# Patient Record
Sex: Female | Born: 1990 | Race: White | Hispanic: No | Marital: Married | Smoking: Never smoker
Health system: Southern US, Community
[De-identification: ages and names within clinical notes are randomized; demographics above are authoritative.]

## PROBLEM LIST (undated history)

## (undated) ENCOUNTER — Inpatient Hospital Stay: Payer: Self-pay

## (undated) DIAGNOSIS — J45909 Unspecified asthma, uncomplicated: Secondary | ICD-10-CM

## (undated) DIAGNOSIS — F329 Major depressive disorder, single episode, unspecified: Secondary | ICD-10-CM

## (undated) DIAGNOSIS — F32A Depression, unspecified: Secondary | ICD-10-CM

## (undated) DIAGNOSIS — F419 Anxiety disorder, unspecified: Secondary | ICD-10-CM

## (undated) DIAGNOSIS — T7840XA Allergy, unspecified, initial encounter: Secondary | ICD-10-CM

## (undated) HISTORY — DX: Allergy, unspecified, initial encounter: T78.40XA

## (undated) HISTORY — DX: Anxiety disorder, unspecified: F41.9

## (undated) HISTORY — DX: Major depressive disorder, single episode, unspecified: F32.9

## (undated) HISTORY — PX: NO PAST SURGERIES: SHX2092

## (undated) HISTORY — PX: ADENOIDECTOMY: SUR15

## (undated) HISTORY — DX: Unspecified asthma, uncomplicated: J45.909

## (undated) HISTORY — PX: WISDOM TOOTH EXTRACTION: SHX21

## (undated) HISTORY — DX: Depression, unspecified: F32.A

---
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
## 2012-01-09 ENCOUNTER — Observation Stay: Payer: Self-pay | Admitting: Obstetrics and Gynecology

## 2012-01-23 ENCOUNTER — Inpatient Hospital Stay: Payer: Self-pay

## 2012-01-23 LAB — CBC WITH DIFFERENTIAL/PLATELET
Basophil #: 0 10*3/uL (ref 0.0–0.1)
HCT: 38.3 % (ref 35.0–47.0)
HGB: 13 g/dL (ref 12.0–16.0)
Lymphocyte %: 7.4 %
MCH: 31.8 pg (ref 26.0–34.0)
Monocyte #: 1 x10 3/mm — ABNORMAL HIGH (ref 0.2–0.9)
Monocyte %: 6.8 %
Neutrophil #: 12.5 10*3/uL — ABNORMAL HIGH (ref 1.4–6.5)
Neutrophil %: 85.1 %
Platelet: 129 10*3/uL — ABNORMAL LOW (ref 150–440)
RDW: 13.4 % (ref 11.5–14.5)
WBC: 14.7 10*3/uL — ABNORMAL HIGH (ref 3.6–11.0)

## 2012-01-24 LAB — HEMATOCRIT: HCT: 33.9 % — ABNORMAL LOW (ref 35.0–47.0)

## 2012-01-25 LAB — CBC WITH DIFFERENTIAL/PLATELET
Basophil #: 0 10*3/uL (ref 0.0–0.1)
Eosinophil %: 1 %
HCT: 31.5 % — ABNORMAL LOW (ref 35.0–47.0)
Lymphocyte #: 1.7 10*3/uL (ref 1.0–3.6)
MCH: 33.1 pg (ref 26.0–34.0)
MCHC: 35.5 g/dL (ref 32.0–36.0)
MCV: 93 fL (ref 80–100)
Monocyte #: 1 x10 3/mm — ABNORMAL HIGH (ref 0.2–0.9)
Monocyte %: 6.9 %
Neutrophil #: 11 10*3/uL — ABNORMAL HIGH (ref 1.4–6.5)
Platelet: 120 10*3/uL — ABNORMAL LOW (ref 150–440)
RBC: 3.38 10*6/uL — ABNORMAL LOW (ref 3.80–5.20)
WBC: 13.9 10*3/uL — ABNORMAL HIGH (ref 3.6–11.0)

## 2013-07-21 ENCOUNTER — Observation Stay: Payer: Self-pay

## 2013-09-01 ENCOUNTER — Observation Stay: Payer: Self-pay | Admitting: Obstetrics and Gynecology

## 2013-09-02 ENCOUNTER — Inpatient Hospital Stay: Payer: Self-pay | Admitting: Obstetrics and Gynecology

## 2013-09-02 LAB — CBC WITH DIFFERENTIAL/PLATELET
BASOS ABS: 0 10*3/uL (ref 0.0–0.1)
Basophil %: 0.2 %
Eosinophil #: 0.1 10*3/uL (ref 0.0–0.7)
Eosinophil %: 0.7 %
HCT: 40.1 % (ref 35.0–47.0)
HGB: 13.4 g/dL (ref 12.0–16.0)
Lymphocyte #: 1.5 10*3/uL (ref 1.0–3.6)
Lymphocyte %: 9.9 %
MCH: 29.9 pg (ref 26.0–34.0)
MCHC: 33.4 g/dL (ref 32.0–36.0)
MCV: 90 fL (ref 80–100)
MONO ABS: 1.2 x10 3/mm — AB (ref 0.2–0.9)
Monocyte %: 8.3 %
Neutrophil #: 12.2 10*3/uL — ABNORMAL HIGH (ref 1.4–6.5)
Neutrophil %: 80.9 %
PLATELETS: 162 10*3/uL (ref 150–440)
RBC: 4.49 10*6/uL (ref 3.80–5.20)
RDW: 13.2 % (ref 11.5–14.5)
WBC: 15.1 10*3/uL — ABNORMAL HIGH (ref 3.6–11.0)

## 2013-09-03 LAB — HEMATOCRIT: HCT: 35.6 % (ref 35.0–47.0)

## 2014-07-03 DIAGNOSIS — O364XX0 Maternal care for intrauterine death, not applicable or unspecified: Secondary | ICD-10-CM | POA: Diagnosis not present

## 2014-07-03 DIAGNOSIS — Z3492 Encounter for supervision of normal pregnancy, unspecified, second trimester: Secondary | ICD-10-CM | POA: Diagnosis not present

## 2014-09-12 ENCOUNTER — Telehealth: Payer: Self-pay

## 2014-09-12 NOTE — Telephone Encounter (Signed)
Patient is complaining of a yeast infection under her left breast. It's clearing up a little painful. I stated to the patient she may need to come in for a office visit. Patient stated she just want to know what she can use over the counter. Please call patient at [phone]

## 2014-09-16 NOTE — Telephone Encounter (Signed)
Left msg with pt. She can try monistat topical otc fro pharmacy. Advised her if rash is worsening or if this does not help that she needs to come in to be evaluated.

## 2014-09-22 ENCOUNTER — Ambulatory Visit: Payer: Self-pay | Admitting: Family Medicine

## 2014-10-11 ENCOUNTER — Ambulatory Visit (INDEPENDENT_AMBULATORY_CARE_PROVIDER_SITE_OTHER): Payer: Managed Care, Other (non HMO) | Admitting: Family Medicine

## 2014-10-11 ENCOUNTER — Encounter: Payer: Self-pay | Admitting: Family Medicine

## 2014-10-11 VITALS — BP 103/65 | HR 63 | Temp 97.5°F | Resp 16 | Ht 67.5 in | Wt 149.0 lb

## 2014-10-11 DIAGNOSIS — F329 Major depressive disorder, single episode, unspecified: Secondary | ICD-10-CM

## 2014-10-11 DIAGNOSIS — F32A Depression, unspecified: Secondary | ICD-10-CM

## 2014-10-11 DIAGNOSIS — F418 Other specified anxiety disorders: Secondary | ICD-10-CM | POA: Diagnosis not present

## 2014-10-11 DIAGNOSIS — Z131 Encounter for screening for diabetes mellitus: Secondary | ICD-10-CM | POA: Diagnosis not present

## 2014-10-11 DIAGNOSIS — Z0000 Encounter for general adult medical examination without abnormal findings: Secondary | ICD-10-CM

## 2014-10-11 DIAGNOSIS — F419 Anxiety disorder, unspecified: Secondary | ICD-10-CM

## 2014-10-11 LAB — COMPREHENSIVE METABOLIC PANEL
ALBUMIN: 4.5 g/dL (ref 3.5–5.2)
ALT: 14 U/L (ref 0–35)
AST: 17 U/L (ref 0–37)
Alkaline Phosphatase: 80 U/L (ref 39–117)
BILIRUBIN TOTAL: 0.5 mg/dL (ref 0.2–1.2)
BUN: 11 mg/dL (ref 6–23)
CALCIUM: 9.5 mg/dL (ref 8.4–10.5)
CO2: 28 meq/L (ref 19–32)
Chloride: 104 mEq/L (ref 96–112)
Creat: 0.68 mg/dL (ref 0.50–1.10)
Glucose, Bld: 81 mg/dL (ref 70–99)
Potassium: 4.2 mEq/L (ref 3.5–5.3)
SODIUM: 141 meq/L (ref 135–145)
Total Protein: 6.9 g/dL (ref 6.0–8.3)

## 2014-10-11 LAB — CBC WITH DIFFERENTIAL/PLATELET
Basophils Absolute: 0.1 10*3/uL (ref 0.0–0.1)
Basophils Relative: 1 % (ref 0–1)
EOS ABS: 0.1 10*3/uL (ref 0.0–0.7)
Eosinophils Relative: 2 % (ref 0–5)
HCT: 41.8 % (ref 36.0–46.0)
HEMOGLOBIN: 14.5 g/dL (ref 12.0–15.0)
LYMPHS PCT: 21 % (ref 12–46)
Lymphs Abs: 1.3 10*3/uL (ref 0.7–4.0)
MCH: 29.3 pg (ref 26.0–34.0)
MCHC: 34.7 g/dL (ref 30.0–36.0)
MCV: 84.4 fL (ref 78.0–100.0)
MPV: 10.3 fL (ref 8.6–12.4)
Monocytes Absolute: 0.6 10*3/uL (ref 0.1–1.0)
Monocytes Relative: 9 % (ref 3–12)
NEUTROS PCT: 67 % (ref 43–77)
Neutro Abs: 4.2 10*3/uL (ref 1.7–7.7)
Platelets: 200 10*3/uL (ref 150–400)
RBC: 4.95 MIL/uL (ref 3.87–5.11)
RDW: 13.6 % (ref 11.5–15.5)
WBC: 6.2 10*3/uL (ref 4.0–10.5)

## 2014-10-11 LAB — VITAMIN B12: Vitamin B-12: 683 pg/mL (ref 211–911)

## 2014-10-11 LAB — HEMOGLOBIN A1C
Hgb A1c MFr Bld: 5.4 % (ref <5.7)
Mean Plasma Glucose: 108 mg/dL (ref <117)

## 2014-10-11 LAB — TSH: TSH: 1.586 u[IU]/mL (ref 0.350–4.500)

## 2014-10-11 NOTE — Progress Notes (Signed)
Subjective:    Patient ID: Mallory AnonJacqueline Craig, female    DOB: 1990/10/09, 24 y.o.   MRN: [id number]  10/11/2014  Establish Care; Annual Exam; and Anxiety   HPI This 24 y.o. female presents to establish care.  Last physical:  2015 Pap smear: 2015 Mammogram: never Colonoscopy:  never TDAP:  2013 Gardisil series: as teenager Influenza:  2014 Eye exam:  2015: +glasses Dental exam:  2015  Anxiety and depression: [facility]; OB/GYN moved.  Not a fan of Deatra RobinsonKaren Jones.  No SI/HI.  Sleeping well.  Recently decreased Zoloft from 150mg  to 100mg  two weeks ago.  Still has some bad days when argues with husband and gets upset more than she feels she should.  Denies SI/HI.  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  No local family support.  Financial strains.  Allergic Rhinitis: taking Claritin 10mg  daily. Currently breastfeeding.    Contraception:  Not interested in contraception currently.  Breastfeeding currently [age] year old.  Previous contraception nothing; condoms.       Review of Systems  Constitutional: Negative for fever, chills, diaphoresis, activity change, appetite change, fatigue and unexpected weight change.  HENT: Positive for congestion. Negative for dental problem, drooling, ear discharge, ear pain, facial swelling, hearing loss, mouth sores, nosebleeds, postnasal drip, rhinorrhea, sinus pressure, sneezing, sore throat, tinnitus, trouble swallowing and voice change.   Eyes: Negative for photophobia, pain, discharge, redness, itching and visual disturbance.  Respiratory: Negative for apnea, cough, choking, chest tightness, shortness of breath, wheezing and stridor.   Cardiovascular: Negative for chest pain, palpitations and leg swelling.  Gastrointestinal: Negative for nausea, vomiting, abdominal pain, diarrhea, constipation, blood in stool, abdominal distention, anal bleeding and rectal pain.  Endocrine: Negative for cold intolerance, heat  intolerance, polydipsia, polyphagia and polyuria.  Genitourinary: Negative for dysuria, urgency, frequency, hematuria, flank pain, decreased urine volume, vaginal bleeding, vaginal discharge, enuresis, difficulty urinating, genital sores, vaginal pain, menstrual problem, pelvic pain and dyspareunia.  Musculoskeletal: Negative for myalgias, back pain, joint swelling, arthralgias, gait problem, neck pain and neck stiffness.  Skin: Negative for color change, pallor, rash and wound.  Allergic/Immunologic: Negative for environmental allergies, food allergies and immunocompromised state.  Neurological: Negative for dizziness, tremors, seizures, syncope, facial asymmetry, speech difficulty, weakness, light-headedness, numbness and headaches.  Hematological: Negative for adenopathy. Does not bruise/bleed easily.  Psychiatric/Behavioral: Negative for suicidal ideas, hallucinations, behavioral problems, confusion, sleep disturbance, self-injury, dysphoric mood, decreased concentration and agitation. The patient is nervous/anxious. The patient is not hyperactive.     Past Medical History  Diagnosis Date  . Anxiety   . Allergy     Claritin  . 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 Asthma     Childhood; last Albuterol use age 24   Past Surgical History  Procedure Laterality Date  . Adenoidectomy     No Known Allergies Current Outpatient Prescriptions  Medication Sig Dispense Refill  . loratadine (CLARITIN) 10 MG tablet Take 10 mg by mouth daily.    . [DISCONTINUED] sertraline (ZOLOFT) 100 MG tablet Take 100 mg by mouth daily.     No current facility-administered medications for this visit.       Objective:    BP 103/65 mmHg  Pulse 63  Temp(Src) 97.5 F (36.4 C)  Resp 16  Ht 5' 7.5" (1.715 m)  Wt 149 lb (67.586 kg)  BMI 22.98 kg/m2 Physical Exam  Constitutional: She is oriented to person, place, and time. She appears  well-developed and well-nourished. No distress.    HENT:  Head: Normocephalic and atraumatic.  Right Ear: External ear normal.  Left Ear: External ear normal.  Nose: Nose normal.  Mouth/Throat: Oropharynx is clear and moist.  Eyes: Conjunctivae and EOM are normal. Pupils are equal, round, and reactive to light.  Neck: Normal range of motion and full passive range of motion without pain. Neck supple. No JVD present. Carotid bruit is not present. No thyromegaly present.  Cardiovascular: Normal rate, regular rhythm, normal heart sounds and intact distal pulses.  Exam reveals no gallop and no friction rub.   No murmur heard. Pulmonary/Chest: Effort normal and breath sounds normal. She has no wheezes. She has no rales.  Abdominal: Soft. Bowel sounds are normal. She exhibits no distension and no mass. There is no tenderness. There is no rebound and no guarding.  Musculoskeletal:       Right shoulder: Normal.       Left shoulder: Normal.       Cervical back: Normal.  Lymphadenopathy:    She has no cervical adenopathy.  Neurological: She is alert and oriented to person, place, and time. She has normal reflexes. No cranial nerve deficit. She exhibits normal muscle tone. Coordination normal.  Skin: Skin is warm and dry. No rash noted. She is not diaphoretic. No erythema. No pallor.  Psychiatric: She has a normal mood and affect. Her behavior is normal. Judgment and thought content normal.  Nursing note and vitals reviewed.       Assessment & Plan:   1. Routine physical examination   2. Screening for diabetes mellitus   3. Anxiety and depression     1. Complete Physical Examination: anticipatory guidance --- exercise, weight maintenance. Immunizations UTD.  Not interested in contraception. Pap smear UTD per gynecology.   2. Screening DMII: obtain labs.   3.  Anxiety and depression: stable on Zoloft  daily; recommend continuing current dose for two to three months.  Can consider weaning to  daily if doing well after three months;  obtain labs including TSH, B12, vitamin D levels.  Encourage regular exercise for stress management.  Pt to call when needs refill of Zoloft. Follow-up in four months.    Meds ordered this encounter  Medications  . DISCONTD: sertraline (ZOLOFT) 100 MG tablet    Sig: Take 100 mg by mouth daily.  Marland Kitchen loratadine (CLARITIN) 10 MG tablet    Sig: Take 10 mg by mouth daily.    Return in about 4 months (around 02/10/2015) for recheck anxiety.   Ryszard Socarras Paulita Fujita, M.D. [facility] [address] [place], [place]  [address] [phone] phone [phone] fax

## 2014-10-11 NOTE — Patient Instructions (Signed)

## 2014-10-12 LAB — VITAMIN D 25 HYDROXY (VIT D DEFICIENCY, FRACTURES): Vit D, 25-Hydroxy: 34 ng/mL (ref 30–100)

## 2014-11-09 NOTE — H&P (Signed)
L&D Evaluation:  History:  HPI 24 y/o G2P1001 @ 38+wks EDC 09/09/13 arrives with strong contractions, denies leaking fluid or vaginal bleeding, baby is active. [facility], close interconceptual spacing. HX Depression. GBS negative.   Presents with contractions   Patient's Medical History No Chronic Illness   Patient's Surgical History none   Medications Pre Natal Vitamins  zoloft 100mg  qd   Allergies macrobid   Social History none   Family History Non-Contributory   ROS:  ROS All systems were reviewed.  HEENT, CNS, GI, GU, Respiratory, CV, Renal and Musculoskeletal systems were found to be normal.   Exam:  Vital Signs stable   Urine Protein not completed   General no apparent distress   Mental Status clear   Chest clear   Heart normal sinus rhythm   Abdomen gravid, non-tender   Estimated Fetal Weight Average for gestational age   Fetal Position vtx   Fundal Height term   Back no CVAT   Edema no edema   Reflexes 2+   Clonus negative   Pelvic no external lesions, 8cm upon arrival rapid progress to complete vtx @ 1+ station nl show SROM clear fluid with 2nd stage   Mebranes Ruptured   Description clear   FHT normal rate with no decels, baseline 130's avg variability with accels   Fetal Heart Rate 136   Ucx regular   Ucx Frequency 2 min   Length of each Contraction 60 seconds   Skin dry   Lymph no lymphadenopathy   Impression:  Impression active labor   Plan:  Plan EFM/NST, monitor contractions and for cervical change   Comments Precipitous delivery see Delivery summary.   Electronic Signatures: Albertina ParrLugiano, Yvonna Brun B (CNM)  (Signed 04-Mar-15 13:03)  Authored: L&D Evaluation   Last Updated: 04-Mar-15 13:03 by Albertina ParrLugiano, Ambriana Selway B (CNM)

## 2014-11-09 NOTE — H&P (Signed)
L&D Evaluation:  History:   HPI 24 yo G1P0 with LMP of ? & ED[facility] 02/01/[facility], severe Anxiety, Severe difficulty with pelvic exams but, denies sexual abuse hx. Presented today for back pain, no burning, no urgency, no frequency, NO ROM, No Decreased FM, +UC's?, . Pt is currently working OT pushing herself.    Presents with back pain    Patient's Medical History Anxiety, chronic sinusitis, asthma    Patient's Surgical History Adenoidectomy    Medications Pre Natal Vitamins    Allergies NKDA    Social History none    Family History Non-Contributory   ROS:   ROS All systems were reviewed.  HEENT, CNS, GI, GU, Respiratory, CV, Renal and Musculoskeletal systems were found to be normal.   Exam:   Vital Signs stable    General no apparent distress    Mental Status clear  Sleeping soundly    Chest clear    Heart normal sinus rhythm    Abdomen gravid, non-tender    Estimated Fetal Weight Average for gestational age    Back no CVAT    Edema 1+    Reflexes 1+    Pelvic Pt refused exam    Mebranes Intact    FHT Description reactive NST    Ucx absent   Impression:   Impression UTI   Plan:   Plan DC home to rest, hydrate and Macrobid RX   Electronic Signatures: Sharee PimpleJones, Areonna Bran W (CNM)  (Signed 21-Jun-13 09:13)  Authored: L&D Evaluation   Last Updated: 21-Jun-13 09:13 by Sharee PimpleJones, Amoni Scallan W (CNM)

## 2014-11-09 NOTE — H&P (Signed)
L&D Evaluation:  History:   HPI 24 [facility] regular [facility], unsure? leeaking fluid small show, baby is active. Well pregnancy, Anxiety with pelvic exams (support offered has improved slightly during course of pregnancy) GBS negative.    Presents with contractions    Patient's Medical History No Chronic Illness    Patient's Surgical History none    Medications Pre Natal Vitamins    Allergies NKDA    Social History none    Family History Non-Contributory   ROS:   ROS All systems were reviewed.  HEENT, CNS, GI, GU, Respiratory, CV, Renal and Musculoskeletal systems were found to be normal.   Exam:   Vital Signs stable    Urine Protein negative dipstick    General no apparent distress    Mental Status clear    Chest clear    Heart normal sinus rhythm    Abdomen gravid, non-tender    Estimated Fetal Weight Average for gestational age    Fetal Position vtx    Fundal Height term    Back no CVAT    Edema 1+  pedal    Reflexes 2+    Clonus negative    Pelvic no external lesions, 4-5cm 90% vtx @ -1 station BOWI small bloody show    Mebranes Intact    FHT normal rate with no decels, baseline 130's avg variability with accels    Fetal Heart Rate 136    Ucx irregular, Q 2/3   4/6 mins    Skin dry    Lymph no lymphadenopathy   Impression:   Impression active labor   Plan:   Plan monitor contractions and for cervical change    Comments Admitted, explained what to expect with first baby, questions answered. Support offered, doing fairly well with relaxation. Requests epidural anesthesia notified. FOB supportive at bedside.   Electronic Signatures: Albertina ParrLugiano, Calixto Pavel B (CNM)  (Signed 24-Jul-13 12:26)  Authored: L&D Evaluation   Last Updated: 24-Jul-13 12:26 by Albertina ParrLugiano, Veldon Wager B (CNM)

## 2014-11-30 ENCOUNTER — Ambulatory Visit (INDEPENDENT_AMBULATORY_CARE_PROVIDER_SITE_OTHER): Payer: Managed Care, Other (non HMO) | Admitting: Urgent Care

## 2014-11-30 ENCOUNTER — Emergency Department (HOSPITAL_COMMUNITY)
Admission: EM | Admit: 2014-11-30 | Discharge: 2014-12-01 | Disposition: A | Discharge status: Home / Self Care | Payer: Managed Care, Other (non HMO) | Attending: Emergency Medicine | Admitting: Emergency Medicine

## 2014-11-30 ENCOUNTER — Encounter (HOSPITAL_COMMUNITY): Payer: Self-pay

## 2014-11-30 ENCOUNTER — Emergency Department (HOSPITAL_COMMUNITY): Payer: Managed Care, Other (non HMO)

## 2014-11-30 VITALS — BP 110/60 | HR 110 | Temp 97.8°F | Ht 66.0 in | Wt 152.5 lb

## 2014-11-30 DIAGNOSIS — Z9089 Acquired absence of other organs: Secondary | ICD-10-CM | POA: Insufficient documentation

## 2014-11-30 DIAGNOSIS — K5000 Crohn's disease of small intestine without complications: Secondary | ICD-10-CM | POA: Diagnosis not present

## 2014-11-30 DIAGNOSIS — R110 Nausea: Secondary | ICD-10-CM

## 2014-11-30 DIAGNOSIS — R100 Acute abdomen: Secondary | ICD-10-CM | POA: Diagnosis not present

## 2014-11-30 DIAGNOSIS — N898 Other specified noninflammatory disorders of vagina: Secondary | ICD-10-CM | POA: Insufficient documentation

## 2014-11-30 DIAGNOSIS — R1084 Generalized abdominal pain: Secondary | ICD-10-CM | POA: Diagnosis not present

## 2014-11-30 DIAGNOSIS — Z79899 Other long term (current) drug therapy: Secondary | ICD-10-CM | POA: Diagnosis not present

## 2014-11-30 DIAGNOSIS — Z3202 Encounter for pregnancy test, result negative: Secondary | ICD-10-CM | POA: Insufficient documentation

## 2014-11-30 DIAGNOSIS — R1031 Right lower quadrant pain: Secondary | ICD-10-CM | POA: Diagnosis not present

## 2014-11-30 DIAGNOSIS — R102 Pelvic and perineal pain: Secondary | ICD-10-CM

## 2014-11-30 DIAGNOSIS — R000 Tachycardia, unspecified: Secondary | ICD-10-CM | POA: Diagnosis not present

## 2014-11-30 DIAGNOSIS — J45909 Unspecified asthma, uncomplicated: Secondary | ICD-10-CM | POA: Insufficient documentation

## 2014-11-30 DIAGNOSIS — F329 Major depressive disorder, single episode, unspecified: Secondary | ICD-10-CM | POA: Diagnosis not present

## 2014-11-30 DIAGNOSIS — R109 Unspecified abdominal pain: Secondary | ICD-10-CM

## 2014-11-30 DIAGNOSIS — F419 Anxiety disorder, unspecified: Secondary | ICD-10-CM | POA: Insufficient documentation

## 2014-11-30 LAB — CBC WITH DIFFERENTIAL/PLATELET
Basophils Absolute: 0 10*3/uL (ref 0.0–0.1)
Basophils Relative: 0 % (ref 0–1)
Eosinophils Absolute: 0.1 10*3/uL (ref 0.0–0.7)
Eosinophils Relative: 1 % (ref 0–5)
HCT: 43.7 % (ref 36.0–46.0)
Hemoglobin: 14.4 g/dL (ref 12.0–15.0)
Lymphocytes Relative: 6 % — ABNORMAL LOW (ref 12–46)
Lymphs Abs: 0.7 10*3/uL (ref 0.7–4.0)
MCH: 28.6 pg (ref 26.0–34.0)
MCHC: 33 g/dL (ref 30.0–36.0)
MCV: 86.9 fL (ref 78.0–100.0)
Monocytes Absolute: 0.6 10*3/uL (ref 0.1–1.0)
Monocytes Relative: 5 % (ref 3–12)
Neutro Abs: 10.7 10*3/uL — ABNORMAL HIGH (ref 1.7–7.7)
Neutrophils Relative %: 88 % — ABNORMAL HIGH (ref 43–77)
Platelets: ADEQUATE 10*3/uL (ref 150–400)
RBC: 5.03 MIL/uL (ref 3.87–5.11)
RDW: 13.4 % (ref 11.5–15.5)
WBC: 12.1 10*3/uL — ABNORMAL HIGH (ref 4.0–10.5)

## 2014-11-30 LAB — URINALYSIS, ROUTINE W REFLEX MICROSCOPIC
Bilirubin Urine: NEGATIVE
GLUCOSE, UA: NEGATIVE mg/dL
Hgb urine dipstick: NEGATIVE
Ketones, ur: NEGATIVE mg/dL
LEUKOCYTES UA: NEGATIVE
Nitrite: NEGATIVE
PROTEIN: NEGATIVE mg/dL
Specific Gravity, Urine: 1.02 (ref 1.005–1.030)
UROBILINOGEN UA: 0.2 mg/dL (ref 0.0–1.0)
pH: 8.5 — ABNORMAL HIGH (ref 5.0–8.0)

## 2014-11-30 LAB — POCT UA - MICROSCOPIC ONLY
Casts, Ur, LPF, POC: NEGATIVE
Crystals, Ur, HPF, POC: NEGATIVE
MUCUS UA: NEGATIVE
YEAST UA: NEGATIVE

## 2014-11-30 LAB — POCT URINALYSIS DIPSTICK
Bilirubin, UA: NEGATIVE
Glucose, UA: NEGATIVE
KETONES UA: NEGATIVE
Leukocytes, UA: NEGATIVE
Nitrite, UA: NEGATIVE
RBC UA: NEGATIVE
SPEC GRAV UA: 1.015
UROBILINOGEN UA: 0.2
pH, UA: 8.5

## 2014-11-30 LAB — COMPREHENSIVE METABOLIC PANEL
ALBUMIN: 3.9 g/dL (ref 3.5–5.0)
ALT: 11 U/L — AB (ref 14–54)
AST: 28 U/L (ref 15–41)
Alkaline Phosphatase: 70 U/L (ref 38–126)
Anion gap: 9 (ref 5–15)
BUN: 16 mg/dL (ref 6–20)
CALCIUM: 9 mg/dL (ref 8.9–10.3)
CHLORIDE: 107 mmol/L (ref 101–111)
CO2: 23 mmol/L (ref 22–32)
CREATININE: 0.84 mg/dL (ref 0.44–1.00)
GFR calc Af Amer: >60 mL/min (ref >60)
GFR calc non Af Amer: >60 mL/min (ref >60)
Glucose, Bld: 97 mg/dL (ref 65–99)
Potassium: 4.8 mmol/L (ref 3.5–5.1)
Sodium: 139 mmol/L (ref 135–145)
Total Bilirubin: 0.7 mg/dL (ref 0.3–1.2)
Total Protein: 6.4 g/dL — ABNORMAL LOW (ref 6.5–8.1)

## 2014-11-30 LAB — POCT CBC
GRANULOCYTE PERCENT: 90.6 % — AB (ref 37–80)
HCT, POC: 43.1 % (ref 37.7–47.9)
HEMOGLOBIN: 14.4 g/dL (ref 12.2–16.2)
Lymph, poc: 0.8 (ref 0.6–3.4)
MCH, POC: 28.4 pg (ref 27–31.2)
MCHC: 33.4 g/dL (ref 31.8–35.4)
MCV: 85.1 fL (ref 80–97)
MID (cbc): 0.4 (ref 0–0.9)
MPV: 7.4 fL (ref 0–99.8)
POC Granulocyte: 12 — AB (ref 2–6.9)
POC LYMPH PERCENT: 6.2 %L — AB (ref 10–50)
POC MID %: 3.2 %M (ref 0–12)
Platelet Count, POC: 192 10*3/uL (ref 142–424)
RBC: 5.06 M/uL (ref 4.04–5.48)
RDW, POC: 13.7 %
WBC: 13.2 10*3/uL — AB (ref 4.6–10.2)

## 2014-11-30 LAB — POC URINE PREG, ED: Preg Test, Ur: NEGATIVE

## 2014-11-30 MED ORDER — HYDROMORPHONE HCL 1 MG/ML IJ SOLN
1.0000 mg | Freq: Once | INTRAMUSCULAR | Status: AC
  Administered 2014-11-30: 1 mg via INTRAVENOUS
  Filled 2014-11-30: qty 1

## 2014-11-30 MED ORDER — ONDANSETRON HCL 4 MG/2ML IJ SOLN
4.0000 mg | Freq: Once | INTRAMUSCULAR | Status: AC
  Administered 2014-11-30: 4 mg via INTRAVENOUS
  Filled 2014-11-30: qty 2

## 2014-11-30 MED ORDER — IOHEXOL 300 MG/ML  SOLN
100.0000 mL | Freq: Once | INTRAMUSCULAR | Status: AC | PRN
Start: 2014-11-30 — End: 2014-11-30

## 2014-11-30 MED ORDER — SODIUM CHLORIDE 0.9 % IV BOLUS (SEPSIS)
1000.0000 mL | Freq: Once | INTRAVENOUS | Status: AC
Start: 2014-11-30 — End: 2014-12-01
  Administered 2014-11-30: 1000 mL via INTRAVENOUS

## 2014-11-30 NOTE — ED Provider Notes (Signed)
CSN: [id number]     Arrival date & time 11/30/14  2138 History   First MD Initiated Contact with Patient 11/30/14 2154     Chief Complaint  Patient presents with  . Abdominal Pain     (Consider location/radiation/quality/duration/timing/severity/associated sxs/prior Treatment) HPI Comments: Patient presents with complaint of right lower quadrant abdominal pain starting approximate 4 PM today. Pain was mild at onset but is gradually worsening. Pain is described as sharp. It does not radiate. Patient went to an outside urgent care and was sent to the emergency department for evaluation of appendicitis versus pelvic etiology. No fever. Patient has had nausea but no vomiting. She had one loose non-bloody stool at onset. No urinary symptoms. Patient has had some minor white vaginal discharge. She did not have a pelvic exam at urgent care. No history of abdominal surgeries. The onset of this condition was acute. The course is constant. Aggravating factors: palpation. Alleviating factors: none.    The history is provided by the patient and medical records.    Past Medical History  Diagnosis Date  . Anxiety   . Allergy     Claritin  . 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 Asthma     Childhood; last Albuterol use age 24   Past Surgical History  Procedure Laterality Date  . Adenoidectomy     Family History  Problem Relation Age of Onset  . Cancer Mother     skin cancer  . Arthritis Mother   . Hyperthyroidism Mother   . Cancer Maternal Grandmother   . Cancer Maternal Grandfather   . Asthma Brother    History  Substance Use Topics  . Smoking status: Never Smoker   . Smokeless tobacco: Not on file  . Alcohol Use: No   OB History    No data available     Review of Systems  Constitutional: Negative for fever.  HENT: Negative for rhinorrhea and sore throat.   Eyes: Negative for redness.  Respiratory: Negative for cough.   Cardiovascular: Negative for chest  pain.  Gastrointestinal: Positive for nausea, abdominal pain and diarrhea. Negative for vomiting.  Genitourinary: Positive for vaginal discharge. Negative for dysuria, vaginal bleeding and pelvic pain.  Musculoskeletal: Negative for myalgias.  Skin: Negative for rash.  Neurological: Negative for headaches.      Allergies  Nitrofurantoin  Home Medications   Prior to Admission medications   Medication Sig Start Date End Date Taking? Authorizing Provider  loratadine (CLARITIN) 10 MG tablet Take 10 mg by mouth daily.    Historical Provider, MD  sertraline (ZOLOFT) 100 MG tablet Take 100 mg by mouth daily. 09/20/14   Historical Provider, MD   BP 112/70 mmHg  Pulse 108  Temp(Src) 97.8 F (36.6 C) (Oral)  Resp 18  SpO2 100%  LMP 10/26/2014 (Approximate)   Physical Exam  Constitutional: She appears well-developed and well-nourished. She appears distressed (Appears uncomfortable.).  HENT:  Head: Normocephalic and atraumatic.  Mouth/Throat: Oropharynx is clear and moist.  Eyes: Conjunctivae are normal. Right eye exhibits no discharge. Left eye exhibits no discharge.  Neck: Normal range of motion. Neck supple.  Cardiovascular: Regular rhythm and normal heart sounds.  Tachycardia present.   No murmur heard. Mild tachycardia  Pulmonary/Chest: Effort normal and breath sounds normal. No respiratory distress. She has no wheezes. She has no rales.  Abdominal: Soft. Bowel sounds are decreased. There is tenderness in the right upper quadrant, right lower quadrant, periumbilical area and suprapubic area.  There is no rebound, no guarding and no CVA tenderness.    Neurological: She is alert.  Skin: Skin is warm and dry.  Psychiatric: She has a normal mood and affect.  Nursing note and vitals reviewed.   ED Course  Procedures (including critical care time) Labs Review Labs Reviewed  CBC WITH DIFFERENTIAL/PLATELET - Abnormal; Notable for the following:    WBC 12.1 (*)    Neutrophils  Relative % 88 (*)    Lymphocytes Relative 6 (*)    Neutro Abs 10.7 (*)    All other components within normal limits  COMPREHENSIVE METABOLIC PANEL - Abnormal; Notable for the following:    Total Protein 6.4 (*)    ALT 11 (*)    All other components within normal limits  URINALYSIS, ROUTINE W REFLEX MICROSCOPIC (NOT AT [facility]) - Abnormal; Notable for the following:    pH 8.5 (*)    All other components within normal limits  POC URINE PREG, ED    Imaging Review Ct Abdomen Pelvis W Contrast  12/01/2014   CLINICAL DATA:  Abdominal pain, chills.  EXAM: CT ABDOMEN AND PELVIS WITH CONTRAST  TECHNIQUE: Multidetector CT imaging of the abdomen and pelvis was performed using the standard protocol following bolus administration of intravenous contrast.  CONTRAST:  OMNIPAQUE IOHEXOL 300 MG/ML  SOLN  COMPARISON:  None.  FINDINGS: Mild atelectasis in the dependent lung bases. Asymmetric soft tissue density in the included portion of the breasts, right greater than left.  The liver, gallbladder, spleen, pancreas, and adrenal glands are normal. The kidneys demonstrate symmetric enhancement without hydronephrosis or localizing abnormality.  Mild gastric distention with ingested contrast and enteric contents. Normally positioned ligament of Treitz. There is fluid distending the terminal ileum with questionable adjacent wall thickening. There are otherwise no dilated or thickened small bowel loops. The appendix is air-filled and normal. Small volume of colonic stool without colonic wall thickening. There is no pericolonic inflammatory change. No free air, free fluid, or intra-abdominal fluid collection. Small lymph nodes in the ileocolic chain.  No retroperitoneal adenopathy. Abdominal aorta is normal in caliber. There is a small fat containing umbilical and supraumbilical hernia.  Within the pelvis the bladder is physiologically distended. Uterus is normal in size, there is prominent periuterine and adnexal  vascularity. Ovaries are not definitively defined. There is dilatation of the left greater than right gonadal vein. Question small amount pelvic free fluid. No pelvic adenopathy.  There are no acute or suspicious osseous abnormalities. Small Schmorl's nodes in the lumbar spine. Mild broad-based leftward curvature of the lumbar spine.  IMPRESSION: 1. Fluid distending the terminal ileum with questionable adjacent wall thickening. This can be seen in the setting have Crohn's disease. There is otherwise no bowel inflammation. The appendix is normal. Small lymph nodes in the ileocolic chain, likely reactive. 2. Increased pelvic vascularity at dilatation of the ovarian veins, suggestive of pelvic congestion syndrome.   Electronically Signed   By: Rubye Oaks M.D.   On: 12/01/2014 00:40     EKG Interpretation None      10:02 PM Patient seen and examined. Work-up initiated. Medications ordered. Patient adamantly refuses pelvic exam at this time stating she is in too much pain and that she "normally has trouble with these anyway". CT pending.   Vital signs reviewed and are as follows: BP 112/70 mmHg  Pulse 108  Temp(Src) 97.8 F (36.6 C) (Oral)  Resp 18  SpO2 100%  LMP 10/26/2014 (Approximate)  1:00 AM Patient was  more comfortable but pain now returning. Additional IV zofran and PO percocet ordered. Patient will need continued attempts at symptom control. She has finding on CT which explains her symptoms well. Do not feel that pelvic exam is necessary at this point -- and patient also continues to decline this.   1:29 AM Handoff to Manus Rudd NP at shift change. Plan: Reassess after oral pain medications, nausea medications, PO trial. If she is well enough for d/c -- she can be sent home with GI f/u and pain/nausea medication. If not improved, admit for symptom control. She remains tachycardic.  BP 110/53 mmHg  Pulse 112  Temp(Src) 99.1 F (37.3 C) (Oral)  Resp 18  SpO2 100%  LMP 10/26/2014  (Approximate)   MDM   Final diagnoses:  Abdominal pain  Ileitis, terminal, without complications   Work-up as above, pending sx control. No evidence of pelvic pathology. Ileitis of unknown etiology, possible Crohn's, is most likely explanation of symptoms.     Renne Crigler, PA-C 12/01/14 1610  Rolan Bucco, MD 12/03/14 [phone]

## 2014-11-30 NOTE — ED Notes (Signed)
Pt presents with c/o abdominal pain that started today. Pt reports she went to UC and was sent over here for possible appendicitis. Pt denies any vomiting but does report nausea.

## 2014-11-30 NOTE — Progress Notes (Signed)
MRN: [id number] DOB: March 09, 1991  Subjective:   Mallory Craig is a 24 y.o. female presenting for chief complaint of Abdominal Pain; Chills; and Nausea  Reports onset of acute RLQ abdominal pain at ~16:00 today. Patient reports pain is severe, constant, sharp, non-radiating but also admits diffuse abdominal pain, pelvic pain R>L. Also has nausea, vaginal discharge which is thick and white. Has tried to stay hydrated, has eaten without vomiting. Denies fevers, dysuria, hematuria, diarrhea, chest pain. Denies any other aggravating or relieving factors, no other questions or concerns.  Mallory Craig has a current medication list which includes the following prescription(s): sertraline and loratadine. She is allergic to nitrofurantoin.  Mallory Craig  has a past medical history of Anxiety; Allergy; Depression; and Asthma. Also  has past surgical history that includes Adenoidectomy.  ROS As in subjective.  Objective:   Vitals: BP 110/60 mmHg  Pulse 110  Temp(Src) 97.8 F (36.6 C) (Oral)  Ht 5\' 6"  (1.676 m)  Wt 152 lb 8 oz (69.174 kg)  BMI 24.63 kg/m2  SpO2 99%  Physical Exam  Constitutional: She appears well-developed. She appears distressed.  Cardiovascular: Regular rhythm and intact distal pulses.  Tachycardia present.  Exam reveals no gallop and no friction rub.   No murmur heard. Pulmonary/Chest: No respiratory distress. She has no wheezes. She has no rales. She exhibits no tenderness.  Abdominal: Soft. Bowel sounds are normal. She exhibits no distension. There is generalized tenderness and tenderness in the right lower quadrant. There is tenderness at McBurney's point. There is negative Murphy's sign.      Results for orders placed or performed in visit on 11/30/14 (from the past 24 hour(s))  POCT CBC     Status: Abnormal   Collection Time: 11/30/14  8:53 PM  Result Value Ref Range   WBC 13.2 (A) 4.6 - 10.2 K/uL   Lymph, poc 0.8 0.6 - 3.4   POC LYMPH PERCENT 6.2 (A) 10 - 50  %L   MID (cbc) 0.4 0 - 0.9   POC MID % 3.2 0 - 12 %M   POC Granulocyte 12.0 (A) 2 - 6.9   Granulocyte percent 90.6 (A) 37 - 80 %G   RBC 5.06 4.04 - 5.48 M/uL   Hemoglobin 14.4 12.2 - 16.2 g/dL   HCT, POC 62.143.1 30.837.7 - 47.9 %   MCV 85.1 80 - 97 fL   MCH, POC 28.4 27 - 31.2 pg   MCHC 33.4 31.8 - 35.4 g/dL   RDW, POC 65.713.7 %   Platelet Count, POC 192 142 - 424 K/uL   MPV 7.4 0 - 99.8 fL  POCT UA - Microscopic Only     Status: None   Collection Time: 11/30/14  8:53 PM  Result Value Ref Range   WBC, Ur, HPF, POC 0-1    RBC, urine, microscopic 0-2    Bacteria, U Microscopic trace    Mucus, UA neg    Epithelial cells, urine per micros 1-4    Crystals, Ur, HPF, POC neg    Casts, Ur, LPF, POC neg    Yeast, UA neg   POCT urinalysis dipstick     Status: None   Collection Time: 11/30/14  8:53 PM  Result Value Ref Range   Color, UA yellow    Clarity, UA hazy    Glucose, UA neg    Bilirubin, UA neg    Ketones, UA neg    Spec Grav, UA 1.015    Blood, UA neg    pH,  UA 8.5    Protein, UA trace    Urobilinogen, UA 0.2    Nitrite, UA neg    Leukocytes, UA Negative    Assessment and Plan :   1. Acute abdomen 2. Right lower quadrant abdominal pain 3. Generalized abdominal pain 4. Pelvic pain in female 5. Nausea without vomiting - Physical exam findings worrisome for appendicitis, contacted charge nurse Victorino Dike) at [facility] to present case, stated that they would see her emergently for CT abdomen and further evaluation. - Discussed differential with patient which included most likely appendicitis, labs pending for r/o PID, consider ovarian torsion, ovarian cyst.  - Follow up with lab results.  Wallis Bamberg, PA-C [facility] [phone] 11/30/2014 8:59 PM

## 2014-11-30 NOTE — Patient Instructions (Signed)
-  PLEASE EVALUATE PATIENT EMERGENTLY FOR ACUTE ABDOMEN, LIKELY APPENDICITIS  - Report to [facility] ED for emergent evaluation. The address is: - Address: [address], [place], [place] [address] - Phone: [phone]  Appendicitis Appendicitis is when the appendix is swollen (inflamed). The inflammation can lead to developing a hole (perforation) and a collection of pus (abscess). CAUSES  There is not always an obvious cause of appendicitis. Sometimes it is caused by an obstruction in the appendix. The obstruction can be caused by:  A small, hard, pea-sized ball of stool (fecalith).  Enlarged lymph glands in the appendix. SYMPTOMS   Pain around your belly button (navel) that moves toward your lower right belly (abdomen). The pain can become more severe and sharp as time passes.  Tenderness in the lower right abdomen. Pain gets worse if you cough or make a sudden movement.  Feeling sick to your stomach (nauseous).  Throwing up (vomiting).  Loss of appetite.  Fever.  Constipation.  Diarrhea.  Generally not feeling well. DIAGNOSIS   Physical exam.  Blood tests.  Urine test.  X-rays or a CT scan may confirm the diagnosis. TREATMENT  Once the diagnosis of appendicitis is made, the most common treatment is to remove the appendix as soon as possible. This procedure is called appendectomy. In an open appendectomy, a cut (incision) is made in the lower right abdomen and the appendix is removed. In a laparoscopic appendectomy, usually 3 small incisions are made. Long, thin instruments and a camera tube are used to remove the appendix. Most patients go home in 24 to 48 hours after appendectomy. In some situations, the appendix may have already perforated and an abscess may have formed. The abscess may have a "wall" around it as seen on a CT scan. In this case, a drain may be placed into the abscess to remove fluid, and you may be treated with antibiotic medicines that kill germs. The  medicine is given through a tube in your vein (IV). Once the abscess has resolved, it may or may not be necessary to have an appendectomy. You may need to stay in the hospital longer than 48 hours. Document Released: 06/18/2005 Document Revised: 12/18/2011 Document Reviewed: 09/13/2009 [facility] Patient Information 2015 [place] [place]. This information is not intended to replace advice given to you by your health care provider. Make sure you discuss any questions you have with your health care provider.

## 2014-12-01 ENCOUNTER — Encounter: Payer: Self-pay | Admitting: Urgent Care

## 2014-12-01 LAB — COMPREHENSIVE METABOLIC PANEL
ALBUMIN: 3.9 g/dL (ref 3.5–5.2)
ALK PHOS: 74 U/L (ref 39–117)
ALT: 11 U/L (ref 0–35)
AST: 16 U/L (ref 0–37)
BUN: 15 mg/dL (ref 6–23)
CALCIUM: 8.7 mg/dL (ref 8.4–10.5)
CO2: 28 mEq/L (ref 19–32)
Chloride: 106 mEq/L (ref 96–112)
Creat: 0.85 mg/dL (ref 0.50–1.10)
Glucose, Bld: 93 mg/dL (ref 70–99)
Potassium: 4.5 mEq/L (ref 3.5–5.3)
Sodium: 142 mEq/L (ref 135–145)
Total Bilirubin: 0.4 mg/dL (ref 0.2–1.2)
Total Protein: 5.7 g/dL — ABNORMAL LOW (ref 6.0–8.3)

## 2014-12-01 LAB — LIPASE: LIPASE: 27 U/L (ref 0–75)

## 2014-12-01 MED ORDER — ONDANSETRON 8 MG PO TBDP: 8.0000 mg | ORAL_TABLET | Freq: Three times a day (TID) | ORAL | Status: DC | PRN

## 2014-12-01 MED ORDER — KETOROLAC TROMETHAMINE 10 MG PO TABS: 10.0000 mg | ORAL_TABLET | Freq: Four times a day (QID) | ORAL | Status: DC | PRN

## 2014-12-01 MED ORDER — HYDROMORPHONE HCL 1 MG/ML IJ SOLN
1.0000 mg | Freq: Once | INTRAMUSCULAR | Status: AC
  Administered 2014-12-01: 1 mg via INTRAVENOUS
  Filled 2014-12-01: qty 1

## 2014-12-01 MED ORDER — IOHEXOL 300 MG/ML  SOLN
100.0000 mL | Freq: Once | INTRAMUSCULAR | Status: AC | PRN
Start: 2014-12-01 — End: 2014-12-01
  Administered 2014-12-01: 100 mL via INTRAVENOUS

## 2014-12-01 MED ORDER — HYDROCODONE-ACETAMINOPHEN 5-325 MG PO TABS
1.0000 | ORAL_TABLET | Freq: Once | ORAL | Status: DC
  Filled 2014-12-01: qty 1

## 2014-12-01 MED ORDER — HYDROCODONE-ACETAMINOPHEN 5-325 MG PO TABS: 1.0000 | ORAL_TABLET | Freq: Four times a day (QID) | ORAL | Status: DC | PRN

## 2014-12-01 MED ORDER — ONDANSETRON HCL 4 MG/2ML IJ SOLN
4.0000 mg | Freq: Once | INTRAMUSCULAR | Status: AC
  Administered 2014-12-01: 4 mg via INTRAVENOUS
  Filled 2014-12-01: qty 2

## 2014-12-01 MED ORDER — KETOROLAC TROMETHAMINE 30 MG/ML IJ SOLN
30.0000 mg | Freq: Once | INTRAMUSCULAR | Status: AC
  Administered 2014-12-01: 30 mg via INTRAVENOUS
  Filled 2014-12-01: qty 1

## 2014-12-01 MED ORDER — ONDANSETRON 4 MG PO TBDP
4.0000 mg | ORAL_TABLET | Freq: Once | ORAL | Status: DC
  Filled 2014-12-01: qty 1

## 2014-12-01 MED ORDER — OXYCODONE-ACETAMINOPHEN 5-325 MG PO TABS
2.0000 | ORAL_TABLET | Freq: Once | ORAL | Status: AC
  Administered 2014-12-01: 2 via ORAL
  Filled 2014-12-01: qty 2

## 2014-12-01 NOTE — Discharge Instructions (Signed)
Abdominal Pain Many things can cause belly (abdominal) pain. Most times, the belly pain is not dangerous. Many cases of belly pain can be watched and treated at home. HOME CARE   Do not take medicines that help you go poop (laxatives) unless told to by your doctor.  Only take medicine as told by your doctor.  Eat or drink as told by your doctor. Your doctor will tell you if you should be on a special diet. GET HELP IF:  You do not know what is causing your belly pain.  You have belly pain while you are sick to your stomach (nauseous) or have runny poop (diarrhea).  You have pain while you pee or poop.  Your belly pain wakes you up at night.  You have belly pain that gets worse or better when you eat.  You have belly pain that gets worse when you eat fatty foods.  You have a fever. GET HELP RIGHT AWAY IF:   The pain does not go away within 2 hours.  You keep throwing up (vomiting).  The pain changes and is only in the right or left part of the belly.  You have bloody or tarry looking poop. MAKE SURE YOU:   Understand these instructions.  Will watch your condition.  Will get help right away if you are not doing well or get worse. Document Released: 12/05/2007 Document Revised: 06/23/2013 Document Reviewed: 02/25/2013 [facility] Patient Information 2015 [place], [place]. This information is not intended to replace advice given to you by your health care provider. Make sure you discuss any questions you have with your health care provider. Your CT scan, shows that you have slight irritation of the terminal ileum which is a part of the large intestines.  This will need to be further evaluated by gastroenterology U [facility].  Please make an appointment. If you develop new symptoms, worsening symptoms.  Patient to the emergency department.  Further evaluation

## 2014-12-01 NOTE — ED Notes (Signed)
Pt offered a wheel chair to be discharged to car, pt stated she could walk and refused wheelchair.

## 2014-12-01 NOTE — ED Notes (Signed)
Pt felt nauseated, dizzy and diaphoretic when she got up to go to the bathroom prior to discharge, pt readmitted into room 1

## 2014-12-01 NOTE — ED Notes (Signed)
Patient states "I took a couple sips" when asked how she was doing with her sprite. Patient states "I still just dont feel good".

## 2014-12-01 NOTE — ED Provider Notes (Signed)
Patient has been resting comfortably for the past several hours.  She was given IV Toradol, which seemed to help her discomfort greatly.  She has had no further episodes of feeling nauseated.  She been tolerating fluids.  She will be discharged home with prescription for Toradol and a [facility]  Earley FavorGail Smrithi Pigford, NP 12/01/14 [phone]

## 2014-12-01 NOTE — ED Notes (Signed)
Pt given sprite 

## 2014-12-01 NOTE — ED Notes (Signed)
Patient c/o "severe dizziness". PA notified.

## 2014-12-01 NOTE — ED Notes (Signed)
PA at bedside.

## 2014-12-03 LAB — GC/CHLAMYDIA PROBE AMP
CT Probe RNA: NEGATIVE
GC Probe RNA: NEGATIVE

## 2015-02-14 ENCOUNTER — Encounter: Payer: Self-pay | Admitting: Family Medicine

## 2015-02-14 ENCOUNTER — Ambulatory Visit (INDEPENDENT_AMBULATORY_CARE_PROVIDER_SITE_OTHER): Payer: Managed Care, Other (non HMO) | Admitting: Family Medicine

## 2015-02-14 VITALS — BP 103/61 | HR 66 | Temp 98.1°F | Resp 16 | Ht 67.0 in | Wt 147.4 lb

## 2015-02-14 DIAGNOSIS — K529 Noninfective gastroenteritis and colitis, unspecified: Secondary | ICD-10-CM

## 2015-02-14 DIAGNOSIS — F418 Other specified anxiety disorders: Secondary | ICD-10-CM

## 2015-02-14 DIAGNOSIS — Z3200 Encounter for pregnancy test, result unknown: Secondary | ICD-10-CM

## 2015-02-14 DIAGNOSIS — N910 Primary amenorrhea: Secondary | ICD-10-CM

## 2015-02-14 DIAGNOSIS — F329 Major depressive disorder, single episode, unspecified: Secondary | ICD-10-CM

## 2015-02-14 DIAGNOSIS — Z3490 Encounter for supervision of normal pregnancy, unspecified, unspecified trimester: Secondary | ICD-10-CM

## 2015-02-14 DIAGNOSIS — N926 Irregular menstruation, unspecified: Secondary | ICD-10-CM

## 2015-02-14 DIAGNOSIS — F419 Anxiety disorder, unspecified: Principal | ICD-10-CM

## 2015-02-14 LAB — POCT URINE PREGNANCY: Preg Test, Ur: POSITIVE — AB

## 2015-02-14 MED ORDER — SERTRALINE HCL 100 MG PO TABS: 100.0000 mg | ORAL_TABLET | Freq: Every day | ORAL | Status: DC

## 2015-02-14 NOTE — Progress Notes (Signed)
Subjective:    Patient ID: Mallory Craig, female    DOB: Jun 29, 1991, 24 y.o.   MRN: [id number]  02/14/2015  Depression; Anxiety; and Possible Pregnancy   HPI This 24 y.o. female presents for four month follow-up:  1. Depression and anxiety: Patient reports good compliance with medication, good tolerance to medication, and good symptom control.  Taking Zoloft 100mg  daily.  Feels like handling stress well but sometimes stress gets the best of pt.  Does not feel any different on 100mg  daily.  Increased dose did not provide further relief in anxiety.  Mind races sometimes; every other day.  Durenda Age out.  Financial stress; two small children.No panic attacks on Zoloft; was previously having panic attacks 6 months ago.  Has been taking Zoloft with first daughter; no regular crying episodes.  Oldest daughter is 64; youngest daughter 58 months.  Two daughters are 18 months apart.No SI/HI.   Needs an outlet.  Previous therapy for 1.5 years; really beneficial.  Great benefit with therapy.  Considering therapy.   Has good friend support; friends have children.  Gets out of the house daily which really helps stress.  Sleeping well.  When can get full sleep, does really well emotionally. Getting good sleep four nights per week.  Daughters sleep through the night most nights.  Youngest is teething so getting up some.    2. Ileitis: s/p ED evaluation 11/30/2014; s/p CT abdomen/pelvis revealed inflammation and swelling of ileum; cannot rule out Crohn's disease.  S/p GI consultation/[facility].  Etiology unclear.  Inflammation ileum. No evidence of appendicitis.  Gave Miralax that made very very sick. Having excessive b.m. Got really sick on Miralax.  Did not follow-up with [facility].   Intermittent abdominal pain; suffers with constipation mild at times. Depends on diet and stress level. If symptoms continued, recommended colonoscopy.  Has bowel movement 3-4 days per week.  Can suffer with intermittent constipation.  By  third day, becomes uncomfortable.   3. Possible pregnancy: no contraception; husband interested in another child.  Has two small infants.  Breastfeeding youngest child.  LMP 01-01-15 early and normal duration, flow, cramping.  Still breastfeeding youngest.  Pregnancy test at home two days ago positive.  Breast feeding 3-4 times per day.     Review of Systems  Constitutional: Negative for fever, chills, diaphoresis and fatigue.  Eyes: Negative for visual disturbance.  Respiratory: Negative for cough and shortness of breath.   Cardiovascular: Negative for chest pain, palpitations and leg swelling.  Gastrointestinal: Positive for abdominal pain and constipation. Negative for nausea, vomiting, diarrhea, blood in stool, abdominal distention and anal bleeding.  Endocrine: Negative for cold intolerance, heat intolerance, polydipsia, polyphagia and polyuria.  Genitourinary: Positive for menstrual problem.  Neurological: Negative for dizziness, tremors, seizures, syncope, facial asymmetry, speech difficulty, weakness, light-headedness, numbness and headaches.  Psychiatric/Behavioral: Positive for dysphoric mood. Negative for suicidal ideas, sleep disturbance and self-injury. The patient is nervous/anxious.     Past Medical History  Diagnosis Date  . Anxiety   . Allergy     Claritin  . 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 Asthma     Childhood; last Albuterol use age 88   Past Surgical History  Procedure Laterality Date  . Adenoidectomy     Allergies  Allergen Reactions  . Nitrofurantoin     Other reaction(s): Other (See Comments)   Current Outpatient Prescriptions  Medication Sig Dispense Refill  . loratadine (CLARITIN) 10 MG tablet Take 10 mg by  mouth daily as needed for allergies (allergies).     . sertraline (ZOLOFT) 100 MG tablet Take 1 tablet (100 mg total) by mouth daily. 90 tablet 1   No current facility-administered medications for this visit.   Social  History   Social History  . Marital Status: Single    Spouse Name: N/A  . Number of Children: N/A  . Years of Education: N/A   Occupational History  . Not on file.   Social History Main Topics  . Smoking status: Never Smoker   . Smokeless tobacco: Not on file  . Alcohol Use: No  . Drug Use: No  . Sexual Activity: Yes   Other Topics Concern  . Not on file   Social History Narrative   Marital status: married x 4 years.  Happily married; no abuse      Children:  2 children (2, 1)      Lives: with husband, 2 children.      Employment:  Homemaker      Tobacco: none      Alcohol: 2 drinks per week      Drugs:  None      Exercise:  No formal exercise.     Family History  Problem Relation Age of Onset  . Cancer Mother     skin cancer  . Arthritis Mother   . Hyperthyroidism Mother   . Cancer Maternal Grandmother   . Cancer Maternal Grandfather   . Asthma Brother        Objective:    BP 103/61 mmHg  Pulse 66  Temp(Src) 98.1 F (36.7 C) (Oral)  Resp 16  Ht  (1.702 m)  Wt 147 lb 6.4 oz (66.86 kg)  BMI 23.08 kg/m2  SpO2 98%  LMP 01/01/2015 Physical Exam  Constitutional: She is oriented to person, place, and time. She appears well-developed and well-nourished. No distress.  HENT:  Head: Normocephalic and atraumatic.  Right Ear: External ear normal.  Left Ear: External ear normal.  Nose: Nose normal.  Mouth/Throat: Oropharynx is clear and moist.  Eyes: Conjunctivae and EOM are normal. Pupils are equal, round, and reactive to light.  Neck: Normal range of motion. Neck supple. Carotid bruit is not present. No thyromegaly present.  Cardiovascular: Normal rate, regular rhythm, normal heart sounds and intact distal pulses.  Exam reveals no gallop and no friction rub.   No murmur heard. Pulmonary/Chest: Effort normal and breath sounds normal. She has no wheezes. She has no rales.  Abdominal: Soft. Bowel sounds are normal. She exhibits no distension and no mass.  There is no tenderness. There is no rebound and no guarding.  Lymphadenopathy:    She has no cervical adenopathy.  Neurological: She is alert and oriented to person, place, and time. No cranial nerve deficit.  Skin: Skin is warm and dry. No rash noted. She is not diaphoretic. No erythema. No pallor.  Psychiatric: She has a normal mood and affect. Her behavior is normal.   Results for orders placed or performed in visit on 02/14/15  POCT urine pregnancy  Result Value Ref Range   Preg Test, Ur Positive (A) Negative       Assessment & Plan:   1. Anxiety and depression   2. Ileitis   3. Late menses   4. Pregnancy     1. Anxiety and depression: controlled moderately yet ongoing stressors; coping well; will consider restarting therapy.  Refill of Zoloft  daily provided.  RTC 4 months. 2.  Ileitis:  improved; records reviewed from recent ED visit with CT scan abdomen/pelvis.  S/p [facility] consultation by [facility].  May warrant colonoscopy in future. 3.  Late menses/pregnancy: New. Recommend finding PNV that pt can tolerate; LMP 01-01-15 and normal.  Feeling well. Continuing to breastfeed 3-4 times per day.   Meds ordered this encounter  Medications  . sertraline (ZOLOFT) 100 MG tablet    Sig: Take 1 tablet (100 mg total) by mouth daily.    Dispense:  90 tablet    Refill:  1    Return in about 4 months (around 06/16/2015) for recheck anxiety and depression.     Rollie Hynek Paulita Fujita, M.D. [facility] [address] [place], [place]  [address] [phone] phone [phone] fax

## 2015-03-16 ENCOUNTER — Encounter: Payer: Managed Care, Other (non HMO) | Admitting: Certified Nurse Midwife

## 2015-03-23 ENCOUNTER — Ambulatory Visit (INDEPENDENT_AMBULATORY_CARE_PROVIDER_SITE_OTHER): Payer: Managed Care, Other (non HMO) | Admitting: Certified Nurse Midwife

## 2015-03-23 ENCOUNTER — Encounter: Payer: Self-pay | Admitting: Certified Nurse Midwife

## 2015-03-23 VITALS — BP 105/64 | HR 71 | Temp 98.1°F | Wt 146.0 lb

## 2015-03-23 DIAGNOSIS — Z3481 Encounter for supervision of other normal pregnancy, first trimester: Secondary | ICD-10-CM

## 2015-03-23 DIAGNOSIS — Z3480 Encounter for supervision of other normal pregnancy, unspecified trimester: Secondary | ICD-10-CM | POA: Insufficient documentation

## 2015-03-23 DIAGNOSIS — O219 Vomiting of pregnancy, unspecified: Secondary | ICD-10-CM

## 2015-03-23 LAB — POCT URINALYSIS DIPSTICK
Bilirubin, UA: NEGATIVE
Blood, UA: NEGATIVE
Glucose, UA: NEGATIVE
Ketones, UA: NEGATIVE
LEUKOCYTES UA: NEGATIVE
NITRITE UA: NEGATIVE
Protein, UA: NEGATIVE
Spec Grav, UA: 1.015
Urobilinogen, UA: NEGATIVE
pH, UA: 7

## 2015-03-23 MED ORDER — PROMETHAZINE HCL 25 MG PO TABS: 25.0000 mg | ORAL_TABLET | Freq: Four times a day (QID) | ORAL | Status: DC | PRN

## 2015-03-23 NOTE — Progress Notes (Signed)
Subjective:    Mallory Craig is being seen today for her first obstetrical visit.  This is a planned pregnancy. She is at [redacted]w[redacted]d gestation. Her obstetrical history is significant for none. Relationship with FOB: spouse, living together, [place]. Patient does intend to breast feed, breastfeeding currently. Pregnancy history fully reviewed.  The information documented in the HPI was reviewed and verified.  Menstrual History: OB History    Gravida Para Term Preterm AB TAB SAB Ectopic Multiple Living   Proven to 7#7oz, 2 vaginal delieries.   Menarche age: 64 years roughly.    Patient's last menstrual period was 01/01/2015. Sure of LMP, tracking on phone.     Past Medical History  Diagnosis Date  . Anxiety   . Allergy     Claritin  . 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 Asthma     Childhood; last Albuterol use age 11   zoloft after 1st child, currently on Zoloft.    Past Surgical History  Procedure Laterality Date  . Adenoidectomy       (Not in a hospital admission) Allergies  Allergen Reactions  . Nitrofurantoin     Other reaction(s): Other (See Comments)    Social History  Substance Use Topics  . Smoking status: Never Smoker   . Smokeless tobacco: Not on file  . Alcohol Use: No    Family History  Problem Relation Age of Onset  . Cancer Mother     skin cancer  . Arthritis Mother   . Hyperthyroidism Mother   . Cancer Maternal Grandmother   . Cancer Maternal Grandfather   . Asthma Brother      Review of Systems Constitutional: negative for weight loss Gastrointestinal: + for nausea and vomiting Genitourinary:negative for genital lesions and vaginal discharge and dysuria Musculoskeletal:negative for back pain Behavioral/Psych: negative for abusive relationship, depression, illegal drug usage and tobacco use    Objective:    BP 105/64 mmHg  Pulse 71  Temp(Src) 98.1 F (36.7 C)  Wt 146 lb (66.225 kg)  LMP  01/01/2015 General Appearance:    Alert, cooperative, no distress, appears stated age  Head:    Normocephalic, without obvious abnormality, atraumatic  Eyes:    PERRL, conjunctiva/corneas clear, EOM's intact, fundi    benign, both eyes  Ears:    Normal TM's and external ear canals, both ears  Nose:   Nares normal, septum midline, mucosa normal, no drainage    or sinus tenderness  Throat:   Lips, mucosa, and tongue normal; teeth and gums normal  Neck:   Supple, symmetrical, trachea midline, no adenopathy;    thyroid:  no enlargement/tenderness/nodules; no carotid   bruit or JVD  Back:     Symmetric, no curvature, ROM normal, no CVA tenderness  Lungs:     Clear to auscultation bilaterally, respirations unlabored  Chest Wall:    No tenderness or deformity   Heart:    Regular rate and rhythm, S1 and S2 normal, no murmur, rub   or gallop  Breast Exam:    No tenderness, masses, or nipple abnormality  Abdomen:     Soft, non-tender, bowel sounds active all four quadrants,    no masses, no organomegaly  Genitalia:    Normal female without lesion, discharge or tenderness  Extremities:   Extremities normal, atraumatic, no cyanosis or edema  Pulses:   2+ and symmetric all extremities  Skin:  Skin color, texture, turgor normal, no rashes or lesions  Lymph nodes:   Cervical, supraclavicular, and axillary nodes normal  Neurologic:   CNII-XII intact, normal strength, sensation and reflexes    throughout     Cervix:  Long, thick, closed and posterior.     Lab Review Urine pregnancy test Labs reviewed yes Radiologic studies reviewed no Assessment:    Pregnancy at [redacted]w[redacted]d weeks   N&V in early pregnancy  Plan:     Prenatal vitamins.  Counseling provided regarding continued use of seat belts, cessation of alcohol consumption, smoking or use of illicit drugs; infection precautions i.e., influenza/TDAP immunizations, toxoplasmosis,CMV, parvovirus, listeria and varicella; workplace safety, exercise  during pregnancy; routine dental care, safe medications, sexual activity, hot tubs, saunas, pools, travel, caffeine use, fish and methlymercury, potential toxins, hair treatments, varicose veins Weight gain recommendations per IOM guidelines reviewed: underweight/BMI< 18.5--> gain 28 - 40 lbs; normal weight/BMI 18.5 - 24.9--> gain 25 - 35 lbs; overweight/BMI 25 - 29.9--> gain 15 - 25 lbs; obese/BMI >30->gain  11 - 20 lbs Problem list reviewed and updated. FIRST/CF mutation testing/NIPT/QUAD SCREEN/fragile X/Ashkenazi Jewish population testing/Spinal muscular atrophy discussed: requested. Role of ultrasound in pregnancy discussed; fetal survey: requested. Amniocentesis discussed: not indicated. VBAC calculator score: VBAC consent form provided Meds ordered this encounter  Medications  . Prenatal Multivit-Min-Fe-FA (PRENATAL VITAMINS PO)    Sig: Take by mouth.   Orders Placed This Encounter  Procedures  . Culture, OB Urine  . SureSwab, Vaginosis/Vaginitis Plus  . Obstetric panel  . HIV antibody  . Hemoglobinopathy evaluation  . Varicella zoster antibody, IgG  . Vit D  25 hydroxy (rtn osteoporosis monitoring)  . POCT urinalysis dipstick    Follow up in 4 weeks. 50% of 30 min visit spent on counseling and coordination of care.

## 2015-03-24 LAB — OBSTETRIC PANEL
Antibody Screen: NEGATIVE
Basophils Absolute: 0.1 10*3/uL (ref 0.0–0.1)
Basophils Relative: 1 % (ref 0–1)
Eosinophils Absolute: 0.1 10*3/uL (ref 0.0–0.7)
Eosinophils Relative: 1 % (ref 0–5)
HEMATOCRIT: 39.3 % (ref 36.0–46.0)
HEP B S AG: NEGATIVE
Hemoglobin: 13.2 g/dL (ref 12.0–15.0)
LYMPHS ABS: 1.1 10*3/uL (ref 0.7–4.0)
LYMPHS PCT: 13 % (ref 12–46)
MCH: 28.3 pg (ref 26.0–34.0)
MCHC: 33.6 g/dL (ref 30.0–36.0)
MCV: 84.3 fL (ref 78.0–100.0)
MONO ABS: 0.5 10*3/uL (ref 0.1–1.0)
MONOS PCT: 6 % (ref 3–12)
MPV: 10.5 fL (ref 8.6–12.4)
NEUTROS ABS: 7 10*3/uL (ref 1.7–7.7)
NEUTROS PCT: 79 % — AB (ref 43–77)
PLATELETS: 200 10*3/uL (ref 150–400)
RBC: 4.66 MIL/uL (ref 3.87–5.11)
RDW: 14.5 % (ref 11.5–15.5)
RH TYPE: POSITIVE
Rubella: 1.93 Index — ABNORMAL HIGH (ref <0.90)
WBC: 8.8 10*3/uL (ref 4.0–10.5)

## 2015-03-24 LAB — PAP IG W/ RFLX HPV ASCU

## 2015-03-24 LAB — VITAMIN D 25 HYDROXY (VIT D DEFICIENCY, FRACTURES): Vit D, 25-Hydroxy: 41 ng/mL (ref 30–100)

## 2015-03-24 LAB — CULTURE, OB URINE: Colony Count: 30000

## 2015-03-24 LAB — HIV ANTIBODY (ROUTINE TESTING W REFLEX): HIV: NONREACTIVE

## 2015-03-24 LAB — VARICELLA ZOSTER ANTIBODY, IGG: Varicella IgG: 110.7 Index (ref <135.00)

## 2015-03-25 ENCOUNTER — Other Ambulatory Visit: Payer: Self-pay | Admitting: Certified Nurse Midwife

## 2015-03-25 DIAGNOSIS — O2341 Unspecified infection of urinary tract in pregnancy, first trimester: Secondary | ICD-10-CM

## 2015-03-25 LAB — HEMOGLOBINOPATHY EVALUATION
HEMOGLOBIN OTHER: 0 %
HGB A: 97.3 % (ref 96.8–97.8)
Hgb A2 Quant: 2.7 % (ref 2.2–3.2)
Hgb F Quant: 0 % (ref 0.0–2.0)
Hgb S Quant: 0 %

## 2015-03-25 MED ORDER — AMOXICILLIN-POT CLAVULANATE 875-125 MG PO TABS: 1.0000 | ORAL_TABLET | Freq: Two times a day (BID) | ORAL | Status: AC

## 2015-03-27 LAB — SURESWAB, VAGINOSIS/VAGINITIS PLUS
ATOPOBIUM VAGINAE: NOT DETECTED Log (cells/mL)
C. GLABRATA, DNA: NOT DETECTED
C. albicans, DNA: NOT DETECTED
C. parapsilosis, DNA: NOT DETECTED
C. trachomatis RNA, TMA: NOT DETECTED
C. tropicalis, DNA: NOT DETECTED
GARDNERELLA VAGINALIS: NOT DETECTED Log (cells/mL)
LACTOBACILLUS SPECIES: NOT DETECTED Log (cells/mL)
MEGASPHAERA SPECIES: NOT DETECTED Log (cells/mL)
N. gonorrhoeae RNA, TMA: NOT DETECTED
T. vaginalis RNA, QL TMA: NOT DETECTED

## 2015-04-04 ENCOUNTER — Telehealth: Payer: Self-pay | Admitting: *Deleted

## 2015-04-04 NOTE — Telephone Encounter (Signed)
Patient reports she had some slight bleeding,but now she is itching and wants to know if she can treat yeast. 11:10 LM on VM- Reasons for bleeding in early pregnancy- and directions for what to do if it becomes heavier. Also ok to use over the counter yeast treatment . Call office for an earlier appointment if feels she needs it.

## 2015-04-08 ENCOUNTER — Encounter: Payer: Self-pay | Admitting: *Deleted

## 2015-04-20 ENCOUNTER — Ambulatory Visit (INDEPENDENT_AMBULATORY_CARE_PROVIDER_SITE_OTHER): Payer: Managed Care, Other (non HMO) | Admitting: Certified Nurse Midwife

## 2015-04-20 VITALS — BP 108/60 | HR 72 | Wt 148.0 lb

## 2015-04-20 DIAGNOSIS — O219 Vomiting of pregnancy, unspecified: Secondary | ICD-10-CM

## 2015-04-20 DIAGNOSIS — Z3482 Encounter for supervision of other normal pregnancy, second trimester: Secondary | ICD-10-CM

## 2015-04-20 LAB — POCT URINALYSIS DIPSTICK
BILIRUBIN UA: NEGATIVE
Blood, UA: NEGATIVE
GLUCOSE UA: NEGATIVE
Ketones, UA: NEGATIVE
LEUKOCYTES UA: NEGATIVE
NITRITE UA: NEGATIVE
Protein, UA: NEGATIVE
Spec Grav, UA: 1.02
Urobilinogen, UA: NEGATIVE
pH, UA: 6

## 2015-04-20 MED ORDER — ONDANSETRON HCL 8 MG PO TABS
8.0000 mg | ORAL_TABLET | Freq: Three times a day (TID) | ORAL | Status: DC | PRN
Start: 2015-04-20 — End: 2015-06-16

## 2015-04-20 NOTE — Progress Notes (Signed)
  Subjective:    Mallory Craig is a 24 y.o. female being seen today for her obstetrical visit. She is at 2682w4d gestation. Patient reports: backache, no bleeding, no contractions, no cramping, no leaking and UTI symptoms of frequency and low back pain.  Denies burning with urination. Does have nausea, denies any vomiting.  Discussed positive lifting techniques.  Encouraged patient to call if her UTI symptoms did not improve or got worse.  Patient states she does not have time to have her quad screen drawn today.  Both of her children were fussy.    Problem List Items Addressed This Visit    None    Visit Diagnoses    Encounter for supervision of other normal pregnancy in second trimester    -  Primary    Relevant Medications    ondansetron (ZOFRAN) 8 MG tablet    Other Relevant Orders    POCT urinalysis dipstick (Completed)    AFP, Quad Screen    US OB Comp + 14 Wk    Nausea and vomiting in pregnancy prior to [redacted] weeks gestation        Relevant Medications    ondansetron (ZOFRAN) 8 MG tablet      Patient Active Problem List   Diagnosis Date Noted  . Encounter for supervision of other normal pregnancy in first trimester 03/23/2015  . Anxiety and depression 02/14/2015    Objective:     BP 108/60 mmHg  Pulse 72  Wt 148 lb (67.132 kg)  LMP 01/01/2015 Uterine Size: Below umbilicus   FHR by doppler pressent about 160 bpm  Assessment:    Pregnancy @ 882w4d  weeks Doing well   UTI symptoms Nausea of early pregnancy  Plan:    Problem list reviewed and updated. Labs reviewed.  Follow up in 4 weeks. FIRST/CF mutation testing/NIPT/QUAD SCREEN/fragile X/Ashkenazi Jewish population testing/Spinal muscular atrophy discussed: requested. Role of ultrasound in pregnancy discussed; fetal survey: ordered. Amniocentesis discussed: not indicated. 50% of 15 minute visit spent on counseling and coordination of care.

## 2015-04-22 ENCOUNTER — Ambulatory Visit (INDEPENDENT_AMBULATORY_CARE_PROVIDER_SITE_OTHER): Payer: Managed Care, Other (non HMO)

## 2015-04-22 DIAGNOSIS — Z23 Encounter for immunization: Secondary | ICD-10-CM | POA: Diagnosis not present

## 2015-05-11 ENCOUNTER — Other Ambulatory Visit: Payer: Self-pay | Admitting: Certified Nurse Midwife

## 2015-05-19 ENCOUNTER — Ambulatory Visit (INDEPENDENT_AMBULATORY_CARE_PROVIDER_SITE_OTHER): Payer: Managed Care, Other (non HMO) | Admitting: Certified Nurse Midwife

## 2015-05-19 ENCOUNTER — Ambulatory Visit (INDEPENDENT_AMBULATORY_CARE_PROVIDER_SITE_OTHER): Payer: Managed Care, Other (non HMO)

## 2015-05-19 VITALS — BP 104/65 | HR 66 | Temp 97.5°F | Wt 155.0 lb

## 2015-05-19 DIAGNOSIS — Z36 Encounter for antenatal screening of mother: Secondary | ICD-10-CM | POA: Diagnosis not present

## 2015-05-19 DIAGNOSIS — Z3482 Encounter for supervision of other normal pregnancy, second trimester: Secondary | ICD-10-CM

## 2015-05-19 DIAGNOSIS — O219 Vomiting of pregnancy, unspecified: Secondary | ICD-10-CM

## 2015-05-19 DIAGNOSIS — Z3481 Encounter for supervision of other normal pregnancy, first trimester: Secondary | ICD-10-CM | POA: Diagnosis not present

## 2015-05-19 LAB — POCT URINALYSIS DIPSTICK
Bilirubin, UA: NEGATIVE
Blood, UA: NEGATIVE
Glucose, UA: NEGATIVE
Ketones, UA: NEGATIVE
Nitrite, UA: NEGATIVE
Protein, UA: NEGATIVE
Spec Grav, UA: 1.015
Urobilinogen, UA: NEGATIVE
pH, UA: 7

## 2015-05-19 MED ORDER — DOXYLAMINE-PYRIDOXINE 10-10 MG PO TBEC: DELAYED_RELEASE_TABLET | ORAL | Status: DC

## 2015-05-19 MED ORDER — METOCLOPRAMIDE HCL 10 MG PO TABS: 10.0000 mg | ORAL_TABLET | Freq: Four times a day (QID) | ORAL | Status: DC

## 2015-05-19 NOTE — Progress Notes (Signed)
Subjective:    Fletcher AnonJacqueline Jefcoat is a 24 y.o. female being seen today for her obstetrical visit. She is at [redacted]w[redacted]d gestation. Patient reports: no complaints . Fetal movement: normal.  Problem List Items Addressed This Visit    None    Visit Diagnoses    Supervision of other normal pregnancy, antepartum, second trimester    -  Primary    Relevant Orders    POCT urinalysis dipstick (Completed)    Nausea and vomiting in pregnancy prior to [redacted] weeks gestation        Relevant Medications    Doxylamine-Pyridoxine (DICLEGIS) 10-10 MG TBEC    metoCLOPramide (REGLAN) 10 MG tablet      Patient Active Problem List   Diagnosis Date Noted  . Encounter for supervision of other normal pregnancy in first trimester 03/23/2015  . Anxiety and depression 02/14/2015   Objective:    BP 104/65 mmHg  Pulse 66  Temp(Src) 97.5 F (36.4 C)  Wt 155 lb (70.308 kg)  LMP 01/01/2015 FHT: 150 BPM  Uterine Size: size equals dates     Assessment:    Pregnancy @ 562w5d    Doing well Plan:    OBGCT: discussed. Signs and symptoms of preterm labor: discussed.  Labs, problem list reviewed and updated 2 hr GTT planned Follow up in 4 weeks.

## 2015-05-20 LAB — AFP, QUAD SCREEN
AFP: 87.7 ng/mL
Curr Gest Age: 19.5 wks.days
HCG TOTAL: 28.76 [IU]/mL
INH: 116.6 pg/mL
INTERPRETATION-AFP: NEGATIVE
MOM FOR AFP: 1.56
MOM FOR HCG: 1.24
MoM for INH: 0.63
OPEN SPINA BIFIDA: NEGATIVE
Osb Risk: 1:2360 {titer}
Tri 18 Scr Risk Est: NEGATIVE
Trisomy 18 (Edward) Syndrome Interp.: 1:98600 {titer}
UE3 MOM: 0.84
UE3 VALUE: 1.43 ng/mL

## 2015-05-30 ENCOUNTER — Other Ambulatory Visit: Payer: Self-pay | Admitting: *Deleted

## 2015-05-30 ENCOUNTER — Other Ambulatory Visit: Payer: Self-pay | Admitting: Certified Nurse Midwife

## 2015-05-30 DIAGNOSIS — G930 Cerebral cysts: Secondary | ICD-10-CM

## 2015-06-01 ENCOUNTER — Other Ambulatory Visit: Payer: Self-pay | Admitting: Certified Nurse Midwife

## 2015-06-01 MED ORDER — CITRANATAL HARMONY 30-1-260 MG PO CAPS: 1.0000 | ORAL_CAPSULE | Freq: Every day | ORAL | Status: DC

## 2015-06-03 ENCOUNTER — Other Ambulatory Visit: Payer: Self-pay | Admitting: Certified Nurse Midwife

## 2015-06-09 ENCOUNTER — Other Ambulatory Visit: Payer: Self-pay | Admitting: Obstetrics

## 2015-06-09 ENCOUNTER — Encounter (HOSPITAL_COMMUNITY): Payer: Self-pay

## 2015-06-09 ENCOUNTER — Ambulatory Visit (HOSPITAL_COMMUNITY)
Admission: RE | Admit: 2015-06-09 | Discharge: 2015-06-09 | Disposition: A | Discharge status: Home / Self Care | Payer: Managed Care, Other (non HMO) | Source: Ambulatory Visit | Attending: Obstetrics | Admitting: Obstetrics

## 2015-06-09 DIAGNOSIS — O358XX0 Maternal care for other (suspected) fetal abnormality and damage, not applicable or unspecified: Secondary | ICD-10-CM

## 2015-06-09 DIAGNOSIS — Z315 Encounter for genetic counseling: Secondary | ICD-10-CM | POA: Insufficient documentation

## 2015-06-09 DIAGNOSIS — O283 Abnormal ultrasonic finding on antenatal screening of mother: Secondary | ICD-10-CM | POA: Diagnosis not present

## 2015-06-09 DIAGNOSIS — Z8489 Family history of other specified conditions: Secondary | ICD-10-CM | POA: Insufficient documentation

## 2015-06-09 DIAGNOSIS — Z3A22 22 weeks gestation of pregnancy: Secondary | ICD-10-CM

## 2015-06-09 DIAGNOSIS — Z36 Encounter for antenatal screening of mother: Secondary | ICD-10-CM | POA: Insufficient documentation

## 2015-06-09 DIAGNOSIS — Z1389 Encounter for screening for other disorder: Secondary | ICD-10-CM

## 2015-06-09 DIAGNOSIS — O35EXX0 Maternal care for other (suspected) fetal abnormality and damage, fetal genitourinary anomalies, not applicable or unspecified: Secondary | ICD-10-CM

## 2015-06-09 DIAGNOSIS — O350XX0 Maternal care for (suspected) central nervous system malformation in fetus, not applicable or unspecified: Secondary | ICD-10-CM | POA: Insufficient documentation

## 2015-06-09 DIAGNOSIS — IMO0001 Reserved for inherently not codable concepts without codable children: Secondary | ICD-10-CM

## 2015-06-09 DIAGNOSIS — G930 Cerebral cysts: Secondary | ICD-10-CM

## 2015-06-09 NOTE — Progress Notes (Signed)
Genetic Counseling  High-Risk Gestation Note  Appointment Date:  06/09/2015 Referred By: Ethelda ChickSmith, Kristi M, MD Date of Birth:  [birth year] Partner:  Blanchie Serveonald Montesdeoca   Pregnancy History: [id number]: G3P2002 Estimated Date of Delivery: 10/08/15 Estimated Gestational Age: 3968w5d Attending: Alpha GulaPaul Whitecar, MD  Ms. Fletcher AnonJacqueline Belizaire and her husband, Mr. Blanchie ServeDonald Blandon, were seen for genetic counseling because of abnormal ultrasound findings.    In Summary:   Previous ultrasound through OB office visualized choroid plexus cysts (CPC) and fetal pyelectasis  Ultrasound today visualized right fetal pyelectasis (5mm)  Quad screen previously performed and within normal range; 1 in 31,500 Down syndrome risk and 1 in 98,600 T18 risk  Discussed that given Quad screen result, ultrasound markers would not significantly increased risk for T21 or T18 in the current pregnancy  Couple offered and declined NIPS and amniocentesis  Family history significant for cystic fibrosis for Mr. Steward RosRiley's niece; Couple stated that they both had negative CF screening in the patient's first pregnancy  Ms. Victory DakinRiley previously had ultrasound through her OB office which visualized choroid plexus cysts and pyelectasis.  Ultrasound performed today visualized right fetal pyelectasis (5 mm). Previously noted CPCs appeared to have resolved. Complete ultrasound results reported separately.     We discussed that the second trimester genetic sonogram is targeted at identifying features associated with aneuploidy.  It has evolved as a screening tool used to provide an individualized risk assessment for Down syndrome and other trisomies.  The ability of sonography to aid in the detection of aneuploidies relies on identification of both major structural anomalies and "soft markers."  The patient was counseled that the latter term refers to findings that are often normal variants and do not cause any significant medical problems.  Nonetheless, these markers have a  known association with aneuploidy.  We discussed that fetal pyelectasis is defined as the dilatation of the fetal renal pelvis/pelvises due to excess urine. This finding is estimated to occur in 2-3% of fetuses.  The female to female ratio is 2:1.  Typically, babies with mild pyelectasis are born normal and healthy and we are usually unable to determine why this extra fluid is present.  This urine accumulation may regress, stay the same or continue to accumulate.  The more fluid that accumulates, the more likely this fluid could be the result of a compromise in kidney function, an obstruction, or narrowing of the ureters which transport urine out of the body, thus causing backflow of fluid into the kidneys.  Therefore, it is important to follow pyelectasis to make sure it does not become more concerning.  Also, in some cases postnatal evaluation of baby's kidneys may be warranted.  We discussed that the finding of pyelectasis is associated with an increased risk for fetal aneuploidy.  This risk is highest when other anomalies or fetal differences are visualized.    They were counseled that the choroid plexus is an area in the brain where cerebral spinal fluid, the fluid that bathes the brain and spinal cord, is made.  Cysts, or fluid filled sacs, are sometimes found in the choroid plexus of babies both before and after they are born.  We discussed that approximately 1% of pregnancies evaluated by ultrasound will show choroid plexus cyst (CPCs).  Literature suggests that CPCs are an ultrasound finding in approximately 30-50% of fetuses with trisomy 18, but are an isolated finding in less than 10% of fetuses with trisomy 518.  Ms. Juliann ParesX was counseled that when a patient has other risk factors for  fetal trisomy 71 (abnormal First trimester or quad screening, advanced maternal age, or another ultrasound finding), CPCs are associated with an increased risk (LR of 9) for trisomy 21.  Newer literature suggests that in the  absence of other risk factors, CPCs are likely a normal variation of development or a benign finding.  CPCs are not associated with an increased risk for fetal Down syndrome.  We reviewed chromosomes, nondisjunction, and the common features of Down syndrome and trisomy 28.  We also reviewed Ms. Oppenheimer's normal Quad screening result and the associated 1 in 31,500 risk for fetal Down syndrome and 1 in 98,600 risk for fetal trisomy 18.  Considering her maternal age of 24 y.o., her otherwise normal fetal anatomy ultrasound, and her normal Quad screening result, Ms. Stovall's risk for fetal trisomy 34 or trisomy 16 is not expected to be increased above her screen adjusted risk.  However, additional testing options for detection of fetal trisomy 23 and trisomy 36 were discussed.    We reviewed other available screening and diagnostic options including noninvasive prenatal screening (NIPS)/cell free fetal DNA (cffDNA) testing, and amniocentesis.  She was counseled regarding the benefits and limitations of each option.  We reviewed the approximate 1 in 300-500 risk for complications for amniocentesis, including spontaneous pregnancy loss. After consideration of all the options, she declined NIPS and amniocentesis today.   Both family histories were reviewed and found to be contributory for cystic fibrosis in Mr. Kilcrease niece. She is currently 19 years old and was reportedly diagnosed in infancy/childhood. We reviewed genes and the autosomal recessive inheritance of cystic fibrosis. Prior to carrier screening, Mr. Barbone would have an approximate 1 in 2 chance to be a carrier, and MRs. Nicholls would have the general population risk. The couple reported that they both had CF carrier screening through Mrs. Bolanos OB provider in [place], [place] during her first pregnancy, and the screening was normal/negative for both of them. We do not have documentation of these results or the number of mutations screened. It is also  not known if the specific CFTR mutations present in Mr. Jurewicz niece were included on the CF carrier screening panels. We reviewed that CF carrier screening is highly sensitive and specific but does not detect all CF carriers. Given their report, the chance for both of them to be CF carriers has been significantly reduced.  Without further information regarding the provided family history, an accurate genetic risk cannot be calculated. Further genetic counseling is warranted if more information is obtained.   Mrs. Elice Crigger denied exposure to environmental toxins or chemical agents. She denied the use of alcohol, tobacco or street drugs. She denied significant viral illnesses during the course of her pregnancy. Her medical and surgical histories were noncontributory.   I counseled this couple regarding the above risks and available options.  The approximate face-to-face time with the genetic counselor was 40 minutes.    Quinn Plowman, MS Certified Genetic Counselor 06/09/2015

## 2015-06-10 ENCOUNTER — Telehealth: Payer: Self-pay

## 2015-06-10 NOTE — Telephone Encounter (Signed)
Questions regarding 23 week pregnancy and zoloft.  Sick with chest cold.  Not improving.  Can she be treated by Dr. Katrinka BlazingSmith or OB/GYN. She has an appointment next week.   [phone]

## 2015-06-10 NOTE — Telephone Encounter (Signed)
Dr. Smith  Please see previous message 

## 2015-06-10 NOTE — Telephone Encounter (Signed)
Pt called in wanting a CB concerning-she stopped taking sertraline (ZOLOFT) 100 MG tablet [[id number]][[id number]] one week ago. She is concerned about what this may do to her. Please advise at  [phone]

## 2015-06-10 NOTE — Telephone Encounter (Signed)
Call ---- Stopping Zoloft 100mg  daily abruptly can cause dizziness, moodiness.  I usually recommend weaning medication slowly:  Decrease to 1/2 tablet daily for two weeks then decrease to 1/2 tablet every other day for one week and then STOP.

## 2015-06-10 NOTE — Telephone Encounter (Signed)
Left voicemail advising pt she can come here.

## 2015-06-12 NOTE — Telephone Encounter (Signed)
Left message for her to call back

## 2015-06-15 NOTE — Telephone Encounter (Signed)
Phone is disconnected.

## 2015-06-16 ENCOUNTER — Ambulatory Visit (INDEPENDENT_AMBULATORY_CARE_PROVIDER_SITE_OTHER): Payer: Managed Care, Other (non HMO) | Admitting: Certified Nurse Midwife

## 2015-06-16 VITALS — BP 116/68 | HR 85 | Temp 97.5°F | Wt 155.0 lb

## 2015-06-16 DIAGNOSIS — Z3482 Encounter for supervision of other normal pregnancy, second trimester: Secondary | ICD-10-CM

## 2015-06-16 DIAGNOSIS — K219 Gastro-esophageal reflux disease without esophagitis: Secondary | ICD-10-CM

## 2015-06-16 LAB — POCT URINALYSIS DIPSTICK
Bilirubin, UA: NEGATIVE
Blood, UA: NEGATIVE
GLUCOSE UA: NEGATIVE
KETONES UA: NEGATIVE
Nitrite, UA: NEGATIVE
SPEC GRAV UA: 1.02
Urobilinogen, UA: NEGATIVE
pH, UA: 6

## 2015-06-16 MED ORDER — OMEPRAZOLE 20 MG PO CPDR: 20.0000 mg | DELAYED_RELEASE_CAPSULE | Freq: Two times a day (BID) | ORAL | Status: DC

## 2015-06-16 MED ORDER — CALCIUM CARBONATE ANTACID 500 MG PO CHEW
1.0000 | CHEWABLE_TABLET | Freq: Three times a day (TID) | ORAL | Status: DC
Start: 2015-06-16 — End: 2016-10-16

## 2015-06-17 NOTE — Progress Notes (Signed)
Subjective:    Mallory Craig is a 24 y.o. female being seen today for her obstetrical visit. She is at 346w6d gestation. Patient reports: backache, heartburn, no bleeding, no contractions, no cramping, no leaking and leg cramps . Fetal movement: normal.  Problem List Items Addressed This Visit    None    Visit Diagnoses    Supervision of other normal pregnancy, antepartum, second trimester    -  Primary    Relevant Medications    calcium carbonate (TUMS) 500 MG chewable tablet    omeprazole (PRILOSEC) 20 MG capsule    Other Relevant Orders    POCT urinalysis dipstick (Completed)    Gastroesophageal reflux disease without esophagitis        Relevant Medications    calcium carbonate (TUMS) 500 MG chewable tablet    omeprazole (PRILOSEC) 20 MG capsule      Patient Active Problem List   Diagnosis Date Noted  . Ultrasound recheck of fetal pyelectasis, antepartum 06/09/2015  . Family history of genetic disorder 06/09/2015  . Encounter for supervision of other normal pregnancy in first trimester 03/23/2015  . Anxiety and depression 02/14/2015   Objective:    BP 116/68 mmHg  Pulse 85  Temp(Src) 97.5 F (36.4 C)  Wt 155 lb (70.308 kg)  LMP 01/01/2015 FHT: 150 BPM  Uterine Size: size equals dates     Assessment:    Pregnancy @ 706w6d    GERD  Round ligament pain  Lumbar back pain  Normal pregnancy symptoms  Plan:  Rx: maternity support belt   OBGCT: discussed. Signs and symptoms of preterm labor: discussed.  Labs, problem list reviewed and updated 2 hr GTT planned for next ROB Follow up in 4 weeks.

## 2015-06-20 ENCOUNTER — Ambulatory Visit: Payer: Self-pay | Admitting: Family Medicine

## 2015-06-27 ENCOUNTER — Telehealth: Payer: Self-pay

## 2015-06-27 NOTE — Telephone Encounter (Signed)
Left message for pt to call back.   She has to come in for a flu test.

## 2015-06-27 NOTE — Telephone Encounter (Signed)
Pt states she have flu-like symptoms and would like to speak with a nurse. Pt was advised to come in but she would prefer a call back at [phone]     [facility]

## 2015-06-27 NOTE — Telephone Encounter (Signed)
I advised pt to come in for eval. Her phone kept dropping the call but I feel she got the message.

## 2015-07-03 NOTE — L&D Delivery Note (Signed)
Patient asked to dismiss Dr Clearance CootsHarper from her care as she did not consent to cesarean section for fetal demise with malpresentation  Exam revealed fetus in vagina shoulder presenting. Vaginal delivery with compound presentation with no complication,stillborn female infant, placenta delivered immediately, intact. No lacerations noted, EBL 150 ml  Postpartum to faculty practice service.  Adam PhenixJames G Latiffany Harwick, MD 07/05/2015 9:08 PM

## 2015-07-03 NOTE — L&D Delivery Note (Signed)
Delivery Note At 9:39 PM a viable unspecified sex was delivered via Vaginal, Spontaneous Delivery (Presentation: OA).  APGAR: 8, 9; weight pending.   Placenta status: spontaneous, intact.  Cord: 3VC, loose nuchal x 1 reduced on perineum  with out complications.  Cord pH: N/A  Anesthesia: Epidural   Episiotomy: None Lacerations:  none Suture Repair: N/A Est. Blood Loss (mL):  300mL  Mom to postpartum.  Baby to Couplet care / Skin to Skin.  Mallory Craig M 06/22/2016, 9:54 PM

## 2015-07-04 ENCOUNTER — Inpatient Hospital Stay (HOSPITAL_COMMUNITY)
Admission: AD | Admit: 2015-07-04 | Discharge: 2015-07-06 | DRG: 775 | Disposition: A | Discharge status: Home / Self Care | Payer: Managed Care, Other (non HMO) | Source: Ambulatory Visit | Attending: Obstetrics & Gynecology | Admitting: Obstetrics & Gynecology

## 2015-07-04 ENCOUNTER — Encounter (HOSPITAL_COMMUNITY): Payer: Self-pay | Admitting: *Deleted

## 2015-07-04 ENCOUNTER — Inpatient Hospital Stay (HOSPITAL_COMMUNITY): Payer: Managed Care, Other (non HMO)

## 2015-07-04 DIAGNOSIS — O368190 Decreased fetal movements, unspecified trimester, not applicable or unspecified: Secondary | ICD-10-CM | POA: Diagnosis not present

## 2015-07-04 DIAGNOSIS — O321XX0 Maternal care for breech presentation, not applicable or unspecified: Secondary | ICD-10-CM

## 2015-07-04 DIAGNOSIS — F329 Major depressive disorder, single episode, unspecified: Secondary | ICD-10-CM

## 2015-07-04 DIAGNOSIS — O368121 Decreased fetal movements, second trimester, fetus 1: Secondary | ICD-10-CM

## 2015-07-04 DIAGNOSIS — O364XX0 Maternal care for intrauterine death, not applicable or unspecified: Principal | ICD-10-CM | POA: Diagnosis present

## 2015-07-04 DIAGNOSIS — O35EXX0 Maternal care for other (suspected) fetal abnormality and damage, fetal genitourinary anomalies, not applicable or unspecified: Secondary | ICD-10-CM

## 2015-07-04 DIAGNOSIS — O322XX0 Maternal care for transverse and oblique lie, not applicable or unspecified: Secondary | ICD-10-CM | POA: Diagnosis present

## 2015-07-04 DIAGNOSIS — O358XX0 Maternal care for other (suspected) fetal abnormality and damage, not applicable or unspecified: Secondary | ICD-10-CM

## 2015-07-04 DIAGNOSIS — IMO0002 Reserved for concepts with insufficient information to code with codable children: Secondary | ICD-10-CM

## 2015-07-04 DIAGNOSIS — Z8489 Family history of other specified conditions: Secondary | ICD-10-CM

## 2015-07-04 DIAGNOSIS — Z3A26 26 weeks gestation of pregnancy: Secondary | ICD-10-CM

## 2015-07-04 DIAGNOSIS — F419 Anxiety disorder, unspecified: Secondary | ICD-10-CM

## 2015-07-04 LAB — CBC
HEMATOCRIT: 39.5 % (ref 36.0–46.0)
HEMOGLOBIN: 13.6 g/dL (ref 12.0–15.0)
MCH: 30.9 pg (ref 26.0–34.0)
MCHC: 34.4 g/dL (ref 30.0–36.0)
MCV: 89.8 fL (ref 78.0–100.0)
Platelets: 195 10*3/uL (ref 150–400)
RBC: 4.4 MIL/uL (ref 3.87–5.11)
RDW: 13.7 % (ref 11.5–15.5)
WBC: 17.5 10*3/uL — ABNORMAL HIGH (ref 4.0–10.5)

## 2015-07-04 LAB — ABO/RH: ABO/RH(D): B POS

## 2015-07-04 LAB — TYPE AND SCREEN
ABO/RH(D): B POS
Antibody Screen: NEGATIVE

## 2015-07-04 MED ORDER — OXYCODONE-ACETAMINOPHEN 5-325 MG PO TABS
2.0000 | ORAL_TABLET | ORAL | Status: DC | PRN
  Filled 2015-07-04: qty 2

## 2015-07-04 MED ORDER — FENTANYL 2.5 MCG/ML BUPIVACAINE 1/10 % EPIDURAL INFUSION (WH - ANES)
14.0000 mL/h | INTRAMUSCULAR | Status: DC | PRN
  Administered 2015-07-05 (×2): 14 mL/h via EPIDURAL
  Filled 2015-07-04 (×2): qty 125

## 2015-07-04 MED ORDER — MISOPROSTOL 25 MCG QUARTER TABLET
25.0000 ug | ORAL_TABLET | ORAL | Status: DC
  Administered 2015-07-04 – 2015-07-05 (×3): 25 ug via VAGINAL
  Filled 2015-07-04 (×3): qty 0.25

## 2015-07-04 MED ORDER — OXYTOCIN 40 UNITS IN LACTATED RINGERS INFUSION - SIMPLE MED: 62.5000 mL/h | INTRAVENOUS | Status: DC

## 2015-07-04 MED ORDER — ONDANSETRON HCL 4 MG/2ML IJ SOLN
4.0000 mg | Freq: Four times a day (QID) | INTRAMUSCULAR | Status: DC | PRN
  Administered 2015-07-04: 4 mg via INTRAVENOUS
  Filled 2015-07-04: qty 2

## 2015-07-04 MED ORDER — CITRIC ACID-SODIUM CITRATE 334-500 MG/5ML PO SOLN
30.0000 mL | ORAL | Status: DC | PRN
  Filled 2015-07-04: qty 15

## 2015-07-04 MED ORDER — EPHEDRINE 5 MG/ML INJ
10.0000 mg | INTRAVENOUS | Status: DC | PRN
  Filled 2015-07-04: qty 2

## 2015-07-04 MED ORDER — OXYCODONE-ACETAMINOPHEN 5-325 MG PO TABS: 1.0000 | ORAL_TABLET | ORAL | Status: DC | PRN

## 2015-07-04 MED ORDER — LIDOCAINE HCL (PF) 1 % IJ SOLN
30.0000 mL | INTRAMUSCULAR | Status: DC | PRN
  Filled 2015-07-04: qty 30

## 2015-07-04 MED ORDER — ZOLPIDEM TARTRATE 5 MG PO TABS
5.0000 mg | ORAL_TABLET | Freq: Every evening | ORAL | Status: DC | PRN
  Administered 2015-07-05: 5 mg via ORAL
  Filled 2015-07-04: qty 1

## 2015-07-04 MED ORDER — LACTATED RINGERS IV SOLN
INTRAVENOUS | Status: DC
  Administered 2015-07-04 – 2015-07-05 (×3): via INTRAVENOUS

## 2015-07-04 MED ORDER — OXYTOCIN BOLUS FROM INFUSION
500.0000 mL | INTRAVENOUS | Status: DC
  Administered 2015-07-05: 500 mL via INTRAVENOUS

## 2015-07-04 MED ORDER — BUTORPHANOL TARTRATE 1 MG/ML IJ SOLN
1.0000 mg | INTRAMUSCULAR | Status: DC | PRN
  Administered 2015-07-05: 1 mg via INTRAVENOUS
  Filled 2015-07-04: qty 1

## 2015-07-04 MED ORDER — DIPHENHYDRAMINE HCL 50 MG/ML IJ SOLN: 12.5000 mg | INTRAMUSCULAR | Status: DC | PRN

## 2015-07-04 MED ORDER — ACETAMINOPHEN 325 MG PO TABS
650.0000 mg | ORAL_TABLET | ORAL | Status: DC | PRN
  Administered 2015-07-04: 650 mg via ORAL
  Filled 2015-07-04: qty 2

## 2015-07-04 MED ORDER — LACTATED RINGERS IV SOLN
500.0000 mL | INTRAVENOUS | Status: DC | PRN
Start: 2015-07-04 — End: 2015-07-06
  Administered 2015-07-04: 1000 mL via INTRAVENOUS
  Administered 2015-07-05: 500 mL via INTRAVENOUS

## 2015-07-04 MED ORDER — PHENYLEPHRINE 40 MCG/ML (10ML) SYRINGE FOR IV PUSH (FOR BLOOD PRESSURE SUPPORT)
80.0000 ug | PREFILLED_SYRINGE | INTRAVENOUS | Status: DC | PRN
  Filled 2015-07-04: qty 2
  Filled 2015-07-04: qty 20

## 2015-07-04 NOTE — Progress Notes (Signed)
CSW rec'd call from staff in L&D indicating the need for CSW in referenct to a IUFD. CSW met with patient, FOB and [name], Therapist, sports.   CSW offered emotional support to them as they are learning and accepting the news about their son, "Mallory Craig". Mother is a bit tearful- with anticipatory grief and fear related to the actual plans for delivery, etc. CSW and RN talked with them about options related to seeing or not seeing baby once delivered as well as photo opportunities and a memory box that can be offered and provided. This seemed to be a comfort to MOB as she voiced fear of "seeing Mallory Craig" as well as not wanting him placed in the ground- Chaplain provided parents with material on Urns that are child-like and not as the MOB stated, "his ashes in a glass box".  CSW also talked with parents about their financial strains and concerns related to FOB Mallory Craig) being out of work today/tomorrow, as well as being on a tight budget with their mortgage and concerns related to cremation/burial expenses.  CSW plans to seek support for them as able through their church, [employer] in [place], [employer] in [place], [place] [phone]) as well as other community agencies who may be able to [place] will work on the above today and into tomorrow as today is a holiday for [employer] and other community agencies.  CSW was able to speak with [name] at [facility] is familiar with this family and plans to contact FOB to offer a visit and other support.   CSW covering tomorrow to follow up and continue with looking at opportunities for support.   [name], MSW, Latanya Presser [phone]

## 2015-07-04 NOTE — Progress Notes (Signed)
Received referral from RN in MAU notifying chaplain that pt arrived to MAU for reduced fetal movement and found out that her 1726 week old boy [name], had no heart beat.  Chaplain offered spiritual support and helped begin exploring her feelings about the loss of her son.  Pt was consumed with questions about why, what she had done wrong, how she would go on without her son, and what the process would be like.  Chaplain facilitated conversation with nurse and helped pt and her husband contact neighbors and friends to provide for the provision of care for her dog and two daughters Caitlyn (3) and [place] "Christen BameRonnie" (almost 2).  Chaplain also provided additional resources for many of the questions pt had-including how to navigate visitors.  Chaplain normalized the grief process and pt's questions and feelings of shock.  Referral made to oncoming night chaplain for continued spiritual support and pt notified of availability of chaplain at any time.    Pt is having difficulty remaining present in the moment-reflecting on the past and what could have gone wrong, thinking about how she will move forward once this is over, and asking if she can just be "put under" and not have to experience this.  Chaplain normalized the desire to just be done, and encouraged pt to just take each moment one at a time.  Please page as further needs arise.  Maryanna ShapeAmanda M. Carley Hammedavee Lomax, M.Div. [facility] Chaplain Pager [phone] Office [phone]      07/04/15 1632  Clinical Encounter Type  Visited With Patient and family together  Visit Type Initial;Spiritual support;Death  Referral From Nurse  Consult/Referral To Chaplain  Spiritual Encounters  Spiritual Needs Emotional;Grief support  Stress Factors  Patient Stress Factors Loss  Family Stress Factors Loss

## 2015-07-04 NOTE — MAU Provider Note (Signed)
Chief Complaint:  No chief complaint on file.   Provider arrived in room at about 1305 hrs on 07/04/15   HPI  Mallory Craig is a 25 y.o. G3P2002 at 2326w2dwho presents to maternity admissions reporting decreased fetal movement.   I was called to the room to do a bedside Ultrasound to confirm fetal heart tones..  She reports light movement yesterday and no fetal movement today, denies LOF, vaginal bleeding, vaginal itching/burning, urinary symptoms, h/a, dizziness, n/v, diarrhea, constipation or fever/chills.  She denies headache, visual changes or abdominal pain.   Past Medical History: Past Medical History  Diagnosis Date  . Anxiety   . Allergy     Claritin  . 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 Asthma     Childhood; last Albuterol use age 25    Past obstetric history: OB History  Gravida Para Term Preterm AB SAB TAB Ectopic Multiple Living  3 2 2       2     # Outcome Date GA Lbr Len/2nd Weight Sex Delivery Anes PTL Lv  3 Current           2 Term 09/02/13 5567w0d  7 lb 7 oz (3.374 kg) F Vag-Spont None N Y  1 Term 01/23/12 973w0d  6 lb 6 oz (2.892 kg) F Vag-Spont EPI N Y      Past Surgical History: Past Surgical History  Procedure Laterality Date  . Adenoidectomy      Family History: Family History  Problem Relation Age of Onset  . Cancer Mother     skin cancer  . Arthritis Mother   . Hyperthyroidism Mother   . Cancer Maternal Grandmother   . Cancer Maternal Grandfather   . Asthma Brother     Social History: Social History  Substance Use Topics  . Smoking status: Never Smoker   . Smokeless tobacco: None  . Alcohol Use: No    Allergies:  Allergies  Allergen Reactions  . Nitrofurantoin     Other reaction(s): Other (See Comments)    Meds:  Prescriptions prior to admission  Medication Sig Dispense Refill Last Dose  . calcium carbonate (TUMS) 500 MG chewable tablet Chew 1 tablet (200 mg of elemental calcium total) by mouth 3  (three) times daily with meals. 200 tablet 4   . loratadine (CLARITIN) 10 MG tablet Take 10 mg by mouth daily as needed for allergies (allergies). Reported on 06/16/2015   Not Taking  . omeprazole (PRILOSEC) 20 MG capsule Take 1 capsule (20 mg total) by mouth 2 (two) times daily before a meal. 60 capsule 4   . Prenat w/o A-FeCbn-DSS-FA-DHA (CITRANATAL HARMONY) 30-1-260 MG CAPS Take 1 tablet by mouth daily. 30 capsule 12 Taking  . promethazine (PHENERGAN) 25 MG tablet Take 1 tablet (25 mg total) by mouth every 6 (six) hours as needed for nausea or vomiting. (Patient not taking: Reported on 05/19/2015) 30 tablet 1 Not Taking  . sertraline (ZOLOFT) 100 MG tablet Take 1 tablet (100 mg total) by mouth daily. 90 tablet 1 Taking    I have reviewed patient's Past Medical Hx, Surgical Hx, Family Hx, Social Hx, medications and allergies.   ROS:  Review of Systems  Constitutional: Negative for fever, chills and fatigue.  Respiratory: Negative for shortness of breath.   Gastrointestinal: Negative for nausea, vomiting, abdominal pain, diarrhea and constipation.  Genitourinary: Negative for vaginal bleeding, vaginal discharge and pelvic pain.  Musculoskeletal: Negative for myalgias.  Neurological: Negative for dizziness  and weakness.  Psychiatric/Behavioral: The patient is nervous/anxious.    Physical Exam  No data found.  Constitutional: Well-developed, well-nourished female in no acute distress.  Cardiovascular: normal rate and rhythm Respiratory: normal effort, clear to auscultate bilaterally GI: Abd soft, non-tender, gravid appropriate for gestational age.   No rebound or guarding. MS: Extremities nontender, no edema, normal ROM Neurologic: Alert and oriented x 4.  GU: Neg CVAT.   Bedside Ultrasound done.  Cephalic presentation. No movement of fetus.  Spine and ribs identified, then 4 chamber heart identified with no cardiac motion seen.  Formal US ordered.   Labs: No results found for this  or any previous visit (from the past 24 hour(s)). B/POS/-- (09/21 1447)  Imaging:  [place] Mfm Ob Detail +14 Wk  06/09/2015  OBSTETRICAL ULTRASOUND: This exam was performed within a [facility]. The OB US report was generated in the AS system, and faxed to the ordering physician.  This report is available in the [employer]. See the [facility] Obstetric US report via the Image Link.   MAU Course/MDM:  Patient and her spouse notified of findings. They were grieving appropriately, but patient was very distraught.  She asked me over and over if something could be done to save the baby. Asked if she could give the baby her heart.  I provided honest answers and support. I had the staff call the Chaplain for support. Consult Dr Gaynell Face with presentation, exam findings and test results.   Assessment: 1. IUFD (intrauterine fetal death)   2. Ultrasound recheck of fetal pyelectasis, antepartum, not applicable or unspecified fetus   3. Family history of genetic disorder   4. Decreased fetal movement     Plan: Admit to [employer] Routine orders RN will call Dr Gaynell Face with cervix exam and get induction orders    Medication List    ASK your doctor about these medications        calcium carbonate 500 MG chewable tablet  Commonly known as:  TUMS  Chew 1 tablet (200 mg of elemental calcium total) by mouth 3 (three) times daily with meals.     CITRANATAL HARMONY 30-1-260 MG Caps  Take 1 tablet by mouth daily.     loratadine 10 MG tablet  Commonly known as:  CLARITIN  Take 10 mg by mouth daily as needed for allergies (allergies). Reported on 06/16/2015     omeprazole 20 MG capsule  Commonly known as:  PRILOSEC  Take 1 capsule (20 mg total) by mouth 2 (two) times daily before a meal.     promethazine 25 MG tablet  Commonly known as:  PHENERGAN  Take 1 tablet (25 mg total) by mouth every 6 (six) hours as needed for nausea or vomiting.     sertraline 100 MG tablet   Commonly known as:  ZOLOFT  Take 1 tablet (100 mg total) by mouth daily.        Wynelle Bourgeois CNM, MSN Certified Nurse-Midwife 07/04/2015 1:41 PM

## 2015-07-04 NOTE — Progress Notes (Signed)
Pt arrived from MAU after learning infant is an IUFD.  Pt's emotional state is extremely fragile.  Attempting to educate on the induction process with the chaplain at the bedside.   Family is concerned more at this point with their finances, missing work and not wanting to delivery vaginally.  Education given. Social Services contacted and updated on pt situations.  Will be by to see patient this afternoon. Chaplain contacting family friends to come help with childcare of their 2 oldest children.  Dr Gaynell FaceMarshall updated on status of induction at this point.  Call MD when pt is settled.

## 2015-07-04 NOTE — MAU Note (Signed)
Pt states no fetal movement today, and very little movement yesterday

## 2015-07-04 NOTE — Progress Notes (Addendum)
Had transition conversation with the daytime chaplain concerning Ms Mallory Craig and her unborn baby, [name].  When chaplain visited [name] husband was by her side along with a church pastor and friends. These were offering support and comfort to Ms Mallory Craig.  Ms Mallory Craig is still in the opening stages of grief. Her primary desire is to go to sleep and "it all be over with" or "I will awaken to find this is a bad dream." Chaplain asked if she was praying that the medical tests were false and that [name] will be birthed alive. She responded she no long held that hope. With that realization Ms Mallory Craig began the long journey of grief. At present there is not scheduled time for delivery. This will be a traumatic event for Ms Mallory Craig and her husband. Chaplains should be paged and present to assist the staff in dealing with the profound grief that will follow this event.  Night chaplain should be paged immediately if Ms Mallory Craig needs, or requests further spiritual and emotional care.  Benjie Karvonenharles D. Hashim Eichhorst, DMin, MDiv, MA Chaplain

## 2015-07-04 NOTE — Progress Notes (Signed)
Family friends came by to collect their children.  Visitors at bedside.  When visitors leave, will start induction process.

## 2015-07-05 ENCOUNTER — Encounter (HOSPITAL_COMMUNITY): Payer: Self-pay | Admitting: *Deleted

## 2015-07-05 ENCOUNTER — Encounter (HOSPITAL_COMMUNITY)
Admission: AD | Disposition: A | Discharge status: Home / Self Care | Payer: Self-pay | Source: Ambulatory Visit | Attending: Obstetrics & Gynecology

## 2015-07-05 ENCOUNTER — Inpatient Hospital Stay (HOSPITAL_COMMUNITY): Payer: Managed Care, Other (non HMO)

## 2015-07-05 ENCOUNTER — Observation Stay (HOSPITAL_COMMUNITY): Payer: Managed Care, Other (non HMO) | Admitting: Anesthesiology

## 2015-07-05 DIAGNOSIS — Z3A26 26 weeks gestation of pregnancy: Secondary | ICD-10-CM | POA: Diagnosis not present

## 2015-07-05 DIAGNOSIS — O322XX0 Maternal care for transverse and oblique lie, not applicable or unspecified: Secondary | ICD-10-CM

## 2015-07-05 DIAGNOSIS — O364XX0 Maternal care for intrauterine death, not applicable or unspecified: Principal | ICD-10-CM

## 2015-07-05 DIAGNOSIS — O368120 Decreased fetal movements, second trimester, not applicable or unspecified: Secondary | ICD-10-CM | POA: Diagnosis present

## 2015-07-05 SURGERY — Surgical Case
Anesthesia: Regional

## 2015-07-05 MED ORDER — LIDOCAINE HCL (PF) 1 % IJ SOLN
INTRAMUSCULAR | Status: DC | PRN
  Administered 2015-07-05: 4 mL
  Administered 2015-07-05: 6 mL via EPIDURAL

## 2015-07-05 MED ORDER — SERTRALINE HCL 100 MG PO TABS
100.0000 mg | ORAL_TABLET | Freq: Every day | ORAL | Status: DC
  Administered 2015-07-05 – 2015-07-06 (×2): 100 mg via ORAL
  Filled 2015-07-05 (×4): qty 1

## 2015-07-05 MED ORDER — OXYTOCIN 10 UNIT/ML IJ SOLN
INTRAVENOUS | Status: AC
  Filled 2015-07-05: qty 4

## 2015-07-05 MED ORDER — MISOPROSTOL 200 MCG PO TABS
200.0000 ug | ORAL_TABLET | ORAL | Status: DC
  Administered 2015-07-05 (×3): 200 ug via VAGINAL
  Filled 2015-07-05 (×4): qty 1

## 2015-07-05 MED ORDER — BUPIVACAINE LIPOSOME 1.3 % IJ SUSP: 20.0000 mL | Freq: Once | INTRAMUSCULAR | Status: DC

## 2015-07-05 MED ORDER — PROMETHAZINE HCL 25 MG/ML IJ SOLN
12.5000 mg | Freq: Four times a day (QID) | INTRAMUSCULAR | Status: DC | PRN
  Administered 2015-07-05: 12.5 mg via INTRAVENOUS
  Filled 2015-07-05: qty 1

## 2015-07-05 NOTE — Progress Notes (Signed)
Offered continued support to family who had been seen by Chaplains Mallory Craig and Mallory Craig as well.  Mallory Craig is grieving and continuing to ask questions, both logistical and spiritual as she continues to make sense of her loss.  She is tearful on and off as is her husband, Mallory Craig.  Mallory Craig has been a good support for her and it is clear that she looks to him for that support.  We will continue to check in on them throughout the day, but please also page as needs arise.  Chaplain Mallory Craig, Bcc Pager, [phone] 11:53 AM    07/05/15 1100  Clinical Encounter Type  Visited With Patient and family together  Visit Type Spiritual support  Referral From Chaplain;Nurse  Spiritual Encounters  Spiritual Needs Emotional;Grief support  Stress Factors  Patient Stress Factors Loss (Perinatal loss)

## 2015-07-05 NOTE — Progress Notes (Signed)
Offered support on and off throughout the day.  Family continues to ask questions and make plans as a way of coping with the unknown.    Please page as needs arise during the evening.  I will plan to check in on family tomorrow.  Chaplain Dyanne CarrelKaty Shela Esses, Bcc Pager, [phone] 4:25 PM    07/05/15 1600  Clinical Encounter Type  Visited With Patient and family together  Visit Type Spiritual support

## 2015-07-05 NOTE — Progress Notes (Signed)
Mallory AnonJacqueline Craig is a 25 y.o. G3P2002 at 8762w3d by LMP admitted for induction of labor due to IUFD.  Subjective:   Objective: BP 138/74 mmHg  Pulse 84  Temp(Src) 98 F (36.7 C) (Oral)  Resp 17  Ht 5\' 7"  (1.702 m)  Wt 142 lb (64.411 kg)  BMI 22.24 kg/m2  SpO2 100%  LMP 01/01/2015 I/O last 3 completed shifts: In: -  Out: 600 [Urine:600]    FHT:  Absent UC:   No toco SVE:   Dilation: 10 Effacement (%): 100 Station: 0 Exam by:: rochelle denney,cnm  Labs: Lab Results  Component Value Date   WBC 17.5* 07/04/2015   HGB 13.6 07/04/2015   HCT 39.5 07/04/2015   MCV 89.8 07/04/2015   PLT 195 07/04/2015    Assessment / Plan: 26.3 weeks.  IUFD.  IOL and fully dilated with oblique lie with shoulder presenting.  Will proceed with C/S.   Freddy Kinne A 07/05/2015, 7:49 PM

## 2015-07-05 NOTE — Plan of Care (Signed)
Resumed care for pt from day shift RN. Pt concerns and questions answered about fears of cs. Dr. Clearance CootsHarper was asked to return to room for patient concern of waiting for her body to deliver without the need for cs. After discussion of waiting pt called out and wanted a second opinion for delivery method from a different provider.  2020 Dr. Clearance CootsHarper spoke with Dr. Debroah LoopArnold about resuming care of the patient per her request. 2045 Dr. Debroah LoopArnold at bedside assessing the patient and situation of fetus, care resumed. Dr. Debroah LoopArnold had the patient push for approximately 10 minutes and delivered a female fetus at 2055.

## 2015-07-05 NOTE — Progress Notes (Signed)
Mallory AnonJacqueline Craig is a 25 y.o. G3P2002 at 636w3d by LMP admitted for induction of labor due to IUFD.  Subjective:   Objective: BP 129/75 mmHg  Pulse 77  Temp(Src) 98.1 F (36.7 C) (Oral)  Resp 18  Ht 5\' 7"  (1.702 m)  Wt 142 lb (64.411 kg)  BMI 22.24 kg/m2  SpO2 100%  LMP 01/01/2015 I/O last 3 completed shifts: In: -  Out: 600 [Urine:600]    FHT:  Absent UC:  Toco off SVE:   Dilation: 8 Effacement (%): 100 Station: +1, +2 Exam by:: LCarpenter,RN  Labs: Lab Results  Component Value Date   WBC 17.5* 07/04/2015   HGB 13.6 07/04/2015   HCT 39.5 07/04/2015   MCV 89.8 07/04/2015   PLT 195 07/04/2015    Assessment / Plan: 26 weeks.  Transverse to Oblique lie.  Cesarean section recommended but patient refuses, and furthermore does not want my continued care.  She requested a second opinion from Faculty service, and then requested Dr, Debroah LoopArnold to assume care.  I discussed the situation with Dr. Debroah LoopArnold and he agrees to assume care of the patient.   Mallory Craig A 07/05/2015, 8:50 PM

## 2015-07-05 NOTE — Anesthesia Preprocedure Evaluation (Signed)

## 2015-07-05 NOTE — Anesthesia Procedure Notes (Signed)
Epidural Patient location during procedure: OB  Preanesthetic Checklist Completed: patient identified, site marked, surgical consent, pre-op evaluation, timeout performed, IV checked, risks and benefits discussed and monitors and equipment checked  Epidural Patient position: sitting Prep: site prepped and draped and DuraPrep Patient monitoring: continuous pulse ox and blood pressure Approach: midline Location: L3-L4 Injection technique: LOR air  Needle:  Needle type: Tuohy  Needle gauge: 17 G Needle length: 9 cm and 9 Needle insertion depth: 4 cm Catheter type: closed end flexible Catheter size: 19 Gauge Catheter at skin depth: 10 cm Test dose: negative  Assessment Events: blood not aspirated, injection not painful, no injection resistance, negative IV test and no paresthesia  Additional Notes Dosing of Epidural:  1st dose, through catheter .............................................  Xylocaine 40 mg  2nd dose, through catheter, after waiting 3 minutes.........Xylocaine 60 mg    As each dose occurred, patient was free of IV sx; and patient exhibited no evidence of SA injection.  Patient is more comfortable after epidural dosed. Please see RN's note for documentation of vital signs,and FHR which are stable.  Patient reminded not to try to ambulate with numb legs, and that an RN must be present when she attempts to get up.       

## 2015-07-05 NOTE — H&P (Signed)
This is Dr. Francoise CeoBernard Marshall dictating the history and physical on  Mallory Craig  she is a 25 year old gravida 3 para 20 02 at 7626 weeks and 3 days [facility] she was admitted with a fetal demise yesterday confirmed by ultrasound patient states that the day prior to admission she felt the  Baby move  just a small amount and   so she was admitted admitted last night and she's had Cytotec 25 Max placed 20 she's not contracting and her cervix was long closed she did not have any problems with this pregnancy Past medical history negative Past surgical history negative Social history negative System review negative Physical exam well-developed female not in labor HEENT negative Lungs clear to P&A Heart regular rhythm no murmurs no gallops Breasts negative Abdomen 26 week size Pelvic cervix long closed Extremities negative

## 2015-07-06 MED ORDER — ACETAMINOPHEN 325 MG PO TABS: 650.0000 mg | ORAL_TABLET | ORAL | Status: DC | PRN

## 2015-07-06 MED ORDER — ONDANSETRON HCL 4 MG PO TABS: 4.0000 mg | ORAL_TABLET | ORAL | Status: DC | PRN

## 2015-07-06 MED ORDER — SERTRALINE HCL 100 MG PO TABS: 200.0000 mg | ORAL_TABLET | Freq: Every day | ORAL | Status: DC

## 2015-07-06 MED ORDER — BENZOCAINE-MENTHOL 20-0.5 % EX AERO: 1.0000 "application " | INHALATION_SPRAY | CUTANEOUS | Status: DC | PRN

## 2015-07-06 MED ORDER — TETANUS-DIPHTH-ACELL PERTUSSIS 5-2.5-18.5 LF-MCG/0.5 IM SUSP: 0.5000 mL | Freq: Once | INTRAMUSCULAR | Status: DC

## 2015-07-06 MED ORDER — DIBUCAINE 1 % RE OINT: 1.0000 "application " | TOPICAL_OINTMENT | RECTAL | Status: DC | PRN

## 2015-07-06 MED ORDER — IBUPROFEN 600 MG PO TABS
600.0000 mg | ORAL_TABLET | Freq: Four times a day (QID) | ORAL | Status: DC
  Administered 2015-07-06 (×2): 600 mg via ORAL
  Filled 2015-07-06 (×2): qty 1

## 2015-07-06 MED ORDER — ZOLPIDEM TARTRATE 5 MG PO TABS: 5.0000 mg | ORAL_TABLET | Freq: Every evening | ORAL | Status: DC | PRN

## 2015-07-06 MED ORDER — PRENATAL MULTIVITAMIN CH: 1.0000 | ORAL_TABLET | Freq: Every day | ORAL | Status: DC

## 2015-07-06 MED ORDER — OXYCODONE-ACETAMINOPHEN 5-325 MG PO TABS: 2.0000 | ORAL_TABLET | ORAL | Status: DC | PRN

## 2015-07-06 MED ORDER — OXYCODONE-ACETAMINOPHEN 5-325 MG PO TABS: 1.0000 | ORAL_TABLET | ORAL | Status: DC | PRN

## 2015-07-06 MED ORDER — DIPHENHYDRAMINE HCL 25 MG PO CAPS: 25.0000 mg | ORAL_CAPSULE | Freq: Four times a day (QID) | ORAL | Status: DC | PRN

## 2015-07-06 MED ORDER — WITCH HAZEL-GLYCERIN EX PADS: 1.0000 "application " | MEDICATED_PAD | CUTANEOUS | Status: DC | PRN

## 2015-07-06 MED ORDER — ONDANSETRON HCL 4 MG/2ML IJ SOLN: 4.0000 mg | INTRAMUSCULAR | Status: DC | PRN

## 2015-07-06 MED ORDER — SENNOSIDES-DOCUSATE SODIUM 8.6-50 MG PO TABS
2.0000 | ORAL_TABLET | ORAL | Status: DC
  Administered 2015-07-06: 2 via ORAL
  Filled 2015-07-06: qty 2

## 2015-07-06 MED ORDER — SIMETHICONE 80 MG PO CHEW: 80.0000 mg | CHEWABLE_TABLET | ORAL | Status: DC | PRN

## 2015-07-06 MED ORDER — HYDROXYZINE HCL 25 MG PO TABS: 25.0000 mg | ORAL_TABLET | Freq: Four times a day (QID) | ORAL | Status: DC | PRN

## 2015-07-06 MED ORDER — IBUPROFEN 600 MG PO TABS: 600.0000 mg | ORAL_TABLET | Freq: Four times a day (QID) | ORAL | Status: DC

## 2015-07-06 MED ORDER — LANOLIN HYDROUS EX OINT: TOPICAL_OINTMENT | CUTANEOUS | Status: DC | PRN

## 2015-07-06 NOTE — Progress Notes (Signed)
I spent time with Mallory Craig and Mallory Coombson today after delivery.  She was continuing to grieve and question and wonder why her son had to die.  When she mentioned that she did not deserve to live while died, we had a conversation about getting support right away if she ever feels like hurting herself.  I alerted Mallory BooksSarah Venning, LCSW, and spoke with her nurse so that the medical team could address any mental health concerns from a medical perspective.  I affirmed her role as a mother to her daughters (ages 213 and 2) and helped her to think about how she could continue to parent her baby, Mallory Craig, even though he is no longer with her physically.  I offered emotional and grief support as well as grief education to her and her husband and also accompanied them to their car as they left the hospital without their baby.  Chaplain Mallory Craig, Bcc Pager, [phone] 4:17 PM    07/06/15 1600  Clinical Encounter Type  Visited With Patient  Visit Type Spiritual support  Referral From Nurse  Consult/Referral To Social work  Spiritual Encounters  Spiritual Needs Emotional;Grief support  Stress Factors  Patient Stress Factors (Perinatal loss)

## 2015-07-06 NOTE — Anesthesia Postprocedure Evaluation (Signed)
Anesthesia Post Note  Patient: Mallory Craig AnonJacqueline Craig  Procedure(s) Performed: Procedure(s) (LRB): CESAREAN SECTION (N/A)  Patient location during evaluation: [facility] Anesthesia Type: Epidural Level of consciousness: awake Pain management: pain level controlled Vital Signs Assessment: post-procedure vital signs reviewed and stable Respiratory status: spontaneous breathing Cardiovascular status: stable Postop Assessment: no headache, no backache, epidural receding, patient able to bend at knees, no signs of nausea or vomiting and adequate PO intake Anesthetic complications: no    Last Vitals:  Filed Vitals:   07/06/15 0200 07/06/15 0600  BP: 107/61 102/55  Pulse: 63 69  Temp: 36.9 C 36.6 C  Resp: 18 18    Last Pain:  Filed Vitals:   07/06/15 0601  PainSc: Asleep                 [name]

## 2015-07-06 NOTE — Clinical Social Work Note (Signed)
Clinical Social Work Assessment  Patient Details  Name: Mallory Craig MRN: [id number] Date of Birth: 12-20-1990  Date of referral:  07/06/15               Reason for consult:  Grief and Loss, Mental Health Concerns                Permission sought to share information with:  Other-- care providers at the hospital Permission granted to share information::  Yes, Verbal Permission Granted  Name::        Agency::     Relationship::     Contact Information:     Housing/Transportation Living arrangements for the past 2 months:  Single Family Home Source of Information:  Patient, Partner, Chaplain Patient Interpreter Needed:  None Criminal Activity/Legal Involvement Pertinent to Current Situation/Hospitalization:  No - Comment as needed Significant Relationships:  Church, Spouse, Dependent Children, Other Family Members Lives with:  Minor Children, Spouse Do you feel safe going back to the place where you live?  Yes Need for family participation in patient care:  Yes. Patient expressed interest in having her partner involved in her care.   Care giving concerns:  N/A  Office manager / plan:   CSW received request for consult from chaplain, Dyanne Carrel, due to acute mental health concerns s/p IUFD. Patient and her partner presented as easily engaged, receptive to the visit, and eager to create a plan to support her mental health as she discharges home.    Patient and her partner expressed appreciation for the ongoing support from hospital staff.  Patient expressed anxiety and worry about her ability to cope when she is discharged home since she will not have the immediate support from providers.  She discussed what she has enjoyed and what has been helpful with staff, and recognized that these supports can be replicated in the community.  Patient expressed presence of strong support system through their church, family, and friends. She stated that she is concerned that she will be  a burden, but recognizes that they have vocalized their interest, intention, and desire to be involved and supportive. She shared that knows that she can reach out for help, and finds it helpful to be interacting and talking with others for support.  Patient also stated that it will be helpful for her to have distractions. She shared that she enjoys spending time with her daughters, and knows that she will need to make plans to go for walks, go to the park, and genuinely engage in the present moment with them.  She smiled as she discussed her role as a mother, and shared that she finds fulfillment as a mother.    Patient confirmed that her dose of Zoloft has increased as she transitions home. She stated that she is in agreement of the increase since she believes that it will be helpful for her.  Patient expressed interest in following up with [facility], but was also interested in participating in individual therapy.  Per patient, she has a history of anxiety, depression, and postpartum depression, and has previously been in therapy and found it helpful (most recently two years ago).   CSW also reviewed safety/emergnecy resources in the event of a mental health crises.   Patient's partner expressed concern about patient's mental health.  He stated that patient had expressed thoughts of wanting to die the previous evening.  Patient confirmed that she wished she had died instead of the infant, and  she continued to process her thoughts of suicide that she was experiencing.  Patient shared that she informed her partner of these thoughts since she wants him to know how she feeling and what she is thinking.  Patient expressed gratitude for the ways in which her partner responded, stating that it helped her to be reminded that she does have a role and purpose in life.  She stated that she wants to be a mother for her daughters, and recognizes that she cannot be the mother she wants to be for them if  she died.  Patient shared that she also believes that she has a new role in life, which includes providing support and creating support for other family members who have experienced an infant loss.  Patient encouraged her partner to continue to respond in the ways in which he has since she finds it helpful and supportive. She also agreed that she will need to refocus on her daughters if she feels suicidal in the future.    CSW continued to explore with patient cognitive techniques that will assist her to cope with suicidal thoughts if they occur in the future, and reviewed mental health crises/emergency resources if needs arise.  Patient denied current suicidal thoughts.    Patient continues to express feeling guilty and responsible for the infant's loss, but also acknowledges that her thoughts are irrational since there is no evidence or proof that her behaviors were linked with the loss. Patient continues to cope with knowing that she is not responsible, and accepting that sometimes there is no reason for a loss.  By the end of the visit, patient was smiling and displaying a full range in affect. She was engaged in a linear and future orientated thought process. While she was expressing normative range of thoughts and feelings related to her grief and loss, she also expressed feeling hopeful for the future.   Employment status:  [employer] Insurance information:  [place] PT Recommendations:  Not assessed at this time Information / Referral to community resources:  Outpatient Psychiatric Care-- given list of providers in area.  Patient/Family's Response to care:  Patient and her partner expressed appreciation for the support from all providers at the hospital.  Patient and her partner were readily and easily engaged with CSW.   Patient/Family's Understanding of and Emotional Response to Diagnosis, Current Treatment, and Prognosis:  Patient and her partner present with an  understanding of the stages of grief and loss.  Patient was able to acknowledge and verbalize that her thoughts and feelings are normal. Patient and her partner present with insight and knowledge related to the patient's increased risk for perinatal mood disorders and mental health crises due to the acuity of the situation and her mental health history.   Emotional Assessment Appearance:  Appears stated age, Developmentally appropriate Attitude/Demeanor/Rapport:  Crying, Grieving Affect (typically observed):  Tearful/Crying, Grieving Orientation:  Oriented to Self, Oriented to Place, Oriented to  Time, Oriented to Situation Alcohol / Substance use:  Not Applicable Psych involvement (Current and /or in the community):  Is currently prescribed Zoloft  Discharge Needs  Concerns to be addressed:  Mental Health Concerns Readmission within the last 30 days:  No Current discharge risk:  History of anxiety, depression, and postpartum depression. Experienced IUFD, and presents with increased risk for mental health crises.  Barriers to Discharge:  No Barriers Identified   Kelby FamVenning, Willis Holquin N, LCSW 07/06/2015, 1:35 PM

## 2015-07-06 NOTE — Discharge Instructions (Signed)
If your symptoms of depression worsen before your clinic visit, please call to seek further advice. If you have thoughts of harming yourself or others, please seek immediate medical care.   We have prescribed you with Atarax for your anxiety. You can take 1 tablet every 6 hours as needed for anxiety. This medication may cause you to feel sleepy.  We also increased the dose of your Zoloft to 200mg  daily.   Stillbirth Stillbirth, also called intrauterine fetal demise (IUFD), is the loss of a baby after 20 weeks of pregnancy and before delivery. A stillborn baby does not show any signs of life, such as a heartbeat or breathing. Most women recognize the loss of the baby soon after it happens, due to the absence of fetal movements.  WHAT CAUSES STILLBIRTH? The cause is often unknown. Sometimes stillbirth may be caused by:  An abnormality in the umbilical cord.  An abnormality in the placenta.  The placenta pulling away from the wall of the uterus prematurely (placental abruption).  A birth defect, genetic disorder, or severe infection in the baby that causes him or her to develop abnormally.  Poor fetal growth.  Long-term health problems in the mother.  High blood pressure during the pregnancy (preeclampsia).  Infection in the uterus (chorioamnionitis).  Severe trauma to the mother's abdomen.  Incompatibility between the mother's blood and the baby's blood (Rh disease).  Interrupted blood supply to the baby during a difficult delivery (fetal asphyxia). WHAT INCREASES THE RISK OF STILLBIRTH?  There is an increased risk of stillbirth if:  You have a medical condition such as diabetes, high blood pressure, or blood clotting problems.  You use illegal drugs.  You drink alcohol.  You smoke.  You are pregnant with two or more babies.  You have a postterm pregnancy, and problems with the placenta or umbilical cord develop.  You are of African-American heritage. WHAT ARE SIGNS AND  SYMPTOMS OF STILLBIRTH?  Your abdomen stops getting bigger.  You do not feel your baby move around or kick.  Signs of intrauterine infection such as:  Fever.  Abdominal pain, cramping, or tenderness.  Back pain.  Racing heartbeat.  Vaginal discharge that smells bad. HOW IS STILLBIRTH DIAGNOSED? Stillbirth may be suggested by the findings of a physical exam. The diagnosis must be confirmed with an ultrasound exam that allows your health care provider to see the baby inside the womb. This type of ultrasound shows many changes that can confirm the diagnosis of a stillbirth, including:  The absence of a heartbeat.  The absence of fetal movement.  Changes in the appearance of the bones of your baby's skull.  Changes in the appearance of the placenta or umbilical cord. HOW WILL MY BABY BE DELIVERED? Most stillborn babies are delivered vaginally. Occasionally, a stillborn baby may be delivered by cesarean section. You may be able to choose to start labor artificially (labor induction) or to allow labor to begin naturally. If you choose to allow labor to begin naturally, labor will typically begin within 2-3 weeks of your baby's death. You will be monitored closely during this time for signs of infection or blood clotting problems. If labor does not start on its own during this time, you will need a labor induction. WHAT WILL HAPPEN AFTER DELIVERY?  Parents are encouraged to hold their baby. You may bathe your baby, dress your baby, and sing or talk to your baby. Doing these things is normal and may help you feel close to your child.  It may also help you to grieve and begin to gain closure.  You may be offered the opportunity to have your baby examined to determine if problems were present that could happen again in a future pregnancy.  If you lost your baby because of an infection, you may be given medicine to treat the infection.  You can expect to experience typical postpartum body  changes, such as vaginal blood loss for up to 6 weeks after delivery, and breast milk production. Your health care provider will help you manage these changes. WHAT ARE THE COMPLICATIONS OF STILLBIRTH? Stillbirth does not usually put the mother's health in danger. However, sometimes it leads to complications. These may include:  Disseminated intravascular coagulation. This is a problem with blood clotting that can lead to severe bleeding.  Infection of the uterus.  Increased bleeding from the uterus.  Sadness, grief, frustration, guilt, confusion, and anger. These are all common and normal emotions the develop after stillbirth. HOW SHOULD I CARE FOR MYSELF AT HOME BEFORE MY BABY IS DELIVERED?  Keep all follow-up visits as directed by your health care provider. This is important.  Take medicines only as directed by your health care provider.  Seek help from a trained counselor to assist you in working through your emotions.  Do not drink alcohol, especially if you are taking medicine to relieve pain.  Do not use any tobacco products including cigarettes, chewing tobacco, or electronic cigarettes. If you need help quitting, ask your health care provider.  Do not use illegal drugs. HOW SHOULD I CARE FOR MYSELF AT HOME AFTER MY BABY IS DELIVERED?  Continue working with a trained counselor for as long as it is helpful to you.  If your breasts become engorged, apply warm compresses as often as needed to relieve pressure or pain. Seek the care of a lactation counselor or consultant if this does not help.  Do not douche or use tampons.  Talk to your health care provider about when it is safe to resume sexual activities. This depends on your risk of infection, your rate of healing, and your comfort and desire to resume sexual activity.  Take showers instead of baths until your health care provider tells you it is okay.  Ask your health care provider when you can return to driving and to  your everyday activities.  Do not drink alcohol, especially if you are taking medicine to relieve pain.  Do not use any tobacco products including cigarettes, chewing tobacco, or electronic cigarettes. If you need help quitting, ask your health care provider.  Do not use illegal drugs. WHEN SHOULD I SEEK MEDICAL CARE? You should seek medical care if:  You are having trouble eating or sleeping.  You develop hemorrhoids.  You continue to experience grief, sadness, or lack of motivation for everyday activities that does not improve over time.  You cannot enjoy the things in life you have previously enjoyed.  You have problems urinating including:  Urinating more often than normal.  Pain or burning with urination.  You have painful, hard, or reddened breasts.  You have not had a menstrual period by the 12th week after delivery. WHEN SHOULD I SEEK IMMEDIATE MEDICAL CARE? You should seek immediate medical care if:  You have heavy vaginal bleeding.  Your vaginal bleeding continues for more than 6 weeks.  You pass blood clots for more than 4 weeks.  You have pain or bleeding with urination.  You have abnormal vaginal discharge.  You have a  fever or chills.  You have abdominal pain.  You have chest pain.  You have leg pain, swelling, or redness.  You have shortness of breath.  You have a severe headache, blurred vision, or spots in your vision.  You have thoughts of harming yourself or others.   This information is not intended to replace advice given to you by your health care provider. Make sure you discuss any questions you have with your health care provider.   Document Released: 09/14/2008 Document Revised: 07/09/2014 Document Reviewed: 12/03/2013 Elsevier Interactive Patient Education [employer]2016 Elsevier Inc.

## 2015-07-06 NOTE — Discharge Summary (Signed)
OB Discharge Summary     Patient Name: Mallory Craig DOB: 1990-10-03 MRN: [id number]  Date of admission: 07/04/2015 Delivering MD: Adam Phenix   Date of discharge: 07/06/2015  Admitting diagnosis: 26WKS,NO MOVEMENT Intrauterine pregnancy: [redacted]w[redacted]d     Secondary diagnosis:  Active Problems:   IUFD (intrauterine fetal death)  Additional problems: shoulder presentation at delivery     Discharge diagnosis: stillbirth at 54 weeks                                                                                                Post partum procedures:   Augmentation: Cytotec  Complications: [facility] course:  Onset of Labor With Vaginal Delivery     25 y.o. yo [id number] at [redacted]w[redacted]d was admitted with diagnosis of fetal demise on 07/04/2015. Patient had an uncomplicated labor course as follows:  Membrane Rupture Time/Date: 7:19 PM ,07/05/2015   Intrapartum Procedures: Episiotomy: None [1]                                         Lacerations:  None [1]  Patient had a delivery of a Non Viable infant. 07/05/2015  Information for the patient's newborn:  Zuleika, Gallus [[id number]]  Delivery Method: Vaginal, Spontaneous Delivery (Filed from Delivery Summary)    Pateint had an uncomplicated postpartum course.  She is ambulating, tolerating a regular diet, passing flatus, and urinating well. Patient is discharged home in stable condition on 07/06/2015.    Physical exam  Filed Vitals:   07/05/15 2301 07/06/15 0110 07/06/15 0200 07/06/15 0600  BP: 114/60 120/54 107/61 102/55  Pulse: 71 74 63 69  Temp:  98.9 F (37.2 C) 98.5 F (36.9 C) 97.9 F (36.6 C)  TempSrc:      Resp:  18 18 18   Height:      Weight:      SpO2:       General: alert and cooperative Lochia: appropriate Uterine Fundus: firm Incision: N/A DVT Evaluation: No evidence of DVT seen on physical exam. Labs: Lab Results  Component Value Date   WBC 17.5* 07/04/2015   HGB 13.6 07/04/2015   HCT 39.5  07/04/2015   MCV 89.8 07/04/2015   PLT 195 07/04/2015   CMP Latest Ref Rng 11/30/2014  Glucose 65 - 99 mg/dL 97  BUN 6 - 20 mg/dL 16  Creatinine 9.62 - 9.52 mg/dL 8.41  Sodium 324 - 401 mmol/L 139  Potassium 3.5 - 5.1 mmol/L 4.8  Chloride 101 - 111 mmol/L 107  CO2 22 - 32 mmol/L 23  Calcium 8.9 - 10.3 mg/dL 9.0  Total Protein 6.5 - 8.1 g/dL 6.4(L)  Total Bilirubin 0.3 - 1.2 mg/dL 0.7  Alkaline Phos 38 - 126 U/L 70  AST 15 - 41 U/L 28  ALT 14 - 54 U/L 11(L)    Discharge instruction: per After Visit Summary and "Baby and Me Booklet".  After visit meds:    Medication List    STOP taking these medications  omeprazole 20 MG capsule  Commonly known as:  PRILOSEC     promethazine 25 MG tablet  Commonly known as:  PHENERGAN      TAKE these medications        calcium carbonate 500 MG chewable tablet  Commonly known as:  TUMS  Chew 1 tablet (200 mg of elemental calcium total) by mouth 3 (three) times daily with meals.     CITRANATAL HARMONY 30-1-260 MG Caps  Take 1 tablet by mouth daily.     ibuprofen 600 MG tablet  Commonly known as:  ADVIL,MOTRIN  Take 1 tablet (600 mg total) by mouth every 6 (six) hours.     loratadine 10 MG tablet  Commonly known as:  CLARITIN  Take 10 mg by mouth daily as needed for allergies (allergies). Reported on 06/16/2015     sertraline 100 MG tablet  Commonly known as:  ZOLOFT  Take 1 tablet (100 mg total) by mouth daily.        Diet: routine diet  Activity: Advance as tolerated. Pelvic rest for 6 weeks.   Outpatient follow up:2 weeks Follow up Appt:Future Appointments Date Time Provider Department Center  07/15/2015 9:00 AM [facility]  07/15/2015 9:15 AM Roe Coombsachelle A Denney[facility]  07/21/2015 10:00 AM [facility] US 1 [facility] 203   Follow up Visit:No Follow-up on file.  Postpartum contraception: Undecided  Newborn Data: Live born female  Birth Weight: 1 lb 13 oz (822 g) APGAR: 0, 0  Baby Feeding:    Disposition:morgue   07/06/2015 Scheryl DarterARNOLD,Mell Guia, MD

## 2015-07-06 NOTE — Progress Notes (Signed)
Discharge instructions reviewed with patient.  Patient states understanding of home care, medications, activity, signs/symptoms to report to MD and return MD office visit.  Patients significant other and family will assist with her care @ home.  No home  equipment needed, patient has prescriptions and all personal belongings.  Patient ambulated for discharge in stable condition with chaplain without incident.

## 2015-07-11 ENCOUNTER — Telehealth: Payer: Self-pay | Admitting: *Deleted

## 2015-07-11 NOTE — Telephone Encounter (Addendum)
Pt left message stating that she delivered her baby @ [redacted] weeks gestation recently and the baby did not survive. She has questions about FMLA.  Per chart review, pt delivered IUFD on 1/3. She was receiving prenatal care by Dr. Clearance CootsHarper but asked to have care by Dr. Debroah LoopArnold while in labor due to Dr. Clearance CootsHarper recommended C/S due to malpresentation of fetus and pt did not want surgical delivery. Pt had uncomplicated vaginal delivery by Dr. Debroah LoopArnold.   1/10  1141  Pt left second message with request for FMLA. She further stated that it will be for her husband to help her with recovery and home duties as she is having a hard time after the loss of her baby. I spoke with Dr. Debroah LoopArnold who stated that he would approve the FMLA request if pt is going to have PP care with our office. It was his impression that pt would go back to midwife @ Dr. Ver[facility]. I called pt to clarify her plans for PP care and was not able to speak with her. I left a message stating that I have information from Dr. Debroah LoopArnold and also a question. I will call back later today.   1/10  1440  Called pt and discussed her plan for PP care. She stated that she desires to be seen @ our office instead of returning to Dr. Verdell CarmineHarper's office. I advised that Dr. Debroah LoopArnold has agreed to approve FMLA for her husband. Pt stated that he got 5 days bereavement which ended yesterday. She would like the FMLA papers to reflect the start date of FMLA as today and return to work on 07/26/15. If she is feeling better prior to that date, he may be able to return to work sooner. She will bring the papers tomorrow and was advised of the need to sign Release of Information.  Pt voiced understanding of all information given.

## 2015-07-12 NOTE — Telephone Encounter (Signed)
Patient returned call, requests the nurse who called her please call her back. She needs to know if her husband's FMLA will be approved as she does not want him to lose his job.

## 2015-07-14 ENCOUNTER — Other Ambulatory Visit: Payer: Managed Care, Other (non HMO)

## 2015-07-15 ENCOUNTER — Other Ambulatory Visit: Payer: Managed Care, Other (non HMO)

## 2015-07-15 ENCOUNTER — Encounter: Payer: Managed Care, Other (non HMO) | Admitting: Certified Nurse Midwife

## 2015-07-21 ENCOUNTER — Encounter: Payer: Self-pay | Admitting: *Deleted

## 2015-07-21 ENCOUNTER — Encounter: Payer: Self-pay | Admitting: Obstetrics & Gynecology

## 2015-07-21 ENCOUNTER — Ambulatory Visit (HOSPITAL_COMMUNITY): Payer: Managed Care, Other (non HMO)

## 2015-07-21 ENCOUNTER — Ambulatory Visit (INDEPENDENT_AMBULATORY_CARE_PROVIDER_SITE_OTHER): Payer: Managed Care, Other (non HMO) | Admitting: Obstetrics & Gynecology

## 2015-07-21 NOTE — Progress Notes (Signed)
Patient ID: Mallory Craig, female   DOB: 1991/06/27, 25 y.o.   MRN: [id number] Subjective:     Mallory Craig is a 25 y.o. female who presents for a postpartum visit. She is 2 weeks postpartum following a spontaneous vaginal delivery of an IUFD at 26 weeks. I have fully reviewed the prenatal and intrapartum course. Postpartum depression screening: positive. Her Zoloft was increased from  per day to  per day due to her prior h/o depression.  She is interested in getting pregnant again right away.  She does not want contraception. She confirms adequate emotional support at home. Her depression is usually.  Pt has not f/u with counselors recommended.  Her depression is managed by her primary care provider.  The following portions of the patient's history were reviewed and updated as appropriate: allergies, current medications, past family history, past medical history, past social history, past surgical history and problem list.  Review of Systems Pertinent items are noted in HPI.   Objective:  Pt in NAD Exam deferred        Assessment:     2 weeks postpartum exam. Pap smear not done at today's visit.   H/o Depression and PP depression- sx improving on increased dosage of meds.   Plan:    1. Contraception: none 2. Continue Prozac  daily for 3 months then see primary care physician to see if needs to decrase back to  daily 3. Follow up in: 6 weeks with primary care provideror as needed .   4.  Keep PNV 5. >20 min spent in face to face discussion with pt and her partner  Eber Jones L. Harraway-Smith, M.D., [place]n Core

## 2015-07-21 NOTE — Patient Instructions (Signed)
Postpartum Depression and Baby Blues °The postpartum period begins right after the birth of a baby. During this time, there is often a great amount of joy and excitement. It is also a time of many changes in the life of the parents. Regardless of how many times a mother gives birth, each child brings new challenges and dynamics to the family. It is not unusual to have feelings of excitement along with confusing shifts in moods, emotions, and thoughts. All mothers are at risk of developing postpartum depression or the "baby blues." These mood changes can occur right after giving birth, or they may occur many months after giving birth. The baby blues or postpartum depression can be mild or severe. Additionally, postpartum depression can go away rather quickly, or it can be a long-term condition.  °CAUSES °Raised hormone levels and the rapid drop in those levels are thought to be a main cause of postpartum depression and the baby blues. A number of hormones change during and after pregnancy. Estrogen and progesterone usually decrease right after the delivery of your baby. The levels of thyroid hormone and various cortisol steroids also rapidly drop. Other factors that play a role in these mood changes include major life events and genetics.  °RISK FACTORS °If you have any of the following risks for the baby blues or postpartum depression, know what symptoms to watch out for during the postpartum period. Risk factors that may increase the likelihood of getting the baby blues or postpartum depression include: °· Having a personal or family history of depression.   °· Having depression while being pregnant.   °· Having premenstrual mood issues or mood issues related to oral contraceptives. °· Having a lot of life stress.   °· Having marital conflict.   °· Lacking a social support network.   °· Having a baby with special needs.   °· Having health problems, such as diabetes.   °SIGNS AND SYMPTOMS °Symptoms of baby blues  include: °· Brief changes in mood, such as going from extreme happiness to sadness. °· Decreased concentration.   °· Difficulty sleeping.   °· Crying spells, tearfulness.   °· Irritability.   °· Anxiety.   °Symptoms of postpartum depression typically begin within the first month after giving birth. These symptoms include: °· Difficulty sleeping or excessive sleepiness.   °· Marked weight loss.   °· Agitation.   °· Feelings of worthlessness.   °· Lack of interest in activity or food.   °Postpartum psychosis is a very serious condition and can be dangerous. Fortunately, it is rare. Displaying any of the following symptoms is cause for immediate medical attention. Symptoms of postpartum psychosis include:  °· Hallucinations and delusions.   °· Bizarre or disorganized behavior.   °· Confusion or disorientation.   °DIAGNOSIS  °A diagnosis is made by an evaluation of your symptoms. There are no medical or lab tests that lead to a diagnosis, but there are various questionnaires that a health care provider may use to identify those with the baby blues, postpartum depression, or psychosis. Often, a screening tool called the Edinburgh Postnatal Depression Scale is used to diagnose depression in the postpartum period.  °TREATMENT °The baby blues usually goes away on its own in 1-2 weeks. Social support is often all that is needed. You will be encouraged to get adequate sleep and rest. Occasionally, you may be given medicines to help you sleep.  °Postpartum depression requires treatment because it can last several months or longer if it is not treated. Treatment may include individual or group therapy, medicine, or both to address any social, physiological, and psychological   factors that may play a role in the depression. Regular exercise, a healthy diet, rest, and social support may also be strongly recommended.  °Postpartum psychosis is more serious and needs treatment right away. Hospitalization is often needed. °HOME CARE  INSTRUCTIONS °· Get as much rest as you can. Nap when the baby sleeps.   °· Exercise regularly. Some women find yoga and walking to be beneficial.   °· Eat a balanced and nourishing diet.   °· Do little things that you enjoy. Have a cup of tea, take a bubble bath, read your favorite magazine, or listen to your favorite music. °· Avoid alcohol.   °· Ask for help with household chores, cooking, grocery shopping, or running errands as needed. Do not try to do everything.   °· Talk to people close to you about how you are feeling. Get support from your partner, family members, friends, or other new moms. °· Try to stay positive in how you think. Think about the things you are grateful for.   °· Do not spend a lot of time alone.   °· Only take over-the-counter or prescription medicine as directed by your health care provider. °· Keep all your postpartum appointments.   °· Let your health care provider know if you have any concerns.   °SEEK MEDICAL CARE IF: °You are having a reaction to or problems with your medicine. °SEEK IMMEDIATE MEDICAL CARE IF: °· You have suicidal feelings.   °· You think you may harm the baby or someone else. °MAKE SURE YOU: °· Understand these instructions. °· Will watch your condition. °· Will get help right away if you are not doing well or get worse. °  °This information is not intended to replace advice given to you by your health care provider. Make sure you discuss any questions you have with your health care provider. °  °Document Released: 03/22/2004 Document Revised: 06/23/2013 Document Reviewed: 03/30/2013 °Elsevier Interactive Patient Education ©2016 Elsevier Inc. ° °

## 2015-07-21 NOTE — Progress Notes (Signed)
FMLA for patient's spouse completed today.  Copy given to spouse.  Copy faxed to [employer] per patient and spouse request.  Copy scanned to chart.

## 2015-07-28 ENCOUNTER — Ambulatory Visit: Payer: Managed Care, Other (non HMO) | Admitting: Obstetrics & Gynecology

## 2015-08-07 ENCOUNTER — Other Ambulatory Visit: Payer: Self-pay | Admitting: Obstetrics & Gynecology

## 2015-08-29 ENCOUNTER — Ambulatory Visit (INDEPENDENT_AMBULATORY_CARE_PROVIDER_SITE_OTHER): Payer: Managed Care, Other (non HMO) | Admitting: Physician Assistant

## 2015-08-29 ENCOUNTER — Encounter: Payer: Self-pay | Admitting: Physician Assistant

## 2015-08-29 VITALS — BP 114/68 | HR 97 | Temp 97.4°F | Resp 18

## 2015-08-29 DIAGNOSIS — G43A0 Cyclical vomiting, not intractable: Secondary | ICD-10-CM | POA: Diagnosis not present

## 2015-08-29 DIAGNOSIS — K529 Noninfective gastroenteritis and colitis, unspecified: Secondary | ICD-10-CM | POA: Diagnosis not present

## 2015-08-29 DIAGNOSIS — F430 Acute stress reaction: Secondary | ICD-10-CM | POA: Diagnosis not present

## 2015-08-29 LAB — POCT URINE PREGNANCY: Preg Test, Ur: NEGATIVE

## 2015-08-29 MED ORDER — ONDANSETRON 4 MG PO TBDP
4.0000 mg | ORAL_TABLET | Freq: Once | ORAL | Status: AC
  Administered 2015-08-29: 4 mg via ORAL

## 2015-08-29 MED ORDER — ONDANSETRON HCL 4 MG PO TABS: 4.0000 mg | ORAL_TABLET | Freq: Three times a day (TID) | ORAL | Status: DC | PRN

## 2015-08-29 NOTE — Progress Notes (Signed)
08/29/2015 6:20 PM   DOB: 07/20/1990 / MRN: [id number]  SUBJECTIVE:  Mallory Craig is a 25 y.o. female presenting for nausea, emesis and diarrhea that started last night.  Reports the diarrhea has stopped.  Reports she feels thirsty but can not hold anything down.  Reports several sick contacts with similar symptoms.  Denies any antibiotic usage in the last 3-4 weeks.   She recently had unplanned abortion and the fetus was 27 weeks at the time of the abortion. She feels this was her fault.  She has a history of post partum depression.  She would like to become pregnant as soon as possible.  Denies suicidal and homicidal ideation. She feels that her kids are safe around her.  She has had counseling in the past and this has been helpful for her.  He obstetrician has recently increased her Zoloft to 200 mg.   She is allergic to nitrofurantoin.   She  has a past medical history of Anxiety; Allergy; Depression; and Asthma.    She  reports that she has never smoked. She does not have any smokeless tobacco history on file. She reports that she does not drink alcohol or use illicit drugs. She  reports that she currently engages in sexual activity. The patient  has past surgical history that includes Adenoidectomy.  Her family history includes Arthritis in her mother; Asthma in her brother; Cancer in her maternal grandfather, maternal grandmother, and mother; Hyperthyroidism in her mother.  Review of Systems  Constitutional: Negative for fever and chills.  Respiratory: Negative for cough.   Cardiovascular: Negative for chest pain.  Gastrointestinal: Positive for nausea, vomiting and diarrhea.  Genitourinary: Negative for dysuria, urgency and frequency.  Musculoskeletal: Negative for myalgias.  Skin: Negative for rash.  Neurological: Negative for dizziness and headaches.    Problem list and medications reviewed and updated by myself where necessary, and exist elsewhere in the encounter.    OBJECTIVE:  BP 114/68 mmHg  Pulse 97  Temp(Src) 97.4 F (36.3 C) (Oral)  Resp 18  SpO2 98%  Breastfeeding? No  Physical Exam  Constitutional: She is oriented to person, place, and time. She appears well-nourished. No distress.  Eyes: EOM are normal. Pupils are equal, round, and reactive to light.  Cardiovascular: Normal rate.   Pulmonary/Chest: Effort normal and breath sounds normal.  Abdominal: She exhibits no distension and no mass. There is tenderness (generalized). There is no rebound and no guarding.  Neurological: She is alert and oriented to person, place, and time. No cranial nerve deficit. Gait normal.  Skin: Skin is dry. She is not diaphoretic.  Psychiatric: She has a normal mood and affect.  Vitals reviewed.   Results for orders placed or performed in visit on 08/29/15 (from the past 72 hour(s))  POCT urine pregnancy     Status: Normal   Collection Time: 08/29/15  6:03 PM  Result Value Ref Range   Preg Test, Ur Negative Negative    No results found.  ASSESSMENT AND PLAN  Mallory Craig was seen today for emesis, diarrhea, dizziness, nausea and depression.  Diagnoses and all orders for this visit:  Cyclical vomiting with nausea, intractability of vomiting not specified -     POCT urine pregnancy -     Cancel: POCT urine pregnancy -     ondansetron (ZOFRAN-ODT) disintegrating tablet 4 mg; Take 1 tablet (4 mg total) by mouth once.  Acute gastroenteritis: She is afebrile and has a non specific belly exam. She has been given  two liters in clinic and has had a good response to zofran.    Acute stress reaction: Her SSRI dose is at it's maximum.  She feels safe and feels that her two children are safe in her presence.  She has significant guilt.  Will send her for talk therapy and will refer her to psych if there are any other options from a medication standpoint.      The patient was advised to call or return to clinic if she does not see an improvement in symptoms  or to seek the care of the closest emergency department if she worsens with the above plan.   Deliah Boston, MHS, PA-C [facility] 08/29/2015 6:20 PM

## 2015-10-13 ENCOUNTER — Ambulatory Visit: Payer: Managed Care, Other (non HMO) | Admitting: Physician Assistant

## 2015-10-19 ENCOUNTER — Telehealth: Payer: Self-pay

## 2015-10-19 NOTE — Telephone Encounter (Signed)
Msg is for Dr. Katrinka BlazingSmith, pt wants Zoloft dropped down to 150mg . Ideally she wants to get to 100mg . She would like some advice on this.  Please advise  [phone]

## 2015-10-20 ENCOUNTER — Ambulatory Visit: Payer: Self-pay | Admitting: Physician Assistant

## 2015-10-20 NOTE — Telephone Encounter (Signed)
Called to get more info. Unable to leave message. Why does she need to drop down?

## 2015-10-25 NOTE — Telephone Encounter (Signed)
Pt advised.

## 2015-10-25 NOTE — Telephone Encounter (Signed)
Call -- I have not seen patient since 01/2015; she really needs office visit to discuss everything in detail.  Please offer her to present to [facility] to see me this week or weekend to discuss in detail.

## 2015-10-25 NOTE — Telephone Encounter (Signed)
Spoke with pt, she states she thinks it is a really high dose. She needed this when she lost her son and the OBG/YN who discharged her from the hospital told her to go down because it is a very high dose to stay on. She wants to be off the medication all together. Can we start the wean down process.

## 2015-11-25 ENCOUNTER — Other Ambulatory Visit: Payer: Self-pay | Admitting: Internal Medicine

## 2015-12-02 ENCOUNTER — Telehealth: Payer: Self-pay

## 2015-12-02 MED ORDER — SERTRALINE HCL 100 MG PO TABS: 200.0000 mg | ORAL_TABLET | Freq: Every day | ORAL | Status: DC

## 2015-12-02 NOTE — Telephone Encounter (Signed)
Patient husband calling stating that his wife is in desperate need of her medication Zoloft.  He states they lost their baby boy in January 2017 when she was [redacted] weeks pregnant.  I advised the patient to bring her in to be seen. He understood.  I spoke with Deliah BostonMichael Clark and he approved for the patient to have 30 days worth of Zoloft with no refills.  The patient needed to be seen to get more refills  I called the husband back and relayed the message and he was very grateful for this.  Medication was sent to the patients pharmacy of choice.

## 2015-12-02 NOTE — Telephone Encounter (Signed)
Pt is checking on status of her zoloft refill request   Best number [phone]

## 2015-12-15 ENCOUNTER — Ambulatory Visit
Admission: RE | Admit: 2015-12-15 | Discharge: 2015-12-15 | Disposition: A | Discharge status: Home / Self Care | Payer: Managed Care, Other (non HMO) | Source: Ambulatory Visit | Attending: Obstetrics and Gynecology | Admitting: Obstetrics and Gynecology

## 2015-12-15 VITALS — BP 120/62 | HR 73 | Temp 97.6°F | Resp 18 | Wt 148.6 lb

## 2015-12-15 DIAGNOSIS — F329 Major depressive disorder, single episode, unspecified: Secondary | ICD-10-CM | POA: Diagnosis not present

## 2015-12-15 DIAGNOSIS — IMO0002 Reserved for concepts with insufficient information to code with codable children: Secondary | ICD-10-CM

## 2015-12-15 DIAGNOSIS — Z3A26 26 weeks gestation of pregnancy: Secondary | ICD-10-CM | POA: Insufficient documentation

## 2015-12-15 DIAGNOSIS — J45909 Unspecified asthma, uncomplicated: Secondary | ICD-10-CM | POA: Insufficient documentation

## 2015-12-15 DIAGNOSIS — F419 Anxiety disorder, unspecified: Secondary | ICD-10-CM | POA: Diagnosis not present

## 2015-12-15 DIAGNOSIS — O364XX0 Maternal care for intrauterine death, not applicable or unspecified: Secondary | ICD-10-CM | POA: Diagnosis not present

## 2015-12-15 NOTE — Progress Notes (Signed)
[facility] Consultation   Chief Complaint: h/o IUFD at 63 weeks 07/05/2015  HPI: Ms. Yolanda Huffstetler is a 25 y.o. [id number] at 10 weeks  by lmp 10/01/2015 who presents in consultation from [facility]  for h/o IUFD at 26 weeks delivered 07/05/2015 at [facility] - Pt notes that pregnancy was uncomplicated just like her other 2 pregnancies until 07/04/2015 when she felt less movement and went to the hospital . Delivery was fairly rapid after misoprostol placed - fetus presented as a shoulder .  Jimmy 800 gm normal placental path - no chorio . No karyotype microarray or autopsy done. He was cremated . Pt has done counseling and support group , she had a balloon release on 4 /01/2016 his EDC . She is on Zoloft down to 100 mg daily from 200 mg. She hopes to keep tapering . She feels like being pregnant is the best medicine . She is looking forward to the new baby. She feels like she is getting past trying to figure out what happened . She is healthy and was taking good care of herself .    Past Medical History: Patient  has a past medical history of Anxiety; Allergy; Depression; and Asthma.  Past Surgical History: She  has past surgical history that includes Adenoidectomy.  Obstetric History: her 2 active girls are with her today #1 "Katelyn" 2013 6 lb 6 oz 39 weeks SVD first tri bleeding no GDM or HTN #2 "Ronnie" 2015 7 lbs 7 oz 39 w SVD  #3 "Jimmy"  07/05/2015 IUFD 26 w 800 g neg placental path , neg quad screen no infections #4- current 10 w  Gynecologic History:  No LMP recorded.    Medications: vits , Zoloft 100 qd Allergies: Patient is allergic to nitrofurantoin.  Social History: Patient  reports that she has never smoked. She does not have any smokeless tobacco history on file. She reports that she does not drink alcohol or use illicit drugs.  Family History: family history includes Arthritis in her mother; Asthma in her brother; Cancer in her maternal grandfather, maternal grandmother,  and mother; Hyperthyroidism in her mother. Cystic fibrosis - she and partner were tested in first pregnancy , husband has DM  Review of Systems A full 12 point review of systems was negative or as noted in the History of Present Illness.  Physical Exam: Well appearing WF  Recent scan at [facility]  Asessement: 1. IUFD (intrauterine fetal death)   no clear etiology no gross anomalies normally grown and normal placental path - given lack of associations I quoted a low recurrence risk close to 1% back ground as opposed to 3-5% recurrence generally quoted  2 varicella non immune  3 on Zoloft for anxiety and depression  Plan: Pt planning first tri screen - I suggested a 15-19 wk MSAFP for OSB screening ( not FDA approved for screening for placental abnormalities but normal range would be reassuring) Anatomy scan - ok to be done at [facility] Growth scan monthly starting at 24 weeks  NST AFI weekly starting at 34 weeks - twice weekly at 36 weeks,  delivery at 39 weeks  We discussed how to best support her around 26 weeks mark . Pt may need a weekly limited scan for reassurance.  I offered APS labs -drawn today I offered empiric baby aspirin pt declined .   I offered to try and get microarray on placental paraffin block - pt feels since might not give guidance for this pregnancy not  necessary  CF carriage testing done  in first pregnancy  Varicella non immune - warned re exposure - needs vaccination pp - her kids have been vaccinated . I reviewed zoloft associated with neonatal irritability - taper if she can but she may need Rx given circumstances .    Total time spent with the patient was 30 minutes with greater than 50% spent in counseling and coordination of care. We appreciate this interesting consult and will be happy to be involved in the ongoing care of Ms. Victory DakinRiley in anyway her obstetricians desire.  Jimmey RalphLivingston, Jaydee Ingman, MD [facility]aternal[facility]Fetal Medicine [facility]

## 2015-12-16 LAB — LUPUS ANTICOAGULANT PANEL
DRVVT: 29.5 s (ref 0.0–47.0)
PTT LA: 37.3 s (ref 0.0–43.6)

## 2015-12-17 LAB — CARDIOLIPIN ANTIBODIES, IGG, IGM, IGA
Anticardiolipin IgG: <9 GPL U/mL (ref 0–14)
Anticardiolipin IgM: <9 MPL U/mL (ref 0–12)

## 2015-12-17 LAB — BETA-2-GLYCOPROTEIN I ABS, IGG/M/A: Beta-2 Glyco I IgG: <9 GPI IgG units (ref 0–20)

## 2016-04-05 ENCOUNTER — Encounter: Payer: Self-pay | Admitting: *Deleted

## 2016-04-05 ENCOUNTER — Observation Stay
Admission: EM | Admit: 2016-04-05 | Discharge: 2016-04-05 | Disposition: A | Discharge status: Home / Self Care | Payer: Managed Care, Other (non HMO) | Attending: Obstetrics & Gynecology | Admitting: Obstetrics & Gynecology

## 2016-04-05 DIAGNOSIS — Z3A26 26 weeks gestation of pregnancy: Secondary | ICD-10-CM | POA: Diagnosis not present

## 2016-04-05 DIAGNOSIS — M545 Low back pain: Secondary | ICD-10-CM | POA: Insufficient documentation

## 2016-04-05 DIAGNOSIS — R109 Unspecified abdominal pain: Secondary | ICD-10-CM | POA: Insufficient documentation

## 2016-04-05 DIAGNOSIS — O26892 Other specified pregnancy related conditions, second trimester: Principal | ICD-10-CM | POA: Insufficient documentation

## 2016-04-05 DIAGNOSIS — O26899 Other specified pregnancy related conditions, unspecified trimester: Secondary | ICD-10-CM

## 2016-04-05 DIAGNOSIS — Z79899 Other long term (current) drug therapy: Secondary | ICD-10-CM | POA: Insufficient documentation

## 2016-04-05 LAB — URINALYSIS COMPLETE WITH MICROSCOPIC (ARMC ONLY)
Bilirubin Urine: NEGATIVE
Glucose, UA: NEGATIVE mg/dL
Hgb urine dipstick: NEGATIVE
KETONES UR: NEGATIVE mg/dL
LEUKOCYTES UA: NEGATIVE
NITRITE: NEGATIVE
Protein, ur: NEGATIVE mg/dL
SPECIFIC GRAVITY, URINE: 1.012 (ref 1.005–1.030)
pH: 7 (ref 5.0–8.0)

## 2016-04-05 MED ORDER — ACETAMINOPHEN 325 MG PO TABS: 650.0000 mg | ORAL_TABLET | ORAL | Status: DC | PRN

## 2016-04-05 MED ORDER — ONDANSETRON HCL 4 MG/2ML IJ SOLN: 4.0000 mg | Freq: Four times a day (QID) | INTRAMUSCULAR | Status: DC | PRN

## 2016-04-05 NOTE — Discharge Instructions (Signed)
Drink plenty of fluid and get plenty of rest. Call your provider for any other concerns °

## 2016-04-05 NOTE — Discharge Summary (Signed)
Physician Discharge Summary  Patient ID: Mallory AnonJacqueline Christon MRN: [id number] DOB/AGE: January 14, 1991 25 y.o.  Admit date: 04/05/2016 Discharge date: 04/05/2016  Admission Diagnoses:  Discharge Diagnoses:  Active Problems:   Abdominal pain affecting pregnancy   Discharged Condition: good  Hospital Course: See H&P.  No s/sx PTL.  UA neg.  FLuids and rest asdvised,  Disposition: 01-Home or Self Care     Medication List    TAKE these medications   calcium carbonate 500 MG chewable tablet Commonly known as:  TUMS Chew 1 tablet (200 mg of elemental calcium total) by mouth 3 (three) times daily with meals.   CITRANATAL HARMONY 30-1-260 MG Caps Take 1 tablet by mouth daily.   ibuprofen 600 MG tablet Commonly known as:  ADVIL,MOTRIN TAKE 1 TABLET (600 MG TOTAL) BY MOUTH EVERY 6 (SIX) HOURS.   loratadine 10 MG tablet Commonly known as:  CLARITIN Take 10 mg by mouth daily as needed for allergies (allergies). Reported on 12/15/2015   ondansetron 4 MG tablet Commonly known as:  ZOFRAN Take 1 tablet (4 mg total) by mouth every 8 (eight) hours as needed for nausea or vomiting.   sertraline 100 MG tablet Commonly known as:  ZOLOFT Take 2 tablets (200 mg total) by mouth daily.        Signed: Letitia Libraobert Paul Refoel Palladino 04/05/2016, 6:18 PM

## 2016-04-05 NOTE — Discharge Summary (Signed)
Patient discharged home, discharge instructions given, patient states understanding. Patient left floor in stable condition, denies any other needs at this time. Patient to keep next scheduled OB appointment 

## 2016-04-05 NOTE — OB Triage Note (Signed)
Patient states she starting hurting around 1400 but became painful 5/10 starting 1600 constant stabbing. Pt states baby is moving well. Denies any LOF, vaginal bleeding or any other conerns

## 2016-04-05 NOTE — H&P (Signed)
Obstetrics Admission History & Physical   CC- Abdominal pain in pregnancy   HPI:  25 y.o. [id number] @ 3622w5d (07/07/2016, by Last Menstrual Period). Admitted on 04/05/2016:   Patient Active Problem List   Diagnosis Date Noted  . Abdominal pain affecting pregnancy 04/05/2016  . IUFD (intrauterine fetal death) 07/04/2015  . Family history of genetic disorder 06/09/2015  . Anxiety and depression 02/14/2015     Presents for pain today, lower abd and back, no radiationm no modifiers, no assoc sx's.  Denies n/v/d/f/c, ctxs, ROM, VB.  Decreased fetal movements..  [facility]  PMHx:  Past Medical History:  Diagnosis Date  . Allergy    Claritin  . Anxiety   . Asthma    Childhood; last Albuterol use age 25  . 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 ADENOIDECTOMY    . WISDOM TOOTH EXTRACTION     Medications:  Prescriptions Prior to Admission  Medication Sig Dispense Refill Last Dose  . calcium carbonate (TUMS) 500 MG chewable tablet Chew 1 tablet (200 mg of elemental calcium total) by mouth 3 (three) times daily with meals. (Patient not taking: Reported on 04/05/2016) 200 tablet 4 Not Taking at Unknown time  . ibuprofen (ADVIL,MOTRIN) 600 MG tablet TAKE 1 TABLET (600 MG TOTAL) BY MOUTH EVERY 6 (SIX) HOURS. (Patient not taking: Reported on 04/05/2016) 30 tablet 0 Not Taking at Unknown time  . loratadine (CLARITIN) 10 MG tablet Take 10 mg by mouth daily as needed for allergies (allergies). Reported on 12/15/2015   Not Taking at Unknown time  . ondansetron (ZOFRAN) 4 MG tablet Take 1 tablet (4 mg total) by mouth every 8 (eight) hours as needed for nausea or vomiting. (Patient not taking: Reported on 04/05/2016) 20 tablet 0 Not Taking at Unknown time  . Prenat w/o A-FeCbn-DSS-FA-DHA (CITRANATAL HARMONY) 30-1-260 MG CAPS Take 1 tablet by mouth daily. 30 capsule 12 Unknown at Unknown time  . sertraline  (ZOLOFT) 100 MG tablet Take 2 tablets (200 mg total) by mouth daily. (Patient not taking: Reported on 04/05/2016) 60 tablet 0 Not Taking at Unknown time   Allergies: is allergic to nitrofurantoin. OBHx:  OB History  Gravida Para Term Preterm AB Living  4 3 2 1   2   SAB TAB Ectopic Multiple Live Births        0 2    # Outcome Date GA Lbr Len/2nd Weight Sex Delivery Anes PTL Lv  4 Current           3 Preterm 07/05/15 3916w3d / 01:46 1 lb 13 oz (0.822 kg) M Vag-Spont EPI  FD  2 Term 09/02/13 1258w0d  7 lb 7 oz (3.374 kg) F Vag-Spont None N LIV  1 Term 01/23/12 1924w0d  6 lb 6 oz (2.892 kg) F Vag-Spont EPI N LIV     JWJ:XBJYNWGN/FAOZHYQMVHQIFHx:Negative/unremarkable except as detailed in HPI. Soc Hx: Alcohol: none, Recreational drug use: none and Denies domestic abuse  Objective:  There were no vitals filed for this visit. General: Well nourished, well developed female in no acute distress.  Skin: Warm and dry.  Cardiovascular:Regular rate and rhythm. Respiratory: Clear to auscultation bilateral. Normal respiratory effort Abdomen: gravid NT ND Neuro/Psych: Normal mood and affect.  Back- No CVAT Extr no edema  EFM: 140s Toco: None  Assessment & Plan:   25 y.o. [id number] @ 6122w5d, Admitted on 04/05/2016:Abd and Low back Pain 2nd  trimester   UA FHTs reassuring to pt as she was anxious (prior IUFD) Rest and fluids

## 2016-05-28 ENCOUNTER — Inpatient Hospital Stay
Admission: EM | Admit: 2016-05-28 | Discharge: 2016-05-28 | Disposition: A | Discharge status: Home / Self Care | Payer: Managed Care, Other (non HMO) | Attending: Certified Nurse Midwife | Admitting: Certified Nurse Midwife

## 2016-05-28 ENCOUNTER — Encounter: Payer: Self-pay | Admitting: *Deleted

## 2016-05-28 DIAGNOSIS — R109 Unspecified abdominal pain: Secondary | ICD-10-CM

## 2016-05-28 DIAGNOSIS — Z3A34 34 weeks gestation of pregnancy: Secondary | ICD-10-CM | POA: Insufficient documentation

## 2016-05-28 DIAGNOSIS — O368130 Decreased fetal movements, third trimester, not applicable or unspecified: Secondary | ICD-10-CM | POA: Diagnosis present

## 2016-05-28 DIAGNOSIS — O26899 Other specified pregnancy related conditions, unspecified trimester: Secondary | ICD-10-CM

## 2016-05-28 NOTE — Final Progress Note (Signed)
Physician Final Progress Note  Patient ID: Mallory AnonJacqueline Craig MRN: [id number] DOB/AGE: October 11, 1990 25 y.o.  Admit date: 05/28/2016 Admitting provider: Vena AustriaAndreas Staebler, MD Discharge date: 05/28/2016   Admission Diagnoses: Decreased fetal movement at 34wk2d History of previous intrauterine fetal demise  Discharge Diagnoses: IUP at 34wk2d with reactive NST  Consults: none  Significant Findings/ Diagnostic Studies: 25 year old 584P2102 with EDC=07/07/2016 who presents at 34.2 weeks with complaints of decreased fetal movement this Am after breakfast. No vaginal bleeding. Some BH contractions and back pain. Had intercourse last night. Feels damp this Am, but no gushes of fluid. [facility] remarkable for IUFD at 26 weeks earlier this year. History also remarkable for postpartum depression. Was on Zoloft and has weaned herself off this medication. Exam: BP (!) 115/58 (BP Location: Left Arm)   Pulse 88   Ht 5\' 7"  (1.702 m)   Wt 77.1 kg (170 lb)   LMP 10/01/2015   BMI 26.63 kg/m   General: WF in NAD FHR 140s with accelerations to 160s, moderate variability No contractions seen AFI=2.6cm +3.7cm+2.8cm+4.0=13.1cm Baby cephalic, breathing motion, fetal movement seen A: IUP at 34wk2d with reactive NST and normal AFI P: [facility] CAn reschedule AFI/NST from tomorrow to later this week.  Procedures:NST/ AFI  Discharge Condition: good  Disposition: 01-Home or Self Care  Diet: Regular diet  Discharge Activity: Activity as tolerated     Medication List    TAKE these medications   acetaminophen 325 MG tablet Commonly known as:  TYLENOL Take 650 mg by mouth every 6 (six) hours as needed for mild pain.   calcium carbonate 500 MG chewable tablet Commonly known as:  TUMS Chew 1 tablet (200 mg of elemental calcium total) by mouth 3 (three) times daily with meals.   CITRANATAL HARMONY 30-1-260 MG Caps Take 1 tablet by mouth daily.        Total time spent  taking care of this patient: 15 minutes  Signed: Eboney Claybrook 05/28/2016, 12:12 PM

## 2016-05-28 NOTE — OB Triage Note (Signed)
Decreased fetal movement since she awoke this am. "Just hasn't been as active." [address], [place]

## 2016-06-01 LAB — OB RESULTS CONSOLE GBS: GBS: NEGATIVE

## 2016-06-13 ENCOUNTER — Observation Stay
Admission: EM | Admit: 2016-06-13 | Discharge: 2016-06-13 | Disposition: A | Discharge status: Home / Self Care | Payer: Managed Care, Other (non HMO) | Attending: Obstetrics and Gynecology | Admitting: Obstetrics and Gynecology

## 2016-06-13 DIAGNOSIS — R109 Unspecified abdominal pain: Secondary | ICD-10-CM

## 2016-06-13 DIAGNOSIS — O4703 False labor before 37 completed weeks of gestation, third trimester: Secondary | ICD-10-CM | POA: Diagnosis present

## 2016-06-13 DIAGNOSIS — Z3A36 36 weeks gestation of pregnancy: Secondary | ICD-10-CM | POA: Insufficient documentation

## 2016-06-13 DIAGNOSIS — O479 False labor, unspecified: Secondary | ICD-10-CM | POA: Diagnosis present

## 2016-06-13 DIAGNOSIS — O26899 Other specified pregnancy related conditions, unspecified trimester: Secondary | ICD-10-CM

## 2016-06-13 MED ORDER — ACETAMINOPHEN 325 MG PO TABS: 650.0000 mg | ORAL_TABLET | ORAL | Status: DC | PRN

## 2016-06-13 NOTE — OB Triage Note (Signed)
Pt G4P2 7156w4d complains of contractions. States they started at 1515 and are 4-6 minutes apart. Pt denies LOD, but states she had some mucous when she went to the bathroom. Pt denies bleeding and states + FM.

## 2016-06-13 NOTE — Final Progress Note (Signed)
Physician Final Progress Note  Patient ID: Mallory AnonJacqueline Craig MRN: [id number] DOB/AGE: 1990-09-02 25 y.o.  Admit date: 06/13/2016 Admitting provider: Vena AustriaAndreas Mariel Lukins, MD Discharge date: 06/13/2016   Admission Diagnoses: Irregular uterine contractions  Discharge Diagnoses:  Active Problems:   Irregular uterine contractions   25 yo G4P2102 at 6522w4d presenting with irregular uterine contractions and round ligament pain.  Cervix 3.5/70/-2 and very posterior  Consults: None  Significant Findings/ Diagnostic Studies: none  Procedures: NST reactive tracing   Discharge Condition: good  Disposition: 01-Home or Self Care  Diet: Regular diet  Discharge Activity: Activity as tolerated  Discharge Instructions    Discharge activity:  No Restrictions    Complete by:  As directed    Discharge diet:  No restrictions    Complete by:  As directed    Fetal Kick Count:  Lie on our left side for one hour after a meal, and count the number of times your baby kicks.  If it is less than 5 times, get up, move around and drink some juice.  Repeat the test 30 minutes later.  If it is still less than 5 kicks in an hour, notify your doctor.    Complete by:  As directed    LABOR:  When conractions begin, you should start to time them from the beginning of one contraction to the beginning  of the next.  When contractions are 5 - 10 minutes apart or less and have been regular for at least an hour, you should call your health care provider.    Complete by:  As directed    No sexual activity restrictions    Complete by:  As directed    Notify physician for bleeding from the vagina    Complete by:  As directed    Notify physician for blurring of vision or spots before the eyes    Complete by:  As directed    Notify physician for chills or fever    Complete by:  As directed    Notify physician for fainting spells, "black outs" or loss of consciousness    Complete by:  As directed    Notify physician for  increase in vaginal discharge    Complete by:  As directed    Notify physician for leaking of fluid    Complete by:  As directed    Notify physician for pain or burning when urinating    Complete by:  As directed    Notify physician for pelvic pressure (sudden increase)    Complete by:  As directed    Notify physician for severe or continued nausea or vomiting    Complete by:  As directed    Notify physician for sudden gushing of fluid from the vagina (with or without continued leaking)    Complete by:  As directed    Notify physician for sudden, constant, or occasional abdominal pain    Complete by:  As directed    Notify physician if baby moving less than usual    Complete by:  As directed        Medication List    TAKE these medications   acetaminophen 325 MG tablet Commonly known as:  TYLENOL Take 650 mg by mouth every 6 (six) hours as needed for mild pain.   calcium carbonate 500 MG chewable tablet Commonly known as:  TUMS Chew 1 tablet (200 mg of elemental calcium total) by mouth 3 (three) times daily with meals.   CITRANATAL HARMONY 30-1-260 MG  Caps Take 1 tablet by mouth daily.        Total time spent taking care of this patient: 20 minutes  Signed: Lorrene ReidSTAEBLER, Rula Keniston M 06/13/2016, 7:33 PM

## 2016-06-13 NOTE — Discharge Summary (Signed)
See final progress note. 

## 2016-06-20 ENCOUNTER — Observation Stay
Admission: EM | Admit: 2016-06-20 | Discharge: 2016-06-20 | Disposition: A | Discharge status: Home / Self Care | Payer: Managed Care, Other (non HMO) | Source: Home / Self Care | Admitting: Obstetrics and Gynecology

## 2016-06-20 DIAGNOSIS — O26899 Other specified pregnancy related conditions, unspecified trimester: Secondary | ICD-10-CM

## 2016-06-20 DIAGNOSIS — R6889 Other general symptoms and signs: Secondary | ICD-10-CM | POA: Diagnosis present

## 2016-06-20 DIAGNOSIS — Z3A37 37 weeks gestation of pregnancy: Secondary | ICD-10-CM

## 2016-06-20 DIAGNOSIS — O99513 Diseases of the respiratory system complicating pregnancy, third trimester: Secondary | ICD-10-CM | POA: Insufficient documentation

## 2016-06-20 DIAGNOSIS — O26893 Other specified pregnancy related conditions, third trimester: Secondary | ICD-10-CM | POA: Insufficient documentation

## 2016-06-20 DIAGNOSIS — O368190 Decreased fetal movements, unspecified trimester, not applicable or unspecified: Secondary | ICD-10-CM | POA: Diagnosis present

## 2016-06-20 DIAGNOSIS — R110 Nausea: Secondary | ICD-10-CM

## 2016-06-20 DIAGNOSIS — R509 Fever, unspecified: Secondary | ICD-10-CM

## 2016-06-20 DIAGNOSIS — R109 Unspecified abdominal pain: Principal | ICD-10-CM

## 2016-06-20 LAB — URINALYSIS, COMPLETE (UACMP) WITH MICROSCOPIC
Bacteria, UA: NONE SEEN
Bilirubin Urine: NEGATIVE
GLUCOSE, UA: NEGATIVE mg/dL
HGB URINE DIPSTICK: NEGATIVE
KETONES UR: NEGATIVE mg/dL
NITRITE: NEGATIVE
Protein, ur: NEGATIVE mg/dL
Specific Gravity, Urine: 1.009 (ref 1.005–1.030)
pH: 8 (ref 5.0–8.0)

## 2016-06-20 LAB — CBC
HEMATOCRIT: 35.8 % (ref 35.0–47.0)
HEMOGLOBIN: 12.2 g/dL (ref 12.0–16.0)
MCH: 29.6 pg (ref 26.0–34.0)
MCHC: 33.9 g/dL (ref 32.0–36.0)
MCV: 87.3 fL (ref 80.0–100.0)
Platelets: 138 10*3/uL — ABNORMAL LOW (ref 150–440)
RBC: 4.1 MIL/uL (ref 3.80–5.20)
RDW: 13.2 % (ref 11.5–14.5)
WBC: 12.9 10*3/uL — ABNORMAL HIGH (ref 3.6–11.0)

## 2016-06-20 LAB — INFLUENZA PANEL BY PCR (TYPE A & B)
Influenza A By PCR: POSITIVE — AB
Influenza B By PCR: NEGATIVE

## 2016-06-20 MED ORDER — BUTORPHANOL TARTRATE 1 MG/ML IJ SOLN
1.0000 mg | Freq: Once | INTRAMUSCULAR | Status: AC
Start: 2016-06-20 — End: 2016-06-20
  Administered 2016-06-20: 1 mg via INTRAVENOUS
  Filled 2016-06-20: qty 1

## 2016-06-20 MED ORDER — OSELTAMIVIR PHOSPHATE 75 MG PO CAPS: 75.0000 mg | ORAL_CAPSULE | Freq: Two times a day (BID) | ORAL | 0 refills | Status: DC

## 2016-06-20 MED ORDER — ONDANSETRON HCL 4 MG/2ML IJ SOLN
4.0000 mg | INTRAMUSCULAR | Status: DC | PRN
  Administered 2016-06-20: 4 mg via INTRAVENOUS
  Filled 2016-06-20: qty 2

## 2016-06-20 MED ORDER — ACETAMINOPHEN 500 MG PO TABS
1000.0000 mg | ORAL_TABLET | Freq: Four times a day (QID) | ORAL | Status: DC | PRN
  Administered 2016-06-20: 1000 mg via ORAL
  Filled 2016-06-20: qty 2

## 2016-06-20 MED ORDER — LACTATED RINGERS IV SOLN: INTRAVENOUS | Status: DC

## 2016-06-20 MED ORDER — LACTATED RINGERS IV SOLN
500.0000 mL | INTRAVENOUS | Status: DC | PRN
  Administered 2016-06-20: 500 mL via INTRAVENOUS

## 2016-06-20 MED ORDER — OSELTAMIVIR PHOSPHATE 75 MG PO CAPS
75.0000 mg | ORAL_CAPSULE | Freq: Two times a day (BID) | ORAL | Status: DC
  Administered 2016-06-20: 75 mg via ORAL
  Filled 2016-06-20 (×2): qty 1

## 2016-06-20 NOTE — Discharge Summary (Signed)
Physician Discharge Summary  Patient ID: Mallory Craig MRN: [id number] DOB/AGE: 23-29-1992 23 y.o.  Admit date: 06/20/2016 Discharge date: 06/20/2016  Admission Diagnoses: Pt presented at 37 weeks but with fever aches and nausea.  Known to have advanced cervical dilation.  Occas contractions.  Discharge Diagnoses:  Active Problems:   Decreased fetal movement   Flu-like symptoms   Discharged Condition: good  Hospital Course: Seen and examined.  Flu test POS.  Treated with IVF, Tylenol, Tamiflu, Zofran for nausea.  No signs of labor progression.  Prefer to recover from flu before active management of labor.  Consults: None  Significant Diagnostic Studies: labs: Flu +  Treatments: IV hydration and Tamiflu  Discharge Exam: Blood pressure 100/66, pulse (!) 130, temperature 98.7 F (37.1 C), temperature source Oral, resp. rate 20, last menstrual period 10/01/2015. General appearance: alert, cooperative and no distress GI: soft, non-tender; bowel sounds normal; no masses,  no organomegaly Pelvic: cervix normal in appearance, external genitalia normal, no adnexal masses or tenderness, no cervical motion tenderness, rectovaginal septum normal, uterus normal size, shape, and consistency, vagina normal without discharge and CERVIX 3-4/79/-3 and POSTERIOR Extremities: extremities normal, atraumatic, no cyanosis or edema  Disposition: 01-Home or Self Care   Allergies as of 06/20/2016   No Known Allergies     Medication List    TAKE these medications   acetaminophen 325 MG tablet Commonly known as:  TYLENOL Take 650 mg by mouth every 6 (six) hours as needed for mild pain.   calcium carbonate 500 MG chewable tablet Commonly known as:  TUMS Chew 1 tablet (200 mg of elemental calcium total) by mouth 3 (three) times daily with meals.   CITRANATAL HARMONY 30-1-260 MG Caps Take 1 tablet by mouth daily.   oseltamivir 75 MG capsule Commonly known as:  TAMIFLU Take 1 capsule (75  mg total) by mouth 2 (two) times daily.      Follow-up Information    Letitia Libraobert Paul Little [place], MD. Go in 2 day(s).   Specialty:  Obstetrics and Gynecology Why:  as scheduled Contact information: [address] [place] [address] [phone]           Signed: Letitia LibraRobert Paul Mallory Craig 06/20/2016, 10:52 AM

## 2016-06-20 NOTE — Progress Notes (Signed)
Discharge instructions given and explained.  Verbalized understanding.  Prescription given with education on how to take meds.  Questions answered.

## 2016-06-20 NOTE — H&P (Signed)
OB History & Physical   History of Present Illness:  Chief Complaint:  Complains of body aches, contractions and temperature to 104 this morning around midnight. Also complains of dysuria. HPI:  Fletcher AnonJacqueline Haglund is a 25 y.o. [phone] [facility]72[facility] dated by her LMP.  Her pregnancy has been complicated by history of an IUFD at 3326 weeks in JAn 2017 followed by pp depression. . She also had pp depression after the birth of her first child.  She presents to L&D for evaluation of above complaints. Was seen in office yesterday with body aches, sore throat and temperature to 101. Temperature spiked to 104 around midnight and she took Tylenol 1000 mgm x 1 which brought her temperature down. She started contracting during the night and they intensified at about 0330 this AM. Has not had a flu vaccine this year. Cervix has been 3-4 cm dilated in the office. Noticed some mucoid discharge yesterday.   Prenatal care site: [facility] has also been remarkable for history of two term vaginal deliveries in 2013 and 2015. Because of her history of fetal demise she has had growth scans and recently started antepartum testing. NST was reactive yesterday and AFI was 11 cm.Growth scan last week with EFW in 46th% (6#8oz)      Maternal Medical History:   Past Medical History:  Diagnosis Date  . Allergy    Claritin  . Anxiety   . Asthma    Childhood; last Albuterol use age 25  . 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 ADENOIDECTOMY    . WISDOM TOOTH EXTRACTION      No Known Allergies  Prior to Admission medications   Medication Sig Start Date End Date Taking? Authorizing Provider  acetaminophen (TYLENOL) 325 MG tablet Take 650 mg by mouth every 6 (six) hours as needed for mild pain.   Yes Historical Provider, MD  calcium carbonate (TUMS) 500 MG chewable tablet Chew 1 tablet (200 mg of elemental calcium  total) by mouth 3 (three) times daily with meals. 06/16/15  Yes Rachelle A Denney, CNM  Prenat w/o A-FeCbn-DSS-FA-DHA (CITRANATAL HARMONY) 30-1-260 MG CAPS Take 1 tablet by mouth daily. 06/01/15  Yes Rachelle Sindy MessingA Denney, CNM          Social History: She  reports that she has never smoked. She has never used smokeless tobacco. She reports that she does not drink alcohol or use drugs.  Family History: family history includes Arthritis in her mother; Asthma in her brother; Cancer in her maternal grandfather, maternal grandmother, and mother; Hyperthyroidism in her mother.   Review of Systems: Negative x 10 systems reviewed except as noted in the HPI.      Physical Exam:  Vital Signs: BP 100/66   Pulse (!) 130   Temp (!) 100.6 F (38.1 C) (Axillary)   Resp 20   LMP 10/01/2015  General: checks red with fever, very tense and anxious, hurts all over HEENT: Throat culture done-would not open mouth far enough to visualize PO, no cervical lymphadenopathy Heart: regular  Rhythm but tachycardic.  No murmurs Lungs: clear to auscultation bilaterally Abdomen:  gravid, generalized tenderness, contractions palpable  EFW: 7# Pelvic:   External: Normal external female genitalia  Cervix: 4/80%/-1 to 0/posterior, bloody mucoid show on glove  Extremities: skin tender to touch, symmetric, +1 edema bilaterally.  DTRs: +2  Neurologic: Alert & oriented x 3.  Pertinent Results:  Prenatal Labs: Blood type/Rh B positive  Antibody screen negative  Rubella Varicella Immune Non immune  RPR Non reactive  HBsAg Negative  HIV negative  GC negative  Chlamydia negative  Genetic screening Negative First trimester test  1 hour GTT 69  3 hour GTT NA  GBS negative on 12/7   Baseline FHR: 150s with accelerations to 170s to 180, moderate variability Toco: contractions q5-7 min apart  Assessment:  Fletcher AnonJacqueline Dion is a 25 y.o. [phone] female at 2664w4d with flu like symptoms Contractions: R/O labor (not  much change since earlier exams)  Plan:  1. Admit to Labor & Delivery for observation  2. CBC, T&S, Clrs, IVF 3. Rapid influenza test, Throat culture 4. IV Zofran, Tylenol for fever 5. Tamiflu 75 mgm BID x 5 days 6. Recheck cervix if contractions persist after starting above measures.   Farrel ConnersGUTIERREZ, Neema Fluegge  06/20/2016 6:46 AM

## 2016-06-20 NOTE — Progress Notes (Signed)
Discharged per wheelchair with family at side.

## 2016-06-20 NOTE — Discharge Instructions (Signed)

## 2016-06-20 NOTE — OB Triage Note (Signed)
Pt presents to L&D after calling unit and complaining of temp of 104.3, and advised to come in. Pt in apparent distress, cheeks are flushed and she is shaking. Reports feeling dizzy upon return from the BR. Pt reports having cold/flu like symptoms since yesterday but also in discomfort since yesterday, stating she hurts all over. Pt denies bleeding or leaking of fluid and reports good fetal movement. EFM applied and explained. Plan to monitor fetal and maternal well being and assess for labor. Pt took 1000mg  tylenol at 0000. Temp was 98.8 upon arrival.

## 2016-06-20 NOTE — Progress Notes (Signed)
At bedside per patient request.  States that she isn't bleeding as much and prefers to go home to rest.  Sitting up on side of bed. Adjusted EFM to auscultate heart tones.

## 2016-06-21 LAB — RPR: RPR: NONREACTIVE

## 2016-06-22 ENCOUNTER — Inpatient Hospital Stay: Payer: Managed Care, Other (non HMO) | Admitting: Anesthesiology

## 2016-06-22 ENCOUNTER — Inpatient Hospital Stay
Admission: EM | Admit: 2016-06-22 | Discharge: 2016-06-24 | DRG: 775 | Disposition: A | Discharge status: Home / Self Care | Payer: Managed Care, Other (non HMO) | Attending: Obstetrics and Gynecology | Admitting: Obstetrics and Gynecology

## 2016-06-22 DIAGNOSIS — J101 Influenza due to other identified influenza virus with other respiratory manifestations: Secondary | ICD-10-CM | POA: Diagnosis present

## 2016-06-22 DIAGNOSIS — O9952 Diseases of the respiratory system complicating childbirth: Secondary | ICD-10-CM | POA: Diagnosis present

## 2016-06-22 DIAGNOSIS — Z3A37 37 weeks gestation of pregnancy: Secondary | ICD-10-CM

## 2016-06-22 DIAGNOSIS — Z3493 Encounter for supervision of normal pregnancy, unspecified, third trimester: Secondary | ICD-10-CM | POA: Diagnosis present

## 2016-06-22 LAB — CBC
HCT: 38.5 % (ref 35.0–47.0)
HEMOGLOBIN: 13 g/dL (ref 12.0–16.0)
MCH: 29.4 pg (ref 26.0–34.0)
MCHC: 33.8 g/dL (ref 32.0–36.0)
MCV: 87.1 fL (ref 80.0–100.0)
Platelets: 175 10*3/uL (ref 150–440)
RBC: 4.42 MIL/uL (ref 3.80–5.20)
RDW: 13.6 % (ref 11.5–14.5)
WBC: 14.7 10*3/uL — AB (ref 3.6–11.0)

## 2016-06-22 LAB — TYPE AND SCREEN
ABO/RH(D): B POS
ANTIBODY SCREEN: NEGATIVE

## 2016-06-22 LAB — CULTURE, GROUP A STREP (THRC)

## 2016-06-22 MED ORDER — OSELTAMIVIR PHOSPHATE 75 MG PO CAPS
75.0000 mg | ORAL_CAPSULE | Freq: Two times a day (BID) | ORAL | Status: DC
Start: 2016-06-22 — End: 2016-06-23
  Filled 2016-06-22: qty 1

## 2016-06-22 MED ORDER — LIDOCAINE HCL (PF) 1 % IJ SOLN: 30.0000 mL | INTRAMUSCULAR | Status: DC | PRN

## 2016-06-22 MED ORDER — OXYTOCIN 40 UNITS IN LACTATED RINGERS INFUSION - SIMPLE MED
INTRAVENOUS | Status: AC
  Filled 2016-06-22: qty 1000

## 2016-06-22 MED ORDER — SOD CITRATE-CITRIC ACID 500-334 MG/5ML PO SOLN: 30.0000 mL | ORAL | Status: DC | PRN

## 2016-06-22 MED ORDER — OXYTOCIN 10 UNIT/ML IJ SOLN
INTRAMUSCULAR | Status: AC
  Filled 2016-06-22: qty 2

## 2016-06-22 MED ORDER — AMMONIA AROMATIC IN INHA
RESPIRATORY_TRACT | Status: AC
  Filled 2016-06-22: qty 10

## 2016-06-22 MED ORDER — LIDOCAINE HCL (PF) 1 % IJ SOLN
INTRAMUSCULAR | Status: AC
  Filled 2016-06-22: qty 30

## 2016-06-22 MED ORDER — FENTANYL 2.5 MCG/ML W/ROPIVACAINE 0.2% IN NS 100 ML EPIDURAL INFUSION (ARMC-ANES)
EPIDURAL | Status: AC
  Filled 2016-06-22: qty 100

## 2016-06-22 MED ORDER — FENTANYL 2.5 MCG/ML W/ROPIVACAINE 0.2% IN NS 100 ML EPIDURAL INFUSION (ARMC-ANES)
EPIDURAL | Status: DC | PRN
  Administered 2016-06-22: 10 mL/h via EPIDURAL

## 2016-06-22 MED ORDER — MISOPROSTOL 200 MCG PO TABS
ORAL_TABLET | ORAL | Status: AC
  Filled 2016-06-22: qty 4

## 2016-06-22 MED ORDER — LACTATED RINGERS IV SOLN
INTRAVENOUS | Status: DC
  Administered 2016-06-22: 17:00:00 via INTRAVENOUS

## 2016-06-22 MED ORDER — ACETAMINOPHEN 325 MG PO TABS: 650.0000 mg | ORAL_TABLET | ORAL | Status: DC | PRN

## 2016-06-22 MED ORDER — BUPIVACAINE HCL (PF) 0.25 % IJ SOLN
INTRAMUSCULAR | Status: DC | PRN
  Administered 2016-06-22 (×2): 5 mL via EPIDURAL

## 2016-06-22 MED ORDER — OXYTOCIN BOLUS FROM INFUSION: 500.0000 mL | Freq: Once | INTRAVENOUS | Status: DC

## 2016-06-22 MED ORDER — OXYTOCIN 40 UNITS IN LACTATED RINGERS INFUSION - SIMPLE MED: 2.5000 [IU]/h | INTRAVENOUS | Status: DC

## 2016-06-22 MED ORDER — ONDANSETRON HCL 4 MG/2ML IJ SOLN
4.0000 mg | Freq: Four times a day (QID) | INTRAMUSCULAR | Status: DC | PRN
  Filled 2016-06-22: qty 2

## 2016-06-22 MED ORDER — LIDOCAINE HCL (PF) 1 % IJ SOLN
INTRAMUSCULAR | Status: DC | PRN
  Administered 2016-06-22: 3 mL

## 2016-06-22 MED ORDER — LIDOCAINE-EPINEPHRINE (PF) 1.5 %-1:200000 IJ SOLN
INTRAMUSCULAR | Status: DC | PRN
  Administered 2016-06-22: 3 mL via PERINEURAL

## 2016-06-22 MED ORDER — LACTATED RINGERS IV SOLN
500.0000 mL | INTRAVENOUS | Status: DC | PRN
  Administered 2016-06-22: 500 mL via INTRAVENOUS

## 2016-06-22 NOTE — Anesthesia Procedure Notes (Signed)
Epidural Patient location during procedure: OB Start time: 06/22/2016 6:20 PM End time: 06/22/2016 6:37 PM  Staffing Resident/CRNA: Junious SilkNOLES, Lamerle Jabs Performed: resident/CRNA   Preanesthetic Checklist Completed: patient identified, site marked, surgical consent, pre-op evaluation, timeout performed, IV checked, risks and benefits discussed and monitors and equipment checked  Epidural Patient position: sitting Prep: Betadine Patient monitoring: heart rate, continuous pulse ox and blood pressure Approach: midline Location: L3-L4 Injection technique: LOR saline  Needle:  Needle type: Tuohy  Needle gauge: 18 G Needle length: 9 cm and 9 Catheter type: closed end flexible Catheter size: 20 Guage Test dose: negative and 1.5% lidocaine with Epi 1:200 K  Assessment Events: blood not aspirated, injection not painful, no injection resistance, negative IV test and no paresthesia  Additional Notes   Patient tolerated the insertion well without complications.Reason for block:procedure for pain

## 2016-06-22 NOTE — Plan of Care (Signed)
Pt arrives  To labor and delivery screaming. Checked pt. nitrazine positive. Pt found to be 9cm dilated. 0 station . Dr Bonney Aidstaebler notified . Pt demanding epidural, stating her water can't be broke. Stating she can't be 9cm dilated. Requesting nitrous. Nitrous given to pt with instructions.

## 2016-06-22 NOTE — H&P (Signed)
Subjective:  Painfully contracting  Objective:   Vitals: Last menstrual period 10/01/2015. General:  Abdomen: Cervical Exam:  Dilation: 6 Effacement (%): 90 Station: -1 Presentation: Vertex Exam by:: Bonney AidStaebler, MD  rechecked fetus at low at 0 station, cervix posterior 6cm  FHT: 130, moderate, +accels, no decels Toco: q3-665min  Results for orders placed or performed during the hospital encounter of 06/22/16 (from the past 24 hour(s))  CBC     Status: Abnormal   Collection Time: 06/22/16  4:35 PM  Result Value Ref Range   WBC 14.7 (H) 3.6 - 11.0 K/uL   RBC 4.42 3.80 - 5.20 MIL/uL   Hemoglobin 13.0 12.0 - 16.0 g/dL   HCT 04.538.5 40.935.0 - 81.147.0 %   MCV 87.1 80.0 - 100.0 fL   MCH 29.4 26.0 - 34.0 pg   MCHC 33.8 32.0 - 36.0 g/dL   RDW 91.413.6 78.211.5 - 95.614.5 %   Platelets 175 150 - 440 K/uL  Type and screen [facility]     Status: None   Collection Time: 06/22/16  4:43 PM  Result Value Ref Range   ABO/RH(D) B POS    Antibody Screen NEG    Sample Expiration 06/25/2016     Assessment:   25 y.o. [id number] 5571w6d term labor  Plan:   1) Labor - expectant management, I was able to get a better check and noted cervix to be 6cm  2) Fetus - category I tracing

## 2016-06-22 NOTE — Anesthesia Preprocedure Evaluation (Addendum)
Anesthesia Evaluation  Patient identified by MRN, date of birth, ID band Patient awake    Reviewed: Allergy & Precautions, H&P , NPO status , Patient's Chart, lab work & pertinent test results, reviewed documented beta blocker date and time   Airway Mallampati: II  TM Distance: >3 FB Neck ROM: full    Dental no notable dental hx. (+) Teeth Intact   Pulmonary neg pulmonary ROS, Current Smoker,    Pulmonary exam normal breath sounds clear to auscultation       Cardiovascular Exercise Tolerance: Good negative cardio ROS   Rhythm:regular Rate:Normal     Neuro/Psych PSYCHIATRIC DISORDERS negative neurological ROS  negative psych ROS   GI/Hepatic negative GI ROS, Neg liver ROS,   Endo/Other  negative endocrine ROSdiabetes  Renal/GU      Musculoskeletal   Abdominal   Peds  Hematology negative hematology ROS (+)   Anesthesia Other Findings   Reproductive/Obstetrics (+) Pregnancy                             Anesthesia Physical Anesthesia Plan  ASA: II  Anesthesia Plan: Epidural   Post-op Pain Management:    Induction:   Airway Management Planned:   Additional Equipment:   Intra-op Plan:   Post-operative Plan:   Informed Consent: I have reviewed the patients History and Physical, chart, labs and discussed the procedure including the risks, benefits and alternatives for the proposed anesthesia with the patient or authorized representative who has indicated his/her understanding and acceptance.     Plan Discussed with: CRNA and Anesthesiologist  Anesthesia Plan Comments:        Anesthesia Quick Evaluation

## 2016-06-22 NOTE — H&P (Signed)
Obstetric H&P   Chief Complaint: Contractions   Prenatal Care Provider: [facility]  History of Present Illness: 25 y.o. [id number] 7582w6d by LMP=10 weeks US derived EDC of 07/07/2016, presenting to L&D with contractions and advanced cervical dilation to 9cm.  She presented to the office earlier today for LOF with negative work up and no evidence of ROM.  +FM, still having some leaking but states urinated on herself, no VB.     PNC - Influenza A positive on 06/20/2016 continue tamiflu (started 12/20 continue tamiflu), history of prior 26 week still birth.  Growth scan 06/14/2016 appropriate growth 6lbs 8oz or 2946g c/w 46%ile.    B pos / ABSC neg / RI / VZI / HBsAg neg / RPR NR / 1-hr 69 / GBS negative   Received TDAP 05/16/2016  Review of Systems: 10 point review of systems negative unless otherwise noted in HPI  Past Medical History: Past Medical History:  Diagnosis Date  . Allergy    Claritin  . Anxiety   . Asthma    Childhood; last Albuterol use age 25  . 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 ADENOIDECTOMY    . WISDOM TOOTH EXTRACTION      Family History: Family History  Problem Relation Age of Onset  . Cancer Mother     skin cancer  . Arthritis Mother   . Hyperthyroidism Mother   . Cancer Maternal Grandmother   . Cancer Maternal Grandfather   . Asthma Brother     Social History: Social History   Social History  . Marital status: Single    Spouse name: N/A  . Number of children: N/A  . Years of education: N/A   Occupational History  . Not on file.   Social History Main Topics  . Smoking status: Never Smoker  . Smokeless tobacco: Never Used  . Alcohol use No  . Drug use: No  . Sexual activity: Yes    Birth control/ protection: IUD   Other Topics Concern  . Not on file   Social History Narrative   Marital status: married x 4 years.  Happily married; no abuse    Children:  2 children (2, 1)      Lives: with husband, 2 children.      Employment:  Homemaker      Tobacco: none      Alcohol: 2 drinks per week      Drugs:  None      Exercise:  No formal exercise.      Medications: Prior to Admission medications   Medication Sig Start Date End Date Taking? Authorizing Provider  acetaminophen (TYLENOL) 325 MG tablet Take 650 mg by mouth every 6 (six) hours as needed for mild pain.    Historical Provider, MD  calcium carbonate (TUMS) 500 MG chewable tablet Chew 1 tablet (200 mg of elemental calcium total) by mouth 3 (three) times daily with meals. 06/16/15   Rachelle A Denney, CNM  oseltamivir (TAMIFLU) 75 MG capsule Take 1 capsule (75 mg total) by mouth 2 (two) times daily. 06/20/16   Nadara Mustardobert P Harris, MD  Prenat w/o A-FeCbn-DSS-FA-DHA (CITRANATAL HARMONY) 30-1-260 MG CAPS Take 1 tablet by mouth daily. 06/01/15   Roe Coombsachelle A Denney, CNM    Allergies: No Known Allergies  Physical Exam: Vitals: Last menstrual period 10/01/2015.  Urine Dip Protein: N/A  FHT: 140, moderate, +accels, no decels Toco: q143min  General: NAD HEENT: normocephalic, anicteric Pulmonary: no increased work of breathing Cardiovascular: RRR Abdomen: Gravid, non-tender Leopolds: vtx, 7lbs Genitourinary: 9cm per RN check Extremities: no edema  Labs: No results found for this or any previous visit (from the past 24 hour(s)).  Assessment: 25 y.o. [id number] 7432w6d by 07/07/2016, presenting in term labor  Plan: 1) Labor - expectant management  2) Fetus - category I tracing  3) PNL - B pos / ABSC neg / RI / VZI / HBsAg neg / RPR NR / 1-hr 69 / GBS negative   4) TDAP - UTD  5) Influenza - continue tamiflu, droplet precuations  6) Disposition - pending delivery

## 2016-06-22 NOTE — Discharge Summary (Signed)
Obstetric Discharge Summary Reason for Admission: onset of labor Prenatal Procedures: none Intrapartum Procedures: spontaneous vaginal delivery Postpartum Procedures: none Complications-Operative and Postpartum: none Hemoglobin  Date Value Ref Range Status  06/23/2016 11.8 (L) 12.0 - 16.0 g/dL Final   HGB  Date Value Ref Range Status  09/02/2013 13.4 12.0 - 16.0 g/dL Final   HCT  Date Value Ref Range Status  06/23/2016 33.5 (L) 35.0 - 47.0 % Final  09/03/2013 35.6 35.0 - 47.0 % Final    Physical Exam:  General: alert, appears stated age and no distress Lochia: appropriate Uterine Fundus: firm DVT Evaluation: No evidence of DVT seen on physical exam.  Discharge Diagnoses: Term Pregnancy-delivered  Discharge Information: Date: 06/24/2016 Activity: pelvic rest Diet: routine  Allergies as of 06/24/2016   No Known Allergies     Medication List    STOP taking these medications   acetaminophen 325 MG tablet Commonly known as:  TYLENOL   oseltamivir 75 MG capsule Commonly known as:  TAMIFLU     TAKE these medications   calcium carbonate 500 MG chewable tablet Commonly known as:  TUMS Chew 1 tablet (200 mg of elemental calcium total) by mouth 3 (three) times daily with meals.   CITRANATAL HARMONY 30-1-260 MG Caps Take 1 tablet by mouth daily.   ibuprofen 600 MG tablet Commonly known as:  ADVIL,MOTRIN Take 1 tablet (600 mg total) by mouth every 6 (six) hours as needed for cramping.      Condition: stable Discharge to: home Follow-up Information    Lorrene ReidSTAEBLER, Hartman Minahan M, MD Follow up in 2 week(s).   Specialty:  Obstetrics and Gynecology Why:  postpartum depression check Contact information: [address] [place] [address] [phone]           Newborn Data: Live born unspecified sex  Birth Weight:   APGAR: ,   Home with mother.  Jahvon Gosline M 06/24/2016, 9:18 AM

## 2016-06-23 LAB — CBC
HCT: 33.5 % — ABNORMAL LOW (ref 35.0–47.0)
Hemoglobin: 11.8 g/dL — ABNORMAL LOW (ref 12.0–16.0)
MCH: 30.4 pg (ref 26.0–34.0)
MCHC: 35.2 g/dL (ref 32.0–36.0)
MCV: 86.4 fL (ref 80.0–100.0)
PLATELETS: 163 10*3/uL (ref 150–440)
RBC: 3.88 MIL/uL (ref 3.80–5.20)
RDW: 13.8 % (ref 11.5–14.5)
WBC: 18.6 10*3/uL — AB (ref 3.6–11.0)

## 2016-06-23 LAB — RPR: RPR Ser Ql: NONREACTIVE

## 2016-06-23 MED ORDER — ONDANSETRON HCL 4 MG PO TABS: 4.0000 mg | ORAL_TABLET | ORAL | Status: DC | PRN

## 2016-06-23 MED ORDER — OXYCODONE-ACETAMINOPHEN 5-325 MG PO TABS: 2.0000 | ORAL_TABLET | ORAL | Status: DC | PRN

## 2016-06-23 MED ORDER — WITCH HAZEL-GLYCERIN EX PADS: 1.0000 "application " | MEDICATED_PAD | CUTANEOUS | Status: DC | PRN

## 2016-06-23 MED ORDER — OXYCODONE-ACETAMINOPHEN 5-325 MG PO TABS: 1.0000 | ORAL_TABLET | ORAL | Status: DC | PRN

## 2016-06-23 MED ORDER — FENTANYL 2.5 MCG/ML W/ROPIVACAINE 0.2% IN NS 100 ML EPIDURAL INFUSION (ARMC-ANES): 10.0000 mL/h | EPIDURAL | Status: AC

## 2016-06-23 MED ORDER — PHENYLEPHRINE 40 MCG/ML (10ML) SYRINGE FOR IV PUSH (FOR BLOOD PRESSURE SUPPORT)
80.0000 ug | PREFILLED_SYRINGE | INTRAVENOUS | Status: DC | PRN
  Filled 2016-06-23: qty 5

## 2016-06-23 MED ORDER — ONDANSETRON HCL 4 MG/2ML IJ SOLN: 4.0000 mg | INTRAMUSCULAR | Status: DC | PRN

## 2016-06-23 MED ORDER — OSELTAMIVIR PHOSPHATE 75 MG PO CAPS
75.0000 mg | ORAL_CAPSULE | Freq: Two times a day (BID) | ORAL | Status: DC
  Administered 2016-06-23 – 2016-06-24 (×4): 75 mg via ORAL
  Filled 2016-06-23 (×5): qty 1

## 2016-06-23 MED ORDER — SIMETHICONE 80 MG PO CHEW: 80.0000 mg | CHEWABLE_TABLET | ORAL | Status: DC | PRN

## 2016-06-23 MED ORDER — BENZOCAINE-MENTHOL 20-0.5 % EX AERO: 1.0000 "application " | INHALATION_SPRAY | CUTANEOUS | Status: DC | PRN

## 2016-06-23 MED ORDER — EPHEDRINE 5 MG/ML INJ
10.0000 mg | INTRAVENOUS | Status: DC | PRN
  Filled 2016-06-23: qty 2

## 2016-06-23 MED ORDER — PRENATAL MULTIVITAMIN CH
1.0000 | ORAL_TABLET | Freq: Every day | ORAL | Status: DC
  Administered 2016-06-23 – 2016-06-24 (×2): 1 via ORAL
  Filled 2016-06-23 (×2): qty 1

## 2016-06-23 MED ORDER — ACETAMINOPHEN 325 MG PO TABS: 650.0000 mg | ORAL_TABLET | ORAL | Status: DC | PRN

## 2016-06-23 MED ORDER — DIPHENHYDRAMINE HCL 50 MG/ML IJ SOLN: 12.5000 mg | INTRAMUSCULAR | Status: DC | PRN

## 2016-06-23 MED ORDER — DIBUCAINE 1 % RE OINT: 1.0000 "application " | TOPICAL_OINTMENT | RECTAL | Status: DC | PRN

## 2016-06-23 MED ORDER — DIPHENHYDRAMINE HCL 25 MG PO CAPS: 25.0000 mg | ORAL_CAPSULE | Freq: Four times a day (QID) | ORAL | Status: DC | PRN

## 2016-06-23 MED ORDER — IBUPROFEN 600 MG PO TABS
600.0000 mg | ORAL_TABLET | Freq: Four times a day (QID) | ORAL | Status: DC
  Administered 2016-06-23 – 2016-06-24 (×7): 600 mg via ORAL
  Filled 2016-06-23 (×7): qty 1

## 2016-06-23 MED ORDER — SENNOSIDES-DOCUSATE SODIUM 8.6-50 MG PO TABS
2.0000 | ORAL_TABLET | ORAL | Status: DC
  Administered 2016-06-23: 2 via ORAL
  Filled 2016-06-23 (×2): qty 2

## 2016-06-23 MED ORDER — LACTATED RINGERS IV SOLN: 500.0000 mL | Freq: Once | INTRAVENOUS | Status: DC

## 2016-06-23 MED ORDER — COCONUT OIL OIL: 1.0000 "application " | TOPICAL_OIL | Status: DC | PRN

## 2016-06-23 NOTE — Progress Notes (Signed)
  Postpartum Day 1  Subjective: no complaints, up ad lib and voiding  Objective: Blood pressure 105/63, pulse (!) 52, temperature 97.6 F (36.4 C), resp. rate 18, height 5\' 7"  (1.702 m), weight 170 lb (77.1 kg), last menstrual period 10/01/2015, SpO2 97 %, unknown if currently breastfeeding.  Physical Exam:  General: alert and cooperative Lochia: appropriate Uterine Fundus: firm Incision: N/A DVT Evaluation: No evidence of DVT seen on physical exam. Abdomen: soft, NT   Recent Labs  06/22/16 1635 06/23/16 0536  HGB 13.0 11.8*  HCT 38.5 33.5*    Assessment PPD #1  Plan: Discharge tomorrow, Continue PP care and Advance activity as tolerated  Feeding: breast Contraception: condoms for now Blood Type: B+ RI/VI TDAP UTD  Marta AntuBrothers, Artist Bloom, [place] 06/23/2016, 11:30 AM

## 2016-06-24 MED ORDER — IBUPROFEN 600 MG PO TABS: 600.0000 mg | ORAL_TABLET | Freq: Four times a day (QID) | ORAL | 0 refills | Status: DC | PRN

## 2016-06-24 NOTE — Progress Notes (Signed)
Discharge instructions complete and prescriptions given. Patient verbalizes understanding of teaching. Patient discharged home at 1415. 

## 2016-06-26 NOTE — Anesthesia Postprocedure Evaluation (Signed)
Anesthesia Post Note  Patient: Mallory Craig  Procedure(s) Performed: * No procedures listed *  Patient location during evaluation: Mother Baby Anesthesia Type: Epidural Level of consciousness: awake and alert Pain management: pain level controlled Vital Signs Assessment: post-procedure vital signs reviewed and stable Respiratory status: spontaneous breathing, nonlabored ventilation and respiratory function stable Cardiovascular status: stable Postop Assessment: no headache, no backache and epidural receding Anesthetic complications: no Comments: Pt discharged pre eval without reported difficulty per nursing staff.  JA     Last Vitals: There were no vitals filed for this visit.  Last Pain: There were no vitals filed for this visit.               Yevette EdwardsJames G Adams

## 2016-10-16 ENCOUNTER — Ambulatory Visit (INDEPENDENT_AMBULATORY_CARE_PROVIDER_SITE_OTHER): Payer: Commercial Managed Care - PPO | Admitting: Family Medicine

## 2016-10-16 VITALS — BP 115/80 | HR 93 | Temp 97.5°F | Resp 18 | Ht 67.0 in | Wt 150.0 lb

## 2016-10-16 DIAGNOSIS — E869 Volume depletion, unspecified: Secondary | ICD-10-CM | POA: Diagnosis not present

## 2016-10-16 DIAGNOSIS — R509 Fever, unspecified: Secondary | ICD-10-CM

## 2016-10-16 DIAGNOSIS — N12 Tubulo-interstitial nephritis, not specified as acute or chronic: Secondary | ICD-10-CM | POA: Diagnosis not present

## 2016-10-16 DIAGNOSIS — R1084 Generalized abdominal pain: Secondary | ICD-10-CM

## 2016-10-16 DIAGNOSIS — R112 Nausea with vomiting, unspecified: Secondary | ICD-10-CM | POA: Diagnosis not present

## 2016-10-16 LAB — POCT URINALYSIS DIP (MANUAL ENTRY)
Bilirubin, UA: NEGATIVE
Blood, UA: NEGATIVE
Glucose, UA: NEGATIVE mg/dL
NITRITE UA: NEGATIVE
Spec Grav, UA: 1.015 (ref 1.010–1.025)
UROBILINOGEN UA: 1 U/dL

## 2016-10-16 LAB — POC MICROSCOPIC URINALYSIS (UMFC)

## 2016-10-16 LAB — POCT CBC
Granulocyte percent: 94.5 %G — AB (ref 37–80)
HEMATOCRIT: 43.7 % (ref 37.7–47.9)
Hemoglobin: 15.2 g/dL (ref 12.2–16.2)
LYMPH, POC: 0.5 — AB (ref 0.6–3.4)
MCH, POC: 29.4 pg (ref 27–31.2)
MCHC: 34.7 g/dL (ref 31.8–35.4)
MCV: 84.8 fL (ref 80–97)
MID (cbc): 0.3 (ref 0–0.9)
MPV: 8.8 fL (ref 0–99.8)
PLATELET COUNT, POC: 162 10*3/uL (ref 142–424)
POC Granulocyte: 12.6 — AB (ref 2–6.9)
POC LYMPH %: 3.5 % — AB (ref 10–50)
POC MID %: 2 %M (ref 0–12)
RBC: 5.15 M/uL (ref 4.04–5.48)
RDW, POC: 13.8 %
WBC: 13.3 10*3/uL — AB (ref 4.6–10.2)

## 2016-10-16 LAB — POCT URINE PREGNANCY: Preg Test, Ur: NEGATIVE

## 2016-10-16 LAB — GLUCOSE, POCT (MANUAL RESULT ENTRY): POC GLUCOSE: 87 mg/dL (ref 70–99)

## 2016-10-16 MED ORDER — ONDANSETRON 4 MG PO TBDP
4.0000 mg | ORAL_TABLET | Freq: Once | ORAL | Status: AC
  Administered 2016-10-16: 4 mg via ORAL

## 2016-10-16 MED ORDER — CEFTRIAXONE SODIUM 1 G IJ SOLR
1.0000 g | Freq: Once | INTRAMUSCULAR | Status: AC
  Administered 2016-10-16: 1 g via INTRAMUSCULAR

## 2016-10-16 MED ORDER — LEVOFLOXACIN 500 MG PO TABS: 500.0000 mg | ORAL_TABLET | Freq: Every day | ORAL | 0 refills | Status: DC

## 2016-10-16 MED ORDER — ACETAMINOPHEN 500 MG PO TABS
1000.0000 mg | ORAL_TABLET | Freq: Once | ORAL | Status: AC
  Administered 2016-10-16: 1000 mg via ORAL

## 2016-10-16 MED ORDER — ONDANSETRON 4 MG PO TBDP: 4.0000 mg | ORAL_TABLET | Freq: Three times a day (TID) | ORAL | 0 refills | Status: DC | PRN

## 2016-10-16 NOTE — Patient Instructions (Addendum)
You do appear to have a pyelonephritis or kidney infection. That is likely the cause of your back pain, abdominal pain, nausea, vomiting and fever. Although you were dehydrated when you arrived, that appears to be better after 1 L of IV fluids.  Small sips of fluids frequently to stay hydrated, Zofran if needed for nausea or vomiting, start Levaquin today. Recheck in 24 hours. If you are having increasing nausea and vomiting, worsening pain, or other worsening symptoms overnight, go to the emergency room.  Although we discussed the possible small amounts of medication passed through breast-feeding, would recommend you discuss whether or not to continue breast-feeding with your child's pediatrician or medical provider.   Return to the clinic or go to the nearest emergency room if any of your symptoms worsen or new symptoms occur.   Pyelonephritis, Adult Pyelonephritis is a kidney infection. The kidneys are the organs that filter a person's blood and move waste out of the bloodstream and into the urine. Urine passes from the kidneys, through the ureters, and into the bladder. There are two main types of pyelonephritis:  Infections that come on quickly without any warning (acute pyelonephritis).  Infections that last for a long period of time (chronic pyelonephritis). In most cases, the infection clears up with treatment and does not cause further problems. More severe infections or chronic infections can sometimes spread to the bloodstream or lead to other problems with the kidneys. What are the causes? This condition is usually caused by:  Bacteria traveling from the bladder to the kidney through infected urine. The urine in the bladder can become infected with bacteria from:  Bladder infection (cystitis).  Inflammation of the prostate gland (prostatitis).  Sexual intercourse, in females.  Bacteria traveling from the bloodstream to the kidney. What increases the risk? This condition is  more likely to develop in:  Pregnant women.  Older people.  People who have diabetes.  People who have kidney stones or bladder stones.  People who have other abnormalities of the kidney or ureter.  People who have a catheter placed in the bladder.  People who have cancer.  People who are sexually active.  Women who use spermicides.  People who have had a prior urinary tract infection. What are the signs or symptoms? Symptoms of this condition include:  Frequent urination.  Strong or persistent urge to urinate.  Burning or stinging when urinating.  Abdominal pain.  Back pain.  Pain in the side or flank area.  Fever.  Chills.  Blood in the urine, or dark urine.  Nausea.  Vomiting. How is this diagnosed? This condition may be diagnosed based on:  Medical history and physical exam.  Urine tests.  Blood tests. You may also have imaging tests of the kidneys, such as an ultrasound or CT scan. How is this treated? Treatment for this condition may depend on the severity of the infection.  If the infection is mild and is found early, you may be treated with antibiotic medicines taken by mouth. You will need to drink fluids to remain hydrated.  If the infection is more severe, you may need to stay in the hospital and receive antibiotics given directly into a vein through an IV tube. You may also need to receive fluids through an IV tube if you are not able to remain hydrated. After your hospital stay, you may need to take oral antibiotics for a period of time. Other treatments may be required, depending on the cause of the infection. Follow  these instructions at home: Medicines   Take over-the-counter and prescription medicines only as told by your health care provider.  If you were prescribed an antibiotic medicine, take it as told by your health care provider. Do not stop taking the antibiotic even if you start to feel better. General instructions   Drink  enough fluid to keep your urine clear or pale yellow.  Avoid caffeine, tea, and carbonated beverages. They tend to irritate the bladder.  Urinate often. Avoid holding in urine for long periods of time.  Urinate before and after sex.  After a bowel movement, women should cleanse from front to back. Use each tissue only once.  Keep all follow-up visits as told by your health care provider. This is important. Contact a health care provider if:  Your symptoms do not get better after 2 days of treatment.  Your symptoms get worse.  You have a fever. Get help right away if:  You are unable to take your antibiotics or fluids.  You have shaking chills.  You vomit.  You have severe flank or back pain.  You have extreme weakness or fainting. This information is not intended to replace advice given to you by your health care provider. Make sure you discuss any questions you have with your health care provider. Document Released: 06/18/2005 Document Revised: 11/24/2015 Document Reviewed: 10/11/2014 Elsevier Interactive Patient Education  2017 [employer].     IF you received an x-ray today, you will receive an invoice from [facility]. Please contact [facility] at [phone] with questions or concerns regarding your invoice.   IF you received labwork today, you will receive an invoice from [facility]. Please contact [facility] at [phone] with questions or concerns regarding your invoice.   Our billing staff will not be able to assist you with questions regarding bills from these companies.  You will be contacted with the lab results as soon as they are available. The fastest way to get your results is to activate your My Chart account. Instructions are located on the last page of this paperwork. If you have not heard from [place] regarding the results in 2 weeks, please contact this office.

## 2016-10-16 NOTE — Progress Notes (Addendum)
Subjective:  By signing my name below, I, Essence Howell, attest that this documentation has been prepared under the direction and in the presence of Shade Flood, MD Electronically Signed: Charline Bills, ED Scribe 10/16/2016 at 11:14 AM.   Patient ID: Mallory Craig, female    DOB: 02/27/1991, 26 y.o.   MRN: [id number]  Chief Complaint  Patient presents with  . Emesis    can't keep anything down   started this morning   . Dizziness    weak   . Nausea   HPI Mallory Craig is a 26 y.o. female who presents to Primary Care at [facility]. Seen in February for vomiting illness at that time associated with diarrhea. Treated for depression with Zoloft 200 mg. Treated with Zofran at that time, thought to have acute gastroenteritis. Given 2 L of IV. Appears to be taking Lexapro 10 mg at this time.   Today, pt presents with 2 episodes of emesis onset 7 AM this morning. She reports associated symptoms of fever of 101 F this morning, nausea, 3 episodes of dry heaving, abdominal pain, dizziness, fatigue. She states that her daughter was ill with emesis a few days ago as well. Pt denies diarrhea, vaginal bleeding, dysuria. No treatments tried PTA. She has not started [facility] yet; has not started having menstrual periods again since the vaginal birth of her daughter in 06/2016. She is currently breastfeeding. No h/o abdominal surgeries.  Patient Active Problem List   Diagnosis Date Noted  . Normal labor 06/22/2016  . Decreased fetal movement 06/20/2016  . Flu-like symptoms 06/20/2016  . Irregular uterine contractions 06/13/2016  . Abdominal pain affecting pregnancy 04/05/2016  . IUFD (intrauterine fetal death) 08/03/2015  . Family history of genetic disorder 06/09/2015  . Anxiety and depression 02/14/2015   Past Medical History:  Diagnosis Date  . Allergy    Claritin  . Anxiety   . Asthma    Childhood; last Albuterol use age 29  . 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 ADENOIDECTOMY    . WISDOM TOOTH EXTRACTION     Allergies  Allergen Reactions  . Bee Venom Swelling   Prior to Admission medications   Medication Sig Start Date End Date Taking? Authorizing Provider  calcium carbonate (TUMS) 500 MG chewable tablet Chew 1 tablet (200 mg of elemental calcium total) by mouth 3 (three) times daily with meals. Patient not taking: Reported on 10/16/2016 06/16/15   Rachelle A Denney, CNM  ibuprofen (ADVIL,MOTRIN) 600 MG tablet Take 1 tablet (600 mg total) by mouth every 6 (six) hours as needed for cramping. Patient not taking: Reported on 10/16/2016 06/24/16   Vena Austria, MD  Prenat w/o A-[id number]-FA-DHA [facility] HARMONY) 30-1-260 MG CAPS Take 1 tablet by mouth daily. Patient not taking: Reported on 10/16/2016 06/01/15   Roe Coombs, CNM   Social History   Social History  . Marital status: Single    Spouse name: N/A  . Number of children: N/A  . Years of education: N/A   Occupational History  . Not on file.   Social History Main Topics  . Smoking status: Never Smoker  . Smokeless tobacco: Never Used  . Alcohol use No  . Drug use: No  . Sexual activity: Yes    Birth control/ protection: IUD   Other Topics Concern  . Not on file   Social History Narrative   Marital status: married x 4 years.  Happily married; no abuse      Children:  2 children (2, 1)      Lives: with husband, 2 children.      Employment:  Homemaker      Tobacco: none      Alcohol: 2 drinks per week      Drugs:  None      Exercise:  No formal exercise.     Review of Systems  Constitutional: Positive for fatigue and fever.  Gastrointestinal: Positive for abdominal pain, nausea and vomiting. Negative for diarrhea.  Genitourinary: Negative for dysuria and vaginal bleeding.  Neurological: Positive for dizziness.      Objective:   Physical Exam  Constitutional: She is oriented to person, place, and time.  She appears well-developed and well-nourished. No distress.  HENT:  Head: Normocephalic and atraumatic.  Eyes: Conjunctivae and EOM are normal.  Neck: Neck supple. No tracheal deviation present.  Cardiovascular: Normal rate, regular rhythm and normal heart sounds.  Exam reveals no gallop and no friction rub.   No murmur heard. Pulmonary/Chest: Effort normal and breath sounds normal. No respiratory distress.  Abdominal: Soft. Bowel sounds are normal. There is tenderness (diffusely). There is guarding (throughout).  Musculoskeletal: Normal range of motion.  Neurological: She is alert and oriented to person, place, and time.  Skin: Skin is warm and dry.  Psychiatric: She has a normal mood and affect. Her behavior is normal.  Nursing note and vitals reviewed.  Vitals:   10/16/16 1032  BP: 115/80  Pulse: 93  Resp: 18  Temp: 97.5 F (36.4 C)  TempSrc: Oral  SpO2: 96%  Weight: 150 lb (68 kg)  Height:  (1.702 m)   Results for orders placed or performed in visit on 10/16/16  POCT CBC  Result Value Ref Range   WBC 13.3 (A) 4.6 - 10.2 K/uL   Lymph, poc 0.5 (A) 0.6 - 3.4   POC LYMPH PERCENT 3.5 (A) 10 - 50 %L   MID (cbc) 0.3 0 - 0.9   POC MID % 2.0 0 - 12 %M   POC Granulocyte 12.6 (A) 2 - 6.9   Granulocyte percent 94.5 (A) 37 - 80 %G   RBC 5.15 4.04 - 5.48 M/uL   Hemoglobin 15.2 12.2 - 16.2 g/dL   HCT, POC 09.8 11.9 - 47.9 %   MCV 84.8 80 - 97 fL   MCH, POC 29.4 27 - 31.2 pg   MCHC 34.7 31.8 - 35.4 g/dL   RDW, POC 14.7 %   Platelet Count, POC 162 142 - 424 K/uL   MPV 8.8 0 - 99.8 fL  POCT glucose (manual entry)  Result Value Ref Range   POC Glucose 87 70 - 99 mg/dl   Discussed Zofran and risks of breastfeeding infant.   12:24 PM-Improving, less nauseous, feels like her stomach is left sore. CBC results discussed.   1:04 PM.  First liter of IV fluid completed. Was able to provide a urine specimen. Repeat abdominal exam minimal tenderness epigastric, still with some  tenderness lower abdomen but without guarding. Orthostatics repeated, and normalized. On repeat exam, she does have bilateral CVA tenderness. Urinalysis results reviewed.  Orthostatic VS for the past 24 hrs:  BP- Lying Pulse- Lying BP- Sitting Pulse- Sitting BP- Standing at 0 minutes Pulse- Standing at 0 minutes  10/16/16 1310 113/65 79 116/71 79 102/66 78  10/16/16 1156 113/71 80 107/74 120 106/69 153   Results for orders placed or performed in visit on 10/16/16  POCT CBC  Result Value Ref Range   WBC 13.3 (A) 4.6 - 10.2 K/uL   Lymph, poc 0.5 (A) 0.6 - 3.4   POC LYMPH PERCENT 3.5 (A) 10 - 50 %L   MID (cbc) 0.3 0 - 0.9   POC MID % 2.0 0 - 12 %M   POC Granulocyte 12.6 (A) 2 - 6.9   Granulocyte percent 94.5 (A) 37 - 80 %G   RBC 5.15 4.04 - 5.48 M/uL   Hemoglobin 15.2 12.2 - 16.2 g/dL   HCT, POC 09.8 11.9 - 47.9 %   MCV 84.8 80 - 97 fL   MCH, POC 29.4 27 - 31.2 pg   MCHC 34.7 31.8 - 35.4 g/dL   RDW, POC 14.7 %   Platelet Count, POC 162 142 - 424 K/uL   MPV 8.8 0 - 99.8 fL  POCT glucose (manual entry)  Result Value Ref Range   POC Glucose 87 70 - 99 mg/dl  POCT urinalysis dipstick  Result Value Ref Range   Color, UA yellow yellow   Clarity, UA cloudy (A) clear   Glucose, UA negative negative mg/dL   Bilirubin, UA negative negative   Ketones, POC UA small (15) (A) negative mg/dL   Spec Grav, UA 8.295 6.213 - 1.025   Blood, UA negative negative   pH, UA >=9.0 (A) 5.0 - 8.0   Protein Ur, POC =100 (A) negative mg/dL   Urobilinogen, UA 1.0 0.2 or 1.0 E.U./dL   Nitrite, UA Negative Negative   Leukocytes, UA Large (3+) (A) Negative  POCT Microscopic Urinalysis ([facility])  Result Value Ref Range   WBC,UR,HPF,POC Many (A) None WBC/hpf   RBC,UR,HPF,POC None None RBC/hpf   Bacteria None None, Too numerous to count   Mucus Present (A) Absent   Epithelial Cells, UR Per Microscopy None None, Too numerous to count cells/hpf  POCT urine pregnancy  Result Value Ref Range   Preg Test, Ur  Negative Negative   Medications were discussed including with her breast-feeding. His medications were reviewed in book Mother's Milk with potential infection side effects discussed. Also I do recommend that she discuss with her house pediatrician whether or not they would like her to continue to breast-feed or supplement with formula she is on antibiotics.  Over one hour of care provided with multiple rechecks, evaluation and treatment. Over 50% of which was counseling.     Assessment & Plan:   Gladyse Corvin is a 26 y.o. female Nausea and vomiting, intractability of vomiting not specified, unspecified vomiting type - Plan: Orthostatic vital signs, ondansetron (ZOFRAN ODT) 4 MG disintegrating tablet, ondansetron (ZOFRAN-ODT) disintegrating tablet 4 mg, Comprehensive metabolic panel, POCT CBC, POCT glucose (manual entry), Insert peripheral IV, POCT urinalysis dipstick, POCT Microscopic Urinalysis ([facility])  Generalized abdominal pain - Plan: Orthostatic vital signs, Comprehensive metabolic panel, POCT CBC, POCT glucose (manual entry), Insert peripheral IV, POCT urinalysis dipstick, POCT Microscopic Urinalysis ([facility])  Volume depletion - Plan: Insert peripheral IV  Fever, unspecified  1 day history of n/v, generalized abdominal pain and fever at home. Initially volume depleted.  Leukocytosis on CBC, urinalysis and exam with CVAT consistent with pyelonephritis.   -1 L normal saline given with improvement of orthostatics. Tolerating sips of fluid in office. Zofran 4 mg given in office with control of nausea and vomiting. Continue Zofran at home with small sips of fluids frequently for maintenance of hydration.  -Rocephin 1 g given, start Levaquin 500 mg daily for 10 days, check urine culture.  -These  medications were discussed with her infant's pediatrician who did say that it was okay for her to breast-feed while taking these medications.   -Recheck in one day with Deliah Boston, PA-C. Overnight ER  precautions given if unable to tolerate fluids or oral medication.  -If not improving, consider CT scan as previous CT for abdominal pain in 2016 indicated possible terminal ileum wall thickening.    Meds ordered this encounter  Medications  . escitalopram (LEXAPRO) 10 MG tablet    Sig: Take 10 mg by mouth daily.  . ondansetron (ZOFRAN ODT) 4 MG disintegrating tablet    Sig: Take 1 tablet (4 mg total) by mouth every 8 (eight) hours as needed for nausea or vomiting.    Dispense:  10 tablet    Refill:  0  . ondansetron (ZOFRAN-ODT) disintegrating tablet 4 mg   Patient Instructions       IF you received an x-ray today, you will receive an invoice from [facility]. Please contact [facility] at [phone] with questions or concerns regarding your invoice.   IF you received labwork today, you will receive an invoice from [facility]. Please contact [facility] at [phone] with questions or concerns regarding your invoice.   Our billing staff will not be able to assist you with questions regarding bills from these companies.  You will be contacted with the lab results as soon as they are available. The fastest way to get your results is to activate your My Chart account. Instructions are located on the last page of this paperwork. If you have not heard from [place] regarding the results in 2 weeks, please contact this office.         I personally performed the services described in this documentation, which was scribed in my presence. The recorded information has been reviewed and considered for accuracy and completeness, addended by me as needed, and agree with information above.  Signed,   Meredith Staggers, MD Primary Care at [facility].  10/16/16 1:04 PM

## 2016-10-17 ENCOUNTER — Ambulatory Visit (INDEPENDENT_AMBULATORY_CARE_PROVIDER_SITE_OTHER): Payer: Commercial Managed Care - PPO | Admitting: Physician Assistant

## 2016-10-17 VITALS — BP 112/73 | HR 54 | Temp 97.5°F | Resp 16 | Ht 67.0 in | Wt 151.0 lb

## 2016-10-17 DIAGNOSIS — E869 Volume depletion, unspecified: Secondary | ICD-10-CM | POA: Diagnosis not present

## 2016-10-17 DIAGNOSIS — N12 Tubulo-interstitial nephritis, not specified as acute or chronic: Secondary | ICD-10-CM

## 2016-10-17 DIAGNOSIS — R1084 Generalized abdominal pain: Secondary | ICD-10-CM

## 2016-10-17 LAB — POCT URINALYSIS DIP (MANUAL ENTRY)
BILIRUBIN UA: NEGATIVE mg/dL
Bilirubin, UA: NEGATIVE
Blood, UA: NEGATIVE
GLUCOSE UA: NEGATIVE mg/dL
Nitrite, UA: NEGATIVE
Protein Ur, POC: NEGATIVE mg/dL
SPEC GRAV UA: 1.015 (ref 1.010–1.025)
Urobilinogen, UA: 1 E.U./dL
pH, UA: 6 (ref 5.0–8.0)

## 2016-10-17 LAB — COMPREHENSIVE METABOLIC PANEL
ALBUMIN: 4.6 g/dL (ref 3.5–5.5)
ALT: 13 IU/L (ref 0–32)
AST: 16 IU/L (ref 0–40)
Albumin/Globulin Ratio: 1.8 (ref 1.2–2.2)
Alkaline Phosphatase: 82 IU/L (ref 39–117)
BUN / CREAT RATIO: 26 — AB (ref 9–23)
BUN: 20 mg/dL (ref 6–20)
Bilirubin Total: 0.8 mg/dL (ref 0.0–1.2)
CALCIUM: 9.5 mg/dL (ref 8.7–10.2)
CO2: 18 mmol/L (ref 18–29)
CREATININE: 0.77 mg/dL (ref 0.57–1.00)
Chloride: 103 mmol/L (ref 96–106)
GFR, EST AFRICAN AMERICAN: 126 mL/min/{1.73_m2} (ref >59)
GFR, EST NON AFRICAN AMERICAN: 109 mL/min/{1.73_m2} (ref >59)
GLUCOSE: 88 mg/dL (ref 65–99)
Globulin, Total: 2.6 g/dL (ref 1.5–4.5)
Potassium: 3.9 mmol/L (ref 3.5–5.2)
Sodium: 143 mmol/L (ref 134–144)
TOTAL PROTEIN: 7.2 g/dL (ref 6.0–8.5)

## 2016-10-17 LAB — POCT CBC
GRANULOCYTE PERCENT: 71.9 % (ref 37–80)
HCT, POC: 43.1 % (ref 37.7–47.9)
Hemoglobin: 14.8 g/dL (ref 12.2–16.2)
Lymph, poc: 0.7 (ref 0.6–3.4)
MCH: 29.4 pg (ref 27–31.2)
MCHC: 34.3 g/dL (ref 31.8–35.4)
MCV: 85.5 fL (ref 80–97)
MID (CBC): 0.5 (ref 0–0.9)
MPV: 8.3 fL (ref 0–99.8)
POC GRANULOCYTE: 3.1 (ref 2–6.9)
POC LYMPH %: 16.8 % (ref 10–50)
POC MID %: 11.3 % (ref 0–12)
Platelet Count, POC: 150 10*3/uL (ref 142–424)
RBC: 5.04 M/uL (ref 4.04–5.48)
RDW, POC: 13.7 %
WBC: 4.3 10*3/uL — AB (ref 4.6–10.2)

## 2016-10-17 NOTE — Progress Notes (Signed)
SJSJ

## 2016-10-17 NOTE — Progress Notes (Signed)
10/17/2016 3:18 PM   DOB: 21-May-1991 / MRN: [id number]  SUBJECTIVE:  Mallory Craig is a 26 y.o. female presenting for recheck of pyelonephritis, abdominal pain and volume depletion.  She saw Dr. Neva Seat for this yesterday and was given 1 gram IM ceftriaxone and was started on po Levaquin. She has been medication compliant thus far.    Today she tells me that she feels 40% better.  She continues to deny dysuria. She has been trying to drink copious PO fluids however has had a difficult time with getting enough in.   She continues to complain of fatigue.   She is allergic to bee venom.   She  has a past medical history of Allergy; Anxiety; Asthma; and Depression.    She  reports that she has never smoked. She has never used smokeless tobacco. She reports that she does not drink alcohol or use drugs. She  reports that she currently engages in sexual activity. She reports using the following method of birth control/protection: IUD. The patient  has a past surgical history that includes Adenoidectomy and Wisdom tooth extraction.  Her family history includes Arthritis in her mother; Asthma in her brother; Cancer in her maternal grandfather, maternal grandmother, and mother; Hyperthyroidism in her mother.  Review of Systems  Constitutional: Positive for malaise/fatigue. Negative for chills, diaphoresis and fever.  Cardiovascular: Negative for chest pain.  Gastrointestinal: Positive for abdominal pain (improving).  Genitourinary: Negative for dysuria, flank pain, frequency, hematuria and urgency.  Musculoskeletal: Negative for myalgias.  Skin: Negative for rash.  Neurological: Negative for dizziness.    The problem list and medications were reviewed and updated by myself where necessary and exist elsewhere in the encounter.   OBJECTIVE:  BP 116/81   Pulse 77   Temp 97.5 F (36.4 C) (Oral)   Resp 16   Ht  (1.702 m)   Wt 151 lb (68.5 kg)   SpO2 99%   BMI 23.65 kg/m   Pulse  Readings from Last 3 Encounters:  10/17/16 77  10/16/16 93  06/24/16 (!) 58   BP Readings from Last 3 Encounters:  10/17/16 116/81  10/16/16 115/80  06/24/16 118/69   Temp Readings from Last 3 Encounters:  10/17/16 97.5 F (36.4 C) (Oral)  10/16/16 97.5 F (36.4 C) (Oral)  06/24/16 97.6 F (36.4 C) (Oral)    Wt Readings from Last 3 Encounters:  10/17/16 151 lb (68.5 kg)  10/16/16 150 lb (68 kg)  06/23/16 170 lb (77.1 kg)    Orthostatic VS for the past 24 hrs:  BP- Lying Pulse- Lying BP- Sitting Pulse- Sitting BP- Standing at 0 minutes Pulse- Standing at 0 minutes  10/17/16 1442 105/68 55 102/68 84 94/69 100    Physical Exam  Constitutional: She is oriented to person, place, and time. She is active.  Non-toxic appearance.  Cardiovascular: Normal rate, regular rhythm, normal heart sounds and intact distal pulses.   Pulmonary/Chest: Effort normal. No tachypnea.  Abdominal: Soft. Bowel sounds are normal. She exhibits no distension and no mass. There is tenderness (diffuse (mild)). There is no rebound and no guarding.  Musculoskeletal: Normal range of motion.  Neurological: She is alert and oriented to person, place, and time. No cranial nerve deficit.  Skin: Skin is warm and dry. She is not diaphoretic. No pallor.    Results for orders placed or performed in visit on 10/17/16 (from the past 72 hour(s))  POCT CBC     Status: Abnormal   Collection Time: 10/17/16  2:30 PM  Result Value Ref Range   WBC 4.3 (A) 4.6 - 10.2 K/uL   Lymph, poc 0.7 0.6 - 3.4   POC LYMPH PERCENT 16.8 10 - 50 %L   MID (cbc) 0.5 0 - 0.9   POC MID % 11.3 0 - 12 %M   POC Granulocyte 3.1 2 - 6.9   Granulocyte percent 71.9 37 - 80 %G   RBC 5.04 4.04 - 5.48 M/uL   Hemoglobin 14.8 12.2 - 16.2 g/dL   HCT, POC 78.2 95.6 - 47.9 %   MCV 85.5 80 - 97 fL   MCH, POC 29.4 27 - 31.2 pg   MCHC 34.3 31.8 - 35.4 g/dL   RDW, POC 21.3 %   Platelet Count, POC 150 142 - 424 K/uL   MPV 8.3 0 - 99.8 fL  POCT  urinalysis dipstick     Status: Abnormal   Collection Time: 10/17/16  2:34 PM  Result Value Ref Range   Color, UA yellow yellow   Clarity, UA clear clear   Glucose, UA negative negative mg/dL   Bilirubin, UA negative negative   Ketones, POC UA negative negative mg/dL   Spec Grav, UA 0.865 7.846 - 1.025   Blood, UA negative negative   pH, UA 6.0 5.0 - 8.0   Protein Ur, POC negative negative mg/dL   Urobilinogen, UA 1.0 0.2 or 1.0 E.U./dL   Nitrite, UA Negative Negative   Leukocytes, UA Small (1+) (A) Negative    No results found.  ASSESSMENT AND PLAN:  Khalilah was seen today for fatigue, nausea and abdominal pain.  Diagnoses and all orders for this visit:  Pyelonephritis: CBC and urine studies much improved today. Will continue current treatment plan.  -     POCT CBC -     POCT urinalysis dipstick -     Orthostatic vital signs  Generalized abdominal pain: Mild tenderness on today's exam however negative for guarding, an improvement from yesterday's exam.  Given her overall improvement with PO antibiotics I don't think there is any benefit with higher level imaging. .   Volume depletion: She is quite orthostatic.  1 liter NS today. Advised she drink copious fluids. She is up 1 lbs today however down 19 from her last visit with [place] in December.       The patient is advised to call or return to clinic if she does not see an improvement in symptoms, or to seek the care of the closest emergency department if she worsens with the above plan.   Deliah Boston, MHS, PA-C [facility] 10/17/2016 3:18 PM

## 2016-10-17 NOTE — Patient Instructions (Signed)
     IF you received an x-ray today, you will receive an invoice from [facility]. Please contact [facility] at [phone] with questions or concerns regarding your invoice.   IF you received labwork today, you will receive an invoice from [facility]. Please contact [facility] at [phone] with questions or concerns regarding your invoice.   Our billing staff will not be able to assist you with questions regarding bills from these companies.  You will be contacted with the lab results as soon as they are available. The fastest way to get your results is to activate your My Chart account. Instructions are located on the last page of this paperwork. If you have not heard from us regarding the results in 2 weeks, please contact this office.     

## 2017-10-23 ENCOUNTER — Ambulatory Visit (INDEPENDENT_AMBULATORY_CARE_PROVIDER_SITE_OTHER): Payer: Commercial Managed Care - PPO | Admitting: Obstetrics and Gynecology

## 2017-10-23 ENCOUNTER — Encounter: Payer: Self-pay | Admitting: Obstetrics and Gynecology

## 2017-10-23 VITALS — BP 120/80 | Ht 67.0 in | Wt 157.0 lb

## 2017-10-23 DIAGNOSIS — Z30014 Encounter for initial prescription of intrauterine contraceptive device: Secondary | ICD-10-CM

## 2017-10-23 DIAGNOSIS — N3001 Acute cystitis with hematuria: Secondary | ICD-10-CM | POA: Diagnosis not present

## 2017-10-23 LAB — POCT URINALYSIS DIPSTICK
PH UA: 6.5 (ref 5.0–8.0)
SPEC GRAV UA: 1.01 (ref 1.010–1.025)

## 2017-10-23 MED ORDER — NITROFURANTOIN MONOHYD MACRO 100 MG PO CAPS: 100.0000 mg | ORAL_CAPSULE | Freq: Two times a day (BID) | ORAL | 0 refills | Status: AC

## 2017-10-23 NOTE — Patient Instructions (Signed)
I value your feedback and entrusting us with your care. If you get a [facility] patient survey, I would appreciate you taking the time to let us know about your experience today. Thank you! 

## 2017-10-23 NOTE — Progress Notes (Signed)
Mallory Craig, Mallory Craig, Mallory Craig   Chief Complaint  Patient presents with  . Abdominal Pain    uti    HPI:      Mallory Craig is a 27 y.o. [id number] who LMP was Patient's last menstrual period was 10/01/2017., presents today for NP eval of urinary frequency/urgency with suprapubic pressure, mild LBP for the past 4 days. No hematuria, dysuria, fevers. Hx of pyelo 4/18. No vag sx, no vag bleeding. Pt taking AZO for sx.   She is sex active with husband. She did POP after pregnancy and during breastfeeding. She has since stopped BF and is having regular menses. She is interested in IUD.     Past Medical History:  Diagnosis Date  . Allergy    Claritin  . Anxiety   . Asthma    Childhood; last Albuterol use age 27  . 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 ADENOIDECTOMY    . WISDOM TOOTH EXTRACTION      Family History  Problem Relation Age of Onset  . Cancer Mother        skin cancer  . Arthritis Mother   . Hyperthyroidism Mother   . Cancer Maternal Grandmother   . Cancer Maternal Grandfather   . Asthma Brother     Social History   Socioeconomic History  . Marital status: Single    Spouse name: Not on file  . Number of children: Not on file  . Years of education: Not on file  . Highest education level: Not on file  Occupational History  . Not on file  Social Needs  . Financial resource strain: Not on file  . Food insecurity:    Worry: Not on file    Inability: Not on file  . Transportation needs:    Medical: Not on file    Non-medical: Not on file  Tobacco Use  . Smoking status: Never Smoker  . Smokeless tobacco: Never Used  Substance and Sexual Activity  . Alcohol use: No    Alcohol/week: 0.0 oz  . Drug use: No  . Sexual activity: Yes    Birth control/protection: IUD  Lifestyle  . Physical activity:    Days per week: Not on file    Minutes per session: Not on file  . Stress: Not  on file  Relationships  . Social connections:    Talks on phone: Not on file    Gets together: Not on file    Attends religious service: Not on file    Active member of club or organization: Not on file    Attends meetings of clubs or organizations: Not on file    Relationship status: Not on file  . Intimate partner violence:    Fear of current or ex partner: Not on file    Emotionally abused: Not on file    Physically abused: Not on file    Forced sexual activity: Not on file  Other Topics Concern  . Not on file  Social History Narrative   Marital status: married x 4 years.  Happily married; no abuse      Children:  2 children (2, 1)      Lives: with husband, 2 children.      Employment:  Homemaker      Tobacco: none      Alcohol: 2 drinks per week      Drugs:  None  Exercise:  No formal exercise.      Outpatient Medications Prior to Visit  Medication Sig Dispense Refill  . escitalopram (LEXAPRO) 10 MG tablet Take 10 mg by mouth daily.    Marland Kitchen levofloxacin (LEVAQUIN) 500 MG tablet Take 1 tablet (500 mg total) by mouth daily. (Patient not taking: Reported on 10/23/2017) 10 tablet 0  . ondansetron (ZOFRAN ODT) 4 MG disintegrating tablet Take 1 tablet (4 mg total) by mouth every 8 (eight) hours as needed for nausea or vomiting. (Patient not taking: Reported on 10/23/2017) 10 tablet 0   No facility-administered medications prior to visit.       ROS:  Review of Systems  Constitutional: Negative for fatigue, fever and unexpected weight change.  Respiratory: Negative for cough, shortness of breath and wheezing.   Cardiovascular: Negative for chest pain, palpitations and leg swelling.  Gastrointestinal: Negative for blood in stool, constipation, diarrhea, nausea and vomiting.  Endocrine: Negative for cold intolerance, heat intolerance and polyuria.  Genitourinary: Positive for frequency and urgency. Negative for dyspareunia, dysuria, flank pain, genital sores, hematuria,  menstrual problem, pelvic pain, vaginal bleeding, vaginal discharge and vaginal pain.  Musculoskeletal: Positive for back pain. Negative for joint swelling and myalgias.  Skin: Negative for rash.  Neurological: Positive for headaches. Negative for dizziness, syncope, light-headedness and numbness.  Hematological: Negative for adenopathy.  Psychiatric/Behavioral: Negative for agitation, confusion, sleep disturbance and suicidal ideas. The patient is not nervous/anxious.     OBJECTIVE:   Vitals:  BP 120/80   Ht 5\' 7"  (1.702 Craig)   Wt 157 lb (71.2 kg)   LMP 10/01/2017   BMI 24.59 kg/Craig   Physical Exam  Constitutional: She is oriented to person, place, and time. She appears well-developed.  Neck: Normal range of motion.  Pulmonary/Chest: Effort normal.  Abdominal: Soft. She exhibits no mass. There is tenderness in the suprapubic area. There is no guarding and no CVA tenderness.  Musculoskeletal: Normal range of motion.  Neurological: She is alert and oriented to person, place, and time.  Psychiatric: She has a normal mood and affect. Her behavior is normal. Judgment and thought content normal.  Vitals reviewed.   Results: Results for orders placed or performed in visit on 10/23/17 (from the past 24 hour(s))  POCT Urinalysis Dipstick     Status: Abnormal   Collection Time: 10/23/17  3:46 PM  Result Value Ref Range   Color, UA orange    Clarity, UA clear    Glucose, UA     Bilirubin, UA     Ketones, UA     Spec Grav, UA 1.010 1.010 - 1.025   Blood, UA small    pH, UA 6.5 5.0 - 8.0   Protein, UA     Urobilinogen, UA  0.2 or 1.0 E.U./dL   Nitrite, UA     Leukocytes, UA Small (1+) (A) Negative   Appearance     Odor     Pt taking AZO  Assessment/Plan: Acute cystitis with hematuria - Rx macrobid. Check C&S. F/u prn. - Plan: POCT Urinalysis Dipstick, Urine Culture, nitrofurantoin, macrocrystal-monohydrate, (MACROBID) 100 MG capsule  Encounter for initial prescription of  intrauterine contraceptive device (IUD) - IUD discussed and handout given. Pt interested in Mirena. RTO with menses for insertion. NSAIDs. Pros/cons/risks discussed.    Meds ordered this encounter  Medications  . nitrofurantoin, macrocrystal-monohydrate, (MACROBID) 100 MG capsule    Sig: Take 1 capsule (100 mg total) by mouth 2 (two) times daily for 5 days.  Dispense:  10 capsule    Refill:  0    Order Specific Question:   Supervising Provider    Answer:   Nadara Mustard [[id number]]      Return if symptoms worsen or fail to improve.  Marshawn Ninneman B. Bolivar Koranda, PA-C 10/23/2017 3:48 PM

## 2017-10-25 LAB — URINE CULTURE

## 2017-10-30 ENCOUNTER — Other Ambulatory Visit: Payer: Self-pay | Admitting: Obstetrics and Gynecology

## 2017-10-30 ENCOUNTER — Telehealth: Payer: Self-pay

## 2017-10-30 MED ORDER — CIPROFLOXACIN HCL 500 MG PO TABS: 500.0000 mg | ORAL_TABLET | Freq: Two times a day (BID) | ORAL | 0 refills | Status: AC

## 2017-10-30 NOTE — Telephone Encounter (Signed)
Rx cipro eRxd. F/u prn.

## 2017-10-30 NOTE — Telephone Encounter (Signed)
Please advise. Thank you

## 2017-10-30 NOTE — Progress Notes (Signed)
Rx cipro since still sx after macrobid tx. F/u prn.

## 2017-10-30 NOTE — Telephone Encounter (Signed)
Pt was seen last Thurs for UTI, given Macrobid, finished it on Sunday, now feels like UTI is back.  Can she have something stronger?  [phone]

## 2017-11-07 ENCOUNTER — Encounter (HOSPITAL_COMMUNITY): Payer: Self-pay | Admitting: Emergency Medicine

## 2017-11-07 ENCOUNTER — Emergency Department (HOSPITAL_COMMUNITY)
Admission: EM | Admit: 2017-11-07 | Discharge: 2017-11-08 | Disposition: A | Discharge status: Home / Self Care | Payer: Self-pay | Attending: Emergency Medicine | Admitting: Emergency Medicine

## 2017-11-07 DIAGNOSIS — R1030 Lower abdominal pain, unspecified: Secondary | ICD-10-CM | POA: Insufficient documentation

## 2017-11-07 DIAGNOSIS — Z79899 Other long term (current) drug therapy: Secondary | ICD-10-CM | POA: Insufficient documentation

## 2017-11-07 DIAGNOSIS — R197 Diarrhea, unspecified: Secondary | ICD-10-CM | POA: Insufficient documentation

## 2017-11-07 DIAGNOSIS — J45909 Unspecified asthma, uncomplicated: Secondary | ICD-10-CM | POA: Insufficient documentation

## 2017-11-07 DIAGNOSIS — R112 Nausea with vomiting, unspecified: Secondary | ICD-10-CM | POA: Insufficient documentation

## 2017-11-07 LAB — CBC
HEMATOCRIT: 43.5 % (ref 36.0–46.0)
Hemoglobin: 14.8 g/dL (ref 12.0–15.0)
MCH: 29.5 pg (ref 26.0–34.0)
MCHC: 34 g/dL (ref 30.0–36.0)
MCV: 86.7 fL (ref 78.0–100.0)
Platelets: 186 10*3/uL (ref 150–400)
RBC: 5.02 MIL/uL (ref 3.87–5.11)
RDW: 13.1 % (ref 11.5–15.5)
WBC: 11.9 10*3/uL — ABNORMAL HIGH (ref 4.0–10.5)

## 2017-11-07 MED ORDER — ONDANSETRON 4 MG PO TBDP
4.0000 mg | ORAL_TABLET | Freq: Once | ORAL | Status: AC
  Administered 2017-11-07: 4 mg via ORAL
  Filled 2017-11-07: qty 1

## 2017-11-07 NOTE — ED Notes (Signed)
Pt reports that both of her children have similar symptoms that started 3 days ago she states "im dying, you need to help me, I need to lay down." Pt instructed that she will be waiting for a room in the lobby at this time until a room becomes assigned and available to her.

## 2017-11-07 NOTE — ED Triage Notes (Signed)
Pt arrived EMS with c/o nausea/vomiting and RUQ pain and R flank pain. EMS states that she has a hx of a bladder infection and took the full treatment of macrobid Vitals WNL BP137/82 P100 RR20 O2 100%RA CBG 105

## 2017-11-08 ENCOUNTER — Emergency Department (HOSPITAL_COMMUNITY): Payer: Self-pay

## 2017-11-08 LAB — COMPREHENSIVE METABOLIC PANEL
ALT: 13 U/L — AB (ref 14–54)
AST: 16 U/L (ref 15–41)
Albumin: 4.3 g/dL (ref 3.5–5.0)
Alkaline Phosphatase: 83 U/L (ref 38–126)
Anion gap: 10 (ref 5–15)
BILIRUBIN TOTAL: 1.3 mg/dL — AB (ref 0.3–1.2)
BUN: 11 mg/dL (ref 6–20)
CALCIUM: 9.2 mg/dL (ref 8.9–10.3)
CO2: 24 mmol/L (ref 22–32)
Chloride: 105 mmol/L (ref 101–111)
Creatinine, Ser: 0.85 mg/dL (ref 0.44–1.00)
GFR calc Af Amer: >60 mL/min (ref >60)
Glucose, Bld: 99 mg/dL (ref 65–99)
Potassium: 3.6 mmol/L (ref 3.5–5.1)
Sodium: 139 mmol/L (ref 135–145)
TOTAL PROTEIN: 7.2 g/dL (ref 6.5–8.1)

## 2017-11-08 LAB — URINALYSIS, ROUTINE W REFLEX MICROSCOPIC
BILIRUBIN URINE: NEGATIVE
GLUCOSE, UA: NEGATIVE mg/dL
Hgb urine dipstick: NEGATIVE
Ketones, ur: 20 mg/dL — AB
LEUKOCYTES UA: NEGATIVE
Nitrite: NEGATIVE
PH: 8 (ref 5.0–8.0)
Protein, ur: 30 mg/dL — AB
Specific Gravity, Urine: 1.02 (ref 1.005–1.030)

## 2017-11-08 LAB — I-STAT BETA HCG BLOOD, ED (MC, WL, AP ONLY): I-stat hCG, quantitative: <5 m[IU]/mL (ref <5)

## 2017-11-08 LAB — LIPASE, BLOOD: Lipase: 29 U/L (ref 11–51)

## 2017-11-08 MED ORDER — FAMOTIDINE 20 MG PO TABS: 20.0000 mg | ORAL_TABLET | Freq: Two times a day (BID) | ORAL | 0 refills | Status: DC

## 2017-11-08 MED ORDER — KETOROLAC TROMETHAMINE 30 MG/ML IJ SOLN
30.0000 mg | Freq: Once | INTRAMUSCULAR | Status: AC
  Administered 2017-11-08: 30 mg via INTRAVENOUS
  Filled 2017-11-08: qty 1

## 2017-11-08 MED ORDER — ONDANSETRON HCL 4 MG PO TABS: 4.0000 mg | ORAL_TABLET | Freq: Four times a day (QID) | ORAL | 0 refills | Status: DC

## 2017-11-08 MED ORDER — ONDANSETRON HCL 4 MG/2ML IJ SOLN
4.0000 mg | Freq: Once | INTRAMUSCULAR | Status: AC
  Administered 2017-11-08: 4 mg via INTRAVENOUS
  Filled 2017-11-08: qty 2

## 2017-11-08 MED ORDER — SODIUM CHLORIDE 0.9 % IV BOLUS
1000.0000 mL | Freq: Once | INTRAVENOUS | Status: AC
  Administered 2017-11-08: 1000 mL via INTRAVENOUS

## 2017-11-08 MED ORDER — IOPAMIDOL (ISOVUE-300) INJECTION 61%
100.0000 mL | Freq: Once | INTRAVENOUS | Status: AC | PRN
  Administered 2017-11-08: 75 mL via INTRAVENOUS

## 2017-11-08 NOTE — ED Notes (Signed)
Pt ambulatory to restroom with minimal assistance from this NT. Pt HR was 95 upon standing and remained about the same until ready to ambulate. Pt reports slight lightheadedness while ambulating, but no dizziness. Pt returned to bed and is resting in bed at this time.

## 2017-11-08 NOTE — ED Provider Notes (Signed)
[facility] EMERGENCY DEPARTMENT Provider Note   CSN: [id number] Arrival date & time: 11/07/17  2309     History   Chief Complaint Chief Complaint  Patient presents with  . Abdominal Pain  . Emesis    HPI Mallory Craig is a 27 y.o. female with history of anxiety presents with a 12-hour history of right sided abdominal pain, nausea, vomiting. She has had some associated right-sided back pain. She denies any diarrhea or bloody stools.  Her pain is constant.  She is not able to keep any fluids down.  She has had associated lightheadedness, especially with standing.  She denies any fevers, chest pain, shortness of breath, cough, abnormal vaginal bleeding or discharge, urinary symptoms.  She has not taken any medications prior to arrival.  HPI  Past Medical History:  Diagnosis Date  . Allergy    Claritin  . Anxiety   . Asthma    Childhood; last Albuterol use age 33  . 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 Family history of genetic disorder 06/09/2015  . Anxiety and depression 02/14/2015    Past Surgical History:  Procedure Laterality Date  . ADENOIDECTOMY    . WISDOM TOOTH EXTRACTION       OB History    Gravida  4   Para  4   Term  3   Preterm  1   AB      Living  3     SAB      TAB      Ectopic      Multiple  0   Live Births  3            Home Medications    Prior to Admission medications   Medication Sig Start Date End Date Taking? Authorizing Provider  ibuprofen (ADVIL,MOTRIN) 200 MG tablet Take 200-800 mg by mouth every 6 (six) hours as needed for moderate pain.   Yes [provider]  ondansetron (ZOFRAN ODT) 4 MG disintegrating tablet Take 1 tablet (4 mg total) by mouth every 8 (eight) hours as needed for nausea or vomiting. 10/16/16  Yes Shade Flood, MD  famotidine (PEPCID) 20 MG tablet Take 1 tablet (20 mg total) by mouth 2  (two) times daily. 11/08/17   Destanee Bedonie, Waylan Boga, PA-C  ondansetron (ZOFRAN) 4 MG tablet Take 1 tablet (4 mg total) by mouth every 6 (six) hours. 11/08/17   Emi Holes, PA-C    Family History Family History  Problem Relation Age of Onset  . Cancer Mother        skin cancer  . Arthritis Mother   . Hyperthyroidism Mother   . Cancer Maternal Grandmother   . Cancer Maternal Grandfather   . Asthma Brother     Social History Social History   Tobacco Use  . Smoking status: Never Smoker  . Smokeless tobacco: Never Used  Substance Use Topics  . Alcohol use: No    Alcohol/week: 0.0 oz  . Drug use: No     Allergies   Bee venom   Review of Systems Review of Systems  Constitutional: Negative for chills and fever.  HENT: Negative for facial swelling and sore throat.   Respiratory: Negative for shortness of breath.   Cardiovascular: Negative for chest pain.  Gastrointestinal: Positive for abdominal pain, nausea and vomiting. Negative for blood in stool and diarrhea.  Genitourinary: Negative for  dysuria, vaginal bleeding and vaginal discharge.  Musculoskeletal: Positive for back pain (R lower).  Skin: Negative for rash and wound.  Neurological: Negative for headaches.  Psychiatric/Behavioral: The patient is not nervous/anxious.      Physical Exam Updated Vital Signs BP (!) 103/56   Pulse 70   Temp 98.2 F (36.8 C) (Oral)   Resp 18   LMP 10/31/2017 (Exact Date)   SpO2 100%   Physical Exam  Constitutional: She appears well-developed and well-nourished. No distress.  HENT:  Head: Normocephalic and atraumatic.  Mouth/Throat: Oropharynx is clear and moist. No oropharyngeal exudate.  Eyes: Pupils are equal, round, and reactive to light. Conjunctivae are normal. Right eye exhibits no discharge. Left eye exhibits no discharge. No scleral icterus.  Neck: Normal range of motion. Neck supple. No thyromegaly present.  Cardiovascular: Normal rate, regular rhythm, normal heart  sounds and intact distal pulses. Exam reveals no gallop and no friction rub.  No murmur heard. Pulmonary/Chest: Effort normal and breath sounds normal. No stridor. No respiratory distress. She has no wheezes. She has no rales.  Abdominal: Soft. Bowel sounds are normal. She exhibits no distension. There is tenderness in the right upper quadrant and right lower quadrant. There is no rebound, no guarding and no CVA tenderness.  Genitourinary:  Genitourinary Comments: Patient declined  Musculoskeletal: She exhibits no edema.       Back:  Lymphadenopathy:    She has no cervical adenopathy.  Neurological: She is alert. Coordination normal.  Skin: Skin is warm and dry. No rash noted. She is not diaphoretic. No pallor.  Psychiatric: She has a normal mood and affect.  Nursing note and vitals reviewed.    ED Treatments / Results  Labs (all labs ordered are listed, but only abnormal results are displayed) Labs Reviewed  COMPREHENSIVE METABOLIC PANEL - Abnormal; Notable for the following components:      Result Value   ALT 13 (*)    Total Bilirubin 1.3 (*)    All other components within normal limits  CBC - Abnormal; Notable for the following components:   WBC 11.9 (*)    All other components within normal limits  URINALYSIS, ROUTINE W REFLEX MICROSCOPIC - Abnormal; Notable for the following components:   APPearance HAZY (*)    Ketones, ur 20 (*)    Protein, ur 30 (*)    Bacteria, UA RARE (*)    All other components within normal limits  LIPASE, BLOOD  I-STAT BETA HCG BLOOD, ED ([facility], [facility], [facility] ONLY)    EKG None  Radiology Ct Abdomen Pelvis W Contrast  Result Date: 11/08/2017 CLINICAL DATA:  Nausea, vomiting, RIGHT abdominal pain, suspected appendicitis EXAM: CT ABDOMEN AND PELVIS WITH CONTRAST TECHNIQUE: Multidetector CT imaging of the abdomen and pelvis was performed using the standard protocol following bolus administration of intravenous contrast. Sagittal and coronal MPR images  reconstructed from axial data set. CONTRAST:  75mL ISOVUE-300 IOPAMIDOL (ISOVUE-300) INJECTION 61% IV. No oral contrast administered. COMPARISON:  None FINDINGS: Lower chest: Lung bases clear Hepatobiliary: Gallbladder and liver normal appearance Pancreas: Normal appearance Spleen: Normal appearance Adrenals/Urinary Tract: Adrenal glands, kidneys, ureters, and bladder normal appearance Stomach/Bowel: Normal appendix. Stool throughout colon. Stomach and bowel loops otherwise normal appearance for exam lacking GI contrast. Vascular/Lymphatic: Aorta normal caliber. Vascular structures patent. Numerous prominent vessels in the parametrial regions/adnexa bilaterally, nonspecific but can be seen with pelvic congestion syndrome. Few scattered normal sized lymph nodes without abdominal or pelvic adenopathy. Reproductive: Uterus and ovaries normal appearance Other:  Umbilical hernia containing fat. No free air or free fluid. No definite inflammatory process. Musculoskeletal: Few scattered Schmorl's nodes in lumbar spine. No acute osseous findings. IMPRESSION: Normal appendix. Small umbilical hernia containing fat. Prominent vessels in the adnexa bilaterally, nonspecific but can be seen with pelvic congestion syndrome. Electronically Signed   By: Ulyses Southward M.D.   On: 11/08/2017 08:27    Procedures Procedures (including critical care time)  Medications Ordered in ED Medications  ondansetron (ZOFRAN-ODT) disintegrating tablet 4 mg (4 mg Oral Given 11/07/17 2334)  sodium chloride 0.9 % bolus 1,000 mL (0 mLs Intravenous Stopped 11/08/17 0749)  ondansetron (ZOFRAN) injection 4 mg (4 mg Intravenous Given 11/08/17 0644)  ketorolac (TORADOL) 30 MG/ML injection 30 mg (30 mg Intravenous Given 11/08/17 0644)  iopamidol (ISOVUE-300) 61 % injection 100 mL (75 mLs Intravenous Contrast Given 11/08/17 0726)  sodium chloride 0.9 % bolus 1,000 mL (0 mLs Intravenous Stopped 11/08/17 1015)     Initial Impression / Assessment and Plan  / ED Course  I have reviewed the triage vital signs and the nursing notes.  Pertinent labs & imaging results that were available during my care of the patient were reviewed by me and considered in my medical decision making (see chart for details).  Clinical Course as of Nov 09 1122  Fri Nov 08, 2017  9811 On reassessment after fluids, Zofran, Toradol, patient states she is feeling better and better.  She continues to decline pelvic exam, despite no significant findings on CT.  She would like to PO challenge and go home.  Repeat abdominal exam is improved with some mild right lower quadrant tenderness.   [AL]    Clinical Course User Index [AL] Emi Holes, PA-C    Patient with suspected gastroenteritis.  She had associated dehydration which was improved with 2 L of fluid.  She is no longer orthostatic.  Urine shows no infection, but 20 ketones and 30 protein.  Mild leukocytosis at 11.9.  CMP shows mild elevation in total bilirubin, 1.3.  CT of the pelvis was conducted to rule out appendicitis which shows normal appendix, and possible pelvic congestion syndrome.  Patient declined pelvic exam.  She is feeling better after 2 L of fluid, Toradol, Zofran.  Will discharge home with Pepcid and Zofran and follow-up to PCP.  She is tolerating oral fluids and eating [employer] prior to discharge. Return precautions discussed.  Patient understands and agrees with plan.  Patient vitals stable throughout ED course and discharged in satisfactory condition.  Final Clinical Impressions(s) / ED Diagnoses   Final diagnoses:  Nausea vomiting and diarrhea  Lower abdominal pain    ED Discharge Orders        Ordered    ondansetron (ZOFRAN) 4 MG tablet  Every 6 hours     11/08/17 1018    famotidine (PEPCID) 20 MG tablet  2 times daily     11/08/17 [address] [place] 11/08/17 1124    Azalia Bilis, MD 11/08/17 2305

## 2017-11-08 NOTE — ED Notes (Signed)
Labs reviewed.

## 2017-11-08 NOTE — Discharge Instructions (Signed)
Medications: Zofran, Pepcid  Treatment: Take Zofran every 6 hours as needed for nausea or vomiting.  Take Pepcid twice daily to help with irritation in your stomach lining from vomiting.  Progress your diet as tolerated.  Begin with plan foods.  Avoid any greasy, fatty, spicy foods.  Follow-up: Please follow-up with your doctor for recheck in 2 to 3 days.  Please return to the emergency department if you develop any new or worsening symptoms.

## 2017-11-08 NOTE — ED Notes (Signed)
Pt. In room, vital signs stable. No family @ bedside currently

## 2017-11-08 NOTE — ED Notes (Signed)
Pt returned from CT at this time.  

## 2017-11-08 NOTE — ED Notes (Signed)
Patient transported to CT 

## 2017-11-08 NOTE — ED Notes (Signed)
Pt refusing to provide urine sample. States that she "cant walk" because she's dizzy

## 2018-02-15 ENCOUNTER — Other Ambulatory Visit: Payer: Self-pay | Admitting: Maternal Newborn

## 2018-02-15 DIAGNOSIS — B379 Candidiasis, unspecified: Secondary | ICD-10-CM

## 2018-02-15 MED ORDER — FLUCONAZOLE 150 MG PO TABS: 150.0000 mg | ORAL_TABLET | Freq: Once | ORAL | 0 refills | Status: DC

## 2018-02-15 NOTE — Progress Notes (Signed)
Sent Rx for Diflucan; nurse to notify her to come in for appointment on Monday if no improvement in symptoms.

## 2018-05-07 ENCOUNTER — Telehealth: Payer: Self-pay

## 2018-05-07 ENCOUNTER — Other Ambulatory Visit: Payer: Self-pay | Admitting: Obstetrics and Gynecology

## 2018-05-07 DIAGNOSIS — B379 Candidiasis, unspecified: Secondary | ICD-10-CM

## 2018-05-07 MED ORDER — FLUCONAZOLE 150 MG PO TABS: 150.0000 mg | ORAL_TABLET | Freq: Once | ORAL | 0 refills | Status: AC

## 2018-05-07 NOTE — Telephone Encounter (Signed)
Rx RF diflucan eRxd. RN to notify pt. Also add probiotics to prevent recurrences. Thx.

## 2018-05-07 NOTE — Progress Notes (Signed)
Rx RF for yeast vag sx.  

## 2018-05-07 NOTE — Telephone Encounter (Signed)
Called pt, voicemail box full and cannot accept any msgs.

## 2018-05-07 NOTE — Telephone Encounter (Signed)
Pt calling because she would like to get a refill of the yeast medication that was previously given, she is experiencing the same symptoms as she was last time, Thank you!

## 2018-05-08 ENCOUNTER — Telehealth: Payer: Self-pay

## 2018-05-08 NOTE — Telephone Encounter (Signed)
Pt returned phone call, and stated she picked up Rx yesterday.

## 2018-05-08 NOTE — Telephone Encounter (Signed)
Pt returning phone call ° °

## 2018-05-08 NOTE — Telephone Encounter (Signed)
Called pt and was able to leave a vm to return call.

## 2018-06-02 ENCOUNTER — Ambulatory Visit: Payer: Commercial Managed Care - PPO | Admitting: Obstetrics and Gynecology

## 2018-06-02 ENCOUNTER — Telehealth: Payer: Self-pay

## 2018-06-02 ENCOUNTER — Encounter: Payer: Self-pay | Admitting: Obstetrics and Gynecology

## 2018-06-02 ENCOUNTER — Ambulatory Visit (INDEPENDENT_AMBULATORY_CARE_PROVIDER_SITE_OTHER): Payer: Commercial Managed Care - PPO | Admitting: Obstetrics and Gynecology

## 2018-06-02 VITALS — BP 118/74 | HR 65 | Ht 67.0 in | Wt 149.0 lb

## 2018-06-02 DIAGNOSIS — N3000 Acute cystitis without hematuria: Secondary | ICD-10-CM

## 2018-06-02 LAB — POCT URINALYSIS DIPSTICK
Bilirubin, UA: NEGATIVE
Glucose, UA: NEGATIVE
KETONES UA: NEGATIVE
Nitrite, UA: NEGATIVE
Protein, UA: NEGATIVE
RBC UA: NEGATIVE
SPEC GRAV UA: 1.02 (ref 1.010–1.025)
pH, UA: 6 (ref 5.0–8.0)

## 2018-06-02 MED ORDER — NITROFURANTOIN MONOHYD MACRO 100 MG PO CAPS: 100.0000 mg | ORAL_CAPSULE | Freq: Two times a day (BID) | ORAL | 0 refills | Status: AC

## 2018-06-02 NOTE — Telephone Encounter (Signed)
Spoke w/Mallory Craig who states patient has already made apt to be seen today.

## 2018-06-02 NOTE — Patient Instructions (Signed)
I value your feedback and entrusting us with your care. If you get a [facility] patient survey, I would appreciate you taking the time to let us know about your experience today. Thank you! 

## 2018-06-02 NOTE — Progress Notes (Signed)
Patient, No Pcp Per   Chief Complaint  Patient presents with  . Urinary Tract Infection    back hurts, bladder feels inflammed and with pressure, painful sensation when urinates, no odor or blood when wipes since yesterday    HPI:      Mallory Craig is a 27 y.o. [id number] who LMP was Patient's last menstrual period was 05/03/2018 (approximate)., presents today for UTI sx of urinary frequency, urgency, dysuria, LBP, chills (no fever when checked), and suprapubic pressure for the past 2 days. No hematuria. No vag sx except mild itch. Hx of UTIs in past, last one probably summer 2019.    Past Medical History:  Diagnosis Date  . Allergy    Claritin  . Anxiety   . Asthma    Childhood; last Albuterol use age 49  . 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 ADENOIDECTOMY    . WISDOM TOOTH EXTRACTION      Family History  Problem Relation Age of Onset  . Cancer Mother        skin cancer  . Arthritis Mother   . Hyperthyroidism Mother   . Cancer Maternal Grandmother        Lung  . Cancer Maternal Grandfather   . Asthma Brother     Social History   Socioeconomic History  . Marital status: Single    Spouse name: Not on file  . Number of children: Not on file  . Years of education: Not on file  . Highest education level: Not on file  Occupational History  . Not on file  Social Needs  . Financial resource strain: Not on file  . Food insecurity:    Worry: Not on file    Inability: Not on file  . Transportation needs:    Medical: Not on file    Non-medical: Not on file  Tobacco Use  . Smoking status: Never Smoker  . Smokeless tobacco: Never Used  Substance and Sexual Activity  . Alcohol use: No    Alcohol/week: 0.0 standard drinks  . Drug use: No  . Sexual activity: Yes    Birth control/protection: None    Comment: trying to conceive  Lifestyle  . Physical activity:    Days per week:  Not on file    Minutes per session: Not on file  . Stress: Not on file  Relationships  . Social connections:    Talks on phone: Not on file    Gets together: Not on file    Attends religious service: Not on file    Active member of club or organization: Not on file    Attends meetings of clubs or organizations: Not on file    Relationship status: Not on file  . Intimate partner violence:    Fear of current or ex partner: Not on file    Emotionally abused: Not on file    Physically abused: Not on file    Forced sexual activity: Not on file  Other Topics Concern  . Not on file  Social History Narrative   Marital status: married x 4 years.  Happily married; no abuse      Children:  2 children (2, 1)      Lives: with husband, 2 children.      Employment:  Homemaker      Tobacco: none      Alcohol: 2 drinks per week  Drugs:  None      Exercise:  No formal exercise.      Outpatient Medications Prior to Visit  Medication Sig Dispense Refill  . ibuprofen (ADVIL,MOTRIN) 200 MG tablet Take 200-800 mg by mouth every 6 (six) hours as needed for moderate pain.    . famotidine (PEPCID) 20 MG tablet Take 1 tablet (20 mg total) by mouth 2 (two) times daily. 30 tablet 0  . ondansetron (ZOFRAN ODT) 4 MG disintegrating tablet Take 1 tablet (4 mg total) by mouth every 8 (eight) hours as needed for nausea or vomiting. 10 tablet 0  . ondansetron (ZOFRAN) 4 MG tablet Take 1 tablet (4 mg total) by mouth every 6 (six) hours. 12 tablet 0   No facility-administered medications prior to visit.      ROS:  Review of Systems  Constitutional: Negative for fever.  Gastrointestinal: Negative for blood in stool, constipation, diarrhea, nausea and vomiting.  Genitourinary: Positive for dysuria, frequency and urgency. Negative for dyspareunia, flank pain, hematuria, vaginal bleeding, vaginal discharge and vaginal pain.  Musculoskeletal: Positive for back pain.  Skin: Negative for rash.    OBJECTIVE:    Vitals:  BP 118/74   Pulse 65   Ht 5\' 7"  (1.702 m)   Wt 149 lb (67.6 kg)   LMP 05/03/2018 (Approximate)   Breastfeeding? No   BMI 23.34 kg/m   Physical Exam  Constitutional: She is oriented to person, place, and time. She appears well-developed.  Neck: Normal range of motion.  Pulmonary/Chest: Effort normal.  Abdominal: There is no CVA tenderness.  Neurological: She is alert and oriented to person, place, and time. No cranial nerve deficit.  Psychiatric: She has a normal mood and affect. Her behavior is normal. Judgment and thought content normal.  Vitals reviewed.   Results: Results for orders placed or performed in visit on 06/02/18 (from the past 24 hour(s))  POCT Urinalysis Dipstick     Status: Abnormal   Collection Time: 06/02/18  4:17 PM  Result Value Ref Range   Color, UA straw    Clarity, UA clear    Glucose, UA Negative Negative   Bilirubin, UA neg    Ketones, UA neg    Spec Grav, UA 1.020 1.010 - 1.025   Blood, UA neg    pH, UA 6.0 5.0 - 8.0   Protein, UA Negative Negative   Urobilinogen, UA     Nitrite, UA neg    Leukocytes, UA Small (1+) (A) Negative   Appearance     Odor       Assessment/Plan: Acute cystitis without hematuria - Pos sx/wet prep. Rx macrobid. Check C&S. F/u prn.  - Plan: POCT Urinalysis Dipstick, Urine Culture, nitrofurantoin, macrocrystal-monohydrate, (MACROBID) 100 MG capsule    Meds ordered this encounter  Medications  . nitrofurantoin, macrocrystal-monohydrate, (MACROBID) 100 MG capsule    Sig: Take 1 capsule (100 mg total) by mouth 2 (two) times daily for 5 days.    Dispense:  10 capsule    Refill:  0    Order Specific Question:   Supervising Provider    Answer:   Nadara MustardHARRIS, ROBERT P [[id number]]      Return if symptoms worsen or fail to improve.  Choya Tornow B. Nevah Dalal, PA-C 06/02/2018 4:19 PM

## 2018-06-02 NOTE — Telephone Encounter (Signed)
Mallory Craig (husband) reports patient is having severe pain in her bladder with & w/o urination. States bladder hurts when it starts getting full. She was diagnosed with Endometriosis & they want to find out what they can do to help her out. Cb#[phone]

## 2018-06-04 LAB — URINE CULTURE

## 2018-07-02 NOTE — L&D Delivery Note (Addendum)
Operative Delivery Note At 4:28 PM a viable female was delivered via Vaginal, Vacuum Neurosurgeon).  Presentation: vertex; Position: Occiput,, Anterior; Station: +3.  Verbal consent: obtained from patient.  Risks and benefits discussed.  Risks include, but are not limited to the risks of anesthesia, bleeding, infection, damage to maternal tissues, fetal cephalhematoma.  There is also the risk of inability to effect vaginal delivery of the head, or shoulder dystocia that cannot be resolved by established maneuvers, leading to the need for emergency cesarean section.  APGAR: 8, 9; weight pending .   Placenta status: delivered spontaneously, intact.   Cord:  3VC with the following complications: none.  Cord pH: n/a  Anesthesia: epidural Instruments: flat kiwi Episiotomy: None Lacerations: 1st degree;Cervical Suture Repair: 3.0 vicryl Est. Blood Loss (mL): 200  Mom to postpartum.  Baby to Couplet care / Skin to Skin.  Called to see patient.  Good maternal effort in moving the baby. In assessing fetal position, I noted the fetal mouth just inside the vagina and anterior.  I was able to place a hand in the vagina and grasp the vertex, flex the neck and rotate the neck until the fetus was in the OA position.  The fetal heart rate would intermittently drop to the 80's. However, it did drop to the 50's at one point.  The posterior fontenelle was located. I discussed the need to help the patient affect a quick delivery.  The flat kiwi was placed over the posterior fontenelle and after insuring no maternal tissue was trapped within the vacuum, suction was applied at the beginning of the next contraction. With a single push the fetal head was protruding from the vagina. The pressure was released from the vacuum and the delivery was affected with the next push.  The vacuum was used for a total of about 8 seconds with a single push.   Mom then pushed to deliver a viable female infant.  The head followed by shoulders,  which delivered without difficulty, and the rest of the body.  A single nuchal cord noted and was reduced.  Baby to mom's chest.  Cord clamped and cut after > 1 min delay.  No cord blood obtained.  Placenta delivered spontaneously, intact, with a 3-vessel cord.  A first degree small cervical laceration was noted and a single interrupted stitch using 3-0 Vicryl was thrown and hemostasis was obtained.  The rest of the perineum, vagina, and cervix were inspected and were intact.  All counts correct.  Hemostasis obtained with IV pitocin and fundal massage. EBL 250 mL.     Prentice Docker 05/17/2019, 4:52 PM

## 2018-11-06 ENCOUNTER — Encounter: Payer: Self-pay | Admitting: Advanced Practice Midwife

## 2018-11-06 ENCOUNTER — Other Ambulatory Visit: Payer: Self-pay

## 2018-11-06 ENCOUNTER — Ambulatory Visit (INDEPENDENT_AMBULATORY_CARE_PROVIDER_SITE_OTHER): Payer: Medicaid Other | Admitting: Advanced Practice Midwife

## 2018-11-06 DIAGNOSIS — Z3A10 10 weeks gestation of pregnancy: Secondary | ICD-10-CM

## 2018-11-06 DIAGNOSIS — Z3481 Encounter for supervision of other normal pregnancy, first trimester: Secondary | ICD-10-CM

## 2018-11-06 DIAGNOSIS — Z3480 Encounter for supervision of other normal pregnancy, unspecified trimester: Secondary | ICD-10-CM

## 2018-11-06 NOTE — Progress Notes (Signed)
NOB- having a lot of nausea and vomiting at night, cannot keep food down at all

## 2018-11-06 NOTE — Progress Notes (Signed)
11/06/2018   Virtual Visit via Telephone Note  I connected with@ on 11/06/18 at  8:30 AM EDT by telephone and verified that I am speaking with the correct person using two identifiers.   I discussed the limitations, risks, security and privacy concerns of performing an evaluation and management service by telephone and the availability of in person appointments. I also discussed with the patient that there may be a patient responsible charge related to this service. The patient expressed understanding and agreed to proceed.  The patient was located at her home I spoke with the patient from my  Office phone The names of people involved in this encounter were: the patient Mallory Craig and myself Tresea Mall, CNM   Chief Complaint: Missed period  Transfer of Care Patient: no  History of Present Illness: Mallory Craig is a 28 y.o. [id number] [redacted]w[redacted]d based on Patient's last menstrual period was 08/24/2018 (exact date). with an Estimated Date of Delivery: 05/31/19, with the above CC.   Her periods were: regular periods every 28 days She was using no method when she conceived.  She has Positive signs or symptoms of nausea/vomiting of pregnancy. She has Negative signs or symptoms of miscarriage or preterm labor She was not taking different medications around the time she conceived/early pregnancy. Since her LMP, she has not used alcohol Since her LMP, she has not used tobacco products Since her LMP, she has not used illegal drugs.   She claims she has gained   7 pounds since the start of her pregnancy.  Current or past history of domestic violence. no  Infection History:  1. Since her LMP, she has not had a viral illness.  2. She admits to close contact with children on a regular basis   3. She admits to a history of chicken pox, or vaccination for chicken pox in the past. 4. Patient or partner has history of genital herpes  no 5. History of STI (GC, CT, HPV, syphilis, HIV)  no  6.  She does  not live with someone with TB or TB exposed. 7. History of recent travel :  no 8. She identifies Negative Zika risk factors for her and her partner 19. There are cats in the home in the home  no  She understands that while pregnant she should not change cat litter.   Genetic Screening Questions: (Includes patient, baby's father, or anyone in either family)   1. Patient's age >/= 76 at [facility]  no 2. Thalassemia ([place], [place], Mediterranean, or Asian background): MCV<80  no 3. Neural tube defect (meningomyelocele, spina bifida, anencephaly)  no 4. Congenital heart defect  no  5. Down syndrome  no 6. Tay-Sachs (Jewish, [place]  no 7. Canavan's Disease  no 8. Sickle cell disease or trait (African)  no  9. Hemophilia or other blood disorders  no  10. Muscular dystrophy  no  11. Cystic fibrosis  no  12. Huntington's Chorea  no  13. Mental retardation/autism  no 14. Other inherited genetic or chromosomal disorder  no 15. Maternal metabolic disorder (DM, PKU, etc)  no 16. Patient or FOB with a child with a birth defect not listed above no  16a. Patient or FOB with a birth defect themselves no 17. Recurrent pregnancy loss, or stillbirth  no  18. Any medications since LMP other than prenatal vitamins (include vitamins, supplements, OTC meds, drugs, alcohol)  no 19. Any other genetic/environmental exposure to discuss  no  ROS:  ROS  OBGYN History:  As per HPI. OB History  Gravida Para Term Preterm AB Living  5 4 3 1   3   SAB TAB Ectopic Multiple Live Births        0 3    # Outcome Date GA Lbr Len/2nd Weight Sex Delivery Anes PTL Lv  5 Current           4 Term 06/22/16 [redacted]w[redacted]d 221:51 / 00:33 7 lb 10.8 oz (3.48 kg) F Vag-Spont EPI  LIV     Birth Comments: none observed at delivery  3 Preterm 07/05/15 [redacted]w[redacted]d / 01:46 1 lb 13 oz (0.822 kg) M Vag-Spont EPI  FD  2 Term 09/02/13 [redacted]w[redacted]d  7 lb 7 oz (3.374 kg) F Vag-Spont None N LIV  1 Term 01/23/12 [redacted]w[redacted]d  6 lb 6 oz (2.892 kg) F Vag-Spont  EPI N LIV    Any issues with any prior pregnancies: G1 FT SVD 6# 6oz, G2 FT SVD 7# 7oz, G3 PT fetal demise at 26 weeks, G4 FT SVD 7#10 oz, G5 current Any prior children are healthy, doing well, without any problems or issues: no History of pap smears: No. Last pap smear 4 years ago normal History of STIs: No   Past Medical History: Past Medical History:  Diagnosis Date  . Allergy    Claritin  . Anxiety   . Asthma    Childhood; last Albuterol use age 42  . 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 ADENOIDECTOMY    . WISDOM TOOTH EXTRACTION      Family History:  Family History  Problem Relation Age of Onset  . Cancer Mother        skin cancer  . Arthritis Mother   . Hyperthyroidism Mother   . Cancer Maternal Grandmother        Lung  . Cancer Maternal Grandfather   . Asthma Brother    She denies any female cancers, bleeding or blood clotting disorders.  She denies any history of mental retardation, birth defects or genetic disorders in her or the FOB's history  Social History:  Social History   Socioeconomic History  . Marital status: Single    Spouse name: Not on file  . Number of children: Not on file  . Years of education: Not on file  . Highest education level: Not on file  Occupational History  . Not on file  Social Needs  . Financial resource strain: Not on file  . Food insecurity:    Worry: Not on file    Inability: Not on file  . Transportation needs:    Medical: Not on file    Non-medical: Not on file  Tobacco Use  . Smoking status: Never Smoker  . Smokeless tobacco: Never Used  Substance and Sexual Activity  . Alcohol use: No    Alcohol/week: 0.0 standard drinks  . Drug use: No  . Sexual activity: Yes    Partners: Male    Birth control/protection: None    Comment: trying to conceive  Lifestyle  . Physical activity:    Days per week: Not on file     Minutes per session: Not on file  . Stress: Not on file  Relationships  . Social connections:    Talks on phone: Not on file    Gets together: Not on file    Attends religious service: Not on file    Active member of  club or organization: Not on file    Attends meetings of clubs or organizations: Not on file    Relationship status: Not on file  . Intimate partner violence:    Fear of current or ex partner: Not on file    Emotionally abused: Not on file    Physically abused: Not on file    Forced sexual activity: Not on file  Other Topics Concern  . Not on file  Social History Narrative   Marital status: married x 4 years.  Happily married; no abuse      Children:  2 children (2, 1)      Lives: with husband, 2 children.      Employment:  Homemaker      Tobacco: none      Alcohol: 2 drinks per week      Drugs:  None      Exercise:  No formal exercise.      Allergy: Allergies  Allergen Reactions  . Tape Hives    Had an epidural and had a piece of tape used and caused hives.  . Bee Venom Swelling    Current Outpatient Medications: No current outpatient medications on file.   Physical Exam: Physical Exam could not be performed. Because of the COVID-19 outbreak this visit was performed over the phone and not in person.    Assessment: Mallory Craig is a 28 y.o. [id number] based on Patient's last menstrual period was 08/24/2018 (exact date). with an Estimated Date of Delivery: 05/31/19,  for prenatal care.  Plan:  1) Avoid alcoholic beverages. 2) Patient encouraged not to smoke.  3) Discontinue the use of all non-medicinal drugs and chemicals.  4) Take prenatal vitamins daily.  5) Seatbelt use advised 6) Nutrition, food safety (fish, cheese advisories, and high nitrite foods) and exercise discussed. 7) [facility]  8) Patient is asked about travel to areas at risk for the Zika virus, and counseled to avoid travel and  exposure to mosquitoes or sexual partners who may have themselves been exposed to the virus. Testing is discussed, and will be ordered as appropriate.  9) [facility] advised 10) Genetic Screening, such as with 1st Trimester Screening, cell free fetal DNA, AFP testing, and Ultrasound, as well as with amniocentesis and CVS as appropriate, is discussed with patient. She plans to have cell free DNA genetic testing this pregnancy.   Problem list reviewed and updated.  I discussed the assessment and treatment plan with the patient. The patient was provided an opportunity to ask questions and all were answered. The patient agreed with the plan and demonstrated an understanding of the instructions.   The patient was advised to call back or seek an in-person evaluation if the symptoms worsen or if the condition fails to improve as anticipated.  I provided 25 minutes of non-face-to-face time during this encounter.  Parke PoissonJane E. Shoshana Johal, CNM [facility] 11/06/2018, 9:00 AM

## 2018-11-12 ENCOUNTER — Ambulatory Visit (INDEPENDENT_AMBULATORY_CARE_PROVIDER_SITE_OTHER): Payer: Medicaid Other

## 2018-11-12 ENCOUNTER — Other Ambulatory Visit (HOSPITAL_COMMUNITY)
Admission: RE | Admit: 2018-11-12 | Discharge: 2018-11-12 | Disposition: A | Discharge status: Home / Self Care | Payer: Medicaid Other | Source: Ambulatory Visit | Attending: Advanced Practice Midwife | Admitting: Advanced Practice Midwife

## 2018-11-12 ENCOUNTER — Encounter: Payer: Self-pay | Admitting: Advanced Practice Midwife

## 2018-11-12 ENCOUNTER — Other Ambulatory Visit: Payer: Self-pay

## 2018-11-12 ENCOUNTER — Ambulatory Visit (INDEPENDENT_AMBULATORY_CARE_PROVIDER_SITE_OTHER): Payer: Medicaid Other | Admitting: Advanced Practice Midwife

## 2018-11-12 VITALS — BP 122/70 | Wt 155.0 lb

## 2018-11-12 DIAGNOSIS — Z3A11 11 weeks gestation of pregnancy: Secondary | ICD-10-CM | POA: Diagnosis not present

## 2018-11-12 DIAGNOSIS — Z113 Encounter for screening for infections with a predominantly sexual mode of transmission: Secondary | ICD-10-CM | POA: Diagnosis not present

## 2018-11-12 DIAGNOSIS — Z3480 Encounter for supervision of other normal pregnancy, unspecified trimester: Secondary | ICD-10-CM

## 2018-11-12 DIAGNOSIS — Z3687 Encounter for antenatal screening for uncertain dates: Secondary | ICD-10-CM | POA: Diagnosis not present

## 2018-11-12 DIAGNOSIS — Z3481 Encounter for supervision of other normal pregnancy, first trimester: Secondary | ICD-10-CM

## 2018-11-12 NOTE — Progress Notes (Signed)
U/s today. No vb. No lof.  

## 2018-11-12 NOTE — Patient Instructions (Signed)
Exercise During Pregnancy For people of all ages, exercise is an important part of being healthy. Exercise improves heart and lung function and helps to maintain strength, flexibility, and a healthy body weight. Exercise also boosts energy levels and elevates mood. For most women, maintaining an exercise routine throughout pregnancy is recommended. It is only on rare occasions and with certain medical conditions or pregnancy complications that women may be asked to limit or avoid exercise during pregnancy. What are some other benefits to exercising during pregnancy? Along with maintaining strength and flexibility, exercising throughout pregnancy can help to:  Keep strength in muscles that are very important during labor and childbirth.  Decrease low back pain during pregnancy.  Decrease the risk of developing gestational diabetes mellitus (GDM).  Improve blood sugar (glucose) control for women who have GDM.  Decrease the risk of developing preeclampsia. This is a serious condition that causes high blood pressure along with other symptoms, such as swelling and headaches.  Decrease the risk of cesarean delivery.  Speed up the recovery after giving birth. How often should I exercise? Unless your health care provider gives you different instructions, you should try to exercise on most days or all days of the week. In general, try to exercise with moderate intensity for about 150 minutes per week. This can be spread out across several days, such as exercising for 30 minutes per day on 5 days of each week. You can tell that you are exercising at a moderate intensity if you have a higher heart rate and faster breathing, but you are still able to hold a conversation. What types of moderate-intensity exercise are recommended during pregnancy? There are many types of exercise that are safe for you to do during pregnancy. Unless your health care provider gives you different instructions, do a variety of  exercises that safely increase your heart and breathing (cardiopulmonary) rates and help you to build and maintain muscle strength (strength training). You should always be able to talk in full sentences while exercising during pregnancy. Some examples of exercising that is safe to do during pregnancy include:  Brisk walking or hiking.  Swimming.  Water aerobics.  Riding a stationary bike.  Strength training.  Modified yoga or Pilates. Tell your instructor that you are pregnant. Avoid overstretching and avoid lying on your back for long periods of time.  Running or jogging. Only choose this type of exercise if: ? You ran or jogged regularly before your pregnancy. ? You can run or jog and still talk in complete sentences. What types of exercise should I not do during pregnancy? Depending on your level of fitness and whether you exercised regularly before your pregnancy, you may be advised to limit vigorous-intensity exercise during your pregnancy. You can tell that you are exercising at a vigorous intensity if you are breathing much harder and faster and cannot hold a conversation while exercising. Some examples of exercising that you should avoid during pregnancy include:  Contact sports.  Activities that place you at risk for falling on or being hit in the belly, such as downhill skiing, water skiing, surfing, rock climbing, cycling, gymnastics, and horseback riding.  Scuba diving.  Sky diving.  Yoga or Pilates in a room that is heated to extreme temperatures ("hot yoga" or "hot Pilates").  Jogging or running, unless you ran or jogged regularly before your pregnancy. While jogging or running, you should always be able to talk in full sentences. Do not run or jog so vigorously that you   are unable to have a conversation.  If you are not used to exercising at elevation (more than 6,000 feet above sea level), do not do so during your pregnancy. When should I avoid exercising during  pregnancy? Certain medical conditions can make it unsafe to exercise during pregnancy, or they may increase your risk of miscarriage or early labor and birth. Some of these conditions include:  Some types of heart disease.  Some types of lung disease.  Placenta previa. This is when the placenta partially or completely covers the opening of the uterus (cervix).  Frequent bleeding from the vagina during your pregnancy.  Incompetent cervix. This is when your cervix does not remain as tightly closed during pregnancy as it should.  Premature labor.  Ruptured membranes. This is when the protective sac (amniotic sac) opens up and amniotic fluid leaks from your vagina.  Severely low blood count (anemia).  Preeclampsia or pregnancy-caused high blood pressure.  Carrying more than one baby (multiple gestation) and having an additional risk of early labor.  Poorly controlled diabetes.  Being severely underweight or severely overweight.  Intrauterine growth restriction. This is when your baby's growth and development during pregnancy are slower than expected.  Other medical conditions. Ask your health care provider if any apply to you. What else should I know about exercising during pregnancy? You should take these precautions while exercising during pregnancy:  Avoid overheating. ? Wear loose-fitting, breathable clothes. ? Do not exercise in very high temperatures.  Avoid dehydration. Drink enough water before, during, and after exercise to keep your urine clear or pale yellow.  Avoid overstretching. Because of hormone changes during pregnancy, it is easy to overstretch muscles, tendons, and ligaments during pregnancy.  Start slowly and ask your health care provider to recommend types of exercise that are safe for you, if exercising regularly is new for you. Pregnancy is not a time for exercising to lose weight. When should I seek medical care? You should stop exercising and call your  health care provider if you have any unusual symptoms, such as:  Mild uterine contractions or abdominal cramping.  Dizziness that does not improve with rest. When should I seek immediate medical care? You should stop exercising and call your local emergency services (911 in the [place] if you have any unusual symptoms, such as:  Sudden, severe pain in your low back or your belly.  Uterine contractions or abdominal cramping that do not improve with rest.  Chest pain.  Bleeding or fluid leaking from your vagina.  Shortness of breath. This information is not intended to replace advice given to you by your health care provider. Make sure you discuss any questions you have with your health care provider. Document Released: 06/18/2005 Document Revised: 11/16/2015 Document Reviewed: 08/26/2014 Elsevier Interactive Patient Education  2019 [employer] Eating Plan for Pregnant Women While you are pregnant, your body requires additional nutrition to help support your growing baby. You also have a higher need for some vitamins and minerals, such as folic acid, calcium, iron, and vitamin D. Eating a healthy, well-balanced diet is very important for your health and your baby's health. Your need for extra calories varies for the three 3-month segments of your pregnancy (trimesters). For most women, it is recommended to consume:  150 extra calories a day during the first trimester.  300 extra calories a day during the second trimester.  300 extra calories a day during the third trimester. What are tips for following this plan?   Do   not try to lose weight or go on a diet during pregnancy.  Limit your overall intake of foods that have "empty calories." These are foods that have little nutritional value, such as sweets, desserts, candies, and sugar-sweetened beverages.  Eat a variety of foods (especially fruits and vegetables) to get a full range of vitamins and minerals.  Take a prenatal vitamin  to help meet your additional vitamin and mineral needs during pregnancy, specifically for folic acid, iron, calcium, and vitamin D.  Remember to stay active. Ask your health care provider what types of exercise and activities are safe for you.  Practice good food safety and cleanliness. Wash your hands before you eat and after you prepare raw meat. Wash all fruits and vegetables well before peeling or eating. Taking these actions can help to prevent food-borne illnesses that can be very dangerous to your baby, such as listeriosis. Ask your health care provider for more information about listeriosis. What does 150 extra calories look like? Healthy options that provide 150 extra calories each day could be any of the following:  6-8 oz (170-230 g) of plain low-fat yogurt with  cup of berries.  1 apple with 2 teaspoons (11 g) of peanut butter.  Cut-up vegetables with  cup (60 g) of hummus.  8 oz (230 mL) or 1 cup of low-fat chocolate milk.  1 stick of string cheese with 1 medium orange.  1 peanut butter and jelly sandwich that is made with one slice of whole-wheat bread and 1 tsp (5 g) of peanut butter. For 300 extra calories, you could eat two of those healthy options each day. What is a healthy amount of weight to gain? The right amount of weight gain for you is based on your BMI before you became pregnant. If your BMI:  Was less than 18 (underweight), you should gain 28-40 lb (13-18 kg).  Was 18-24.9 (normal), you should gain 25-35 lb (11-16 kg).  Was 25-29.9 (overweight), you should gain 15-25 lb (7-11 kg).  Was 30 or greater (obese), you should gain 11-20 lb (5-9 kg). What if I am having twins or multiples? Generally, if you are carrying twins or multiples:  You may need to eat 300-600 extra calories a day.  The recommended range for total weight gain is 25-54 lb (11-25 kg), depending on your BMI before pregnancy.  Talk with your health care provider to find out about  nutritional needs, weight gain, and exercise that is right for you. What foods can I eat?  Grains All grains. Choose whole grains, such as whole-wheat bread, oatmeal, or brown rice. Vegetables All vegetables. Eat a variety of colors and types of vegetables. Remember to wash your vegetables well before peeling or eating. Fruits All fruits. Eat a variety of colors and types of fruit. Remember to wash your fruits well before peeling or eating. Meats and other protein foods Lean meats, including chicken, turkey, fish, and lean cuts of beef, veal, or pork. If you eat fish or seafood, choose options that are higher in omega-3 fatty acids and lower in mercury, such as salmon, herring, mussels, trout, sardines, pollock, shrimp, crab, and lobster. Tofu. Tempeh. Beans. Eggs. Peanut butter and other nut butters. Make sure that all meats, poultry, and eggs are cooked to food-safe temperatures or "well-done." Two or more servings of fish are recommended each week in order to get the most benefits from omega-3 fatty acids that are found in seafood. Choose fish that are lower in mercury. You can   find more information online:  [employer] Dairy Pasteurized milk and milk alternatives (such as almond milk). Pasteurized yogurt and pasteurized cheese. Cottage cheese. Sour cream. Beverages Water. Juices that contain 100% fruit juice or vegetable juice. Caffeine-free teas and decaffeinated coffee. Drinks that contain caffeine are okay to drink, but it is better to avoid caffeine. Keep your total caffeine intake to less than 200 mg each day (which is 12 oz or 355 mL of coffee, tea, or soda) or the limit as told by your health care provider. Fats and oils Fats and oils are okay to include in moderation. Sweets and desserts Sweets and desserts are okay to include in moderation. Seasoning and other foods All pasteurized condiments. The items listed above may not be a complete list of recommended foods and beverages.  Contact your dietitian for more options. What foods are not recommended? Vegetables Raw (unpasteurized) vegetable juices. Fruits Unpasteurized fruit juices. Meats and other protein foods Lunch meats, bologna, hot dogs, or other deli meats. (If you must eat those meats, reheat them until they are steaming hot.) Refrigerated pat, meat spreads from a meat counter, smoked seafood that is found in the refrigerated section of a store. Raw or undercooked meats, poultry, and eggs. Raw fish, such as sushi or sashimi. Fish that have high mercury content, such as tilefish, shark, swordfish, and king mackerel. To learn more about mercury in fish, talk with your health care provider or look for online resources, such as:  [employer] Dairy Raw (unpasteurized) milk and any foods that have raw milk in them. Soft cheeses, such as feta, queso blanco, queso fresco, Brie, Camembert cheeses, blue-veined cheeses, and Panela cheese (unless it is made with pasteurized milk, which must be stated on the label). Beverages Alcohol. Sugar-sweetened beverages, such as sodas, teas, or energy drinks. Seasoning and other foods Homemade fermented foods and drinks, such as pickles, sauerkraut, or kombucha drinks. (Store-bought pasteurized versions of these are okay.) Salads that are made in a store or deli, such as ham salad, chicken salad, egg salad, tuna salad, and seafood salad. The items listed above may not be a complete list of foods and beverages to avoid. Contact your dietitian for more information. Where to find more information To calculate the number of calories you need based on your height, weight, and activity level, you can use an online calculator such as:  MobileTransition.ch To calculate how much weight you should gain during pregnancy, you can use an online pregnancy weight gain calculator such as:  Streaming[employer].com.cy Summary  While you are pregnant,  your body requires additional nutrition to help support your growing baby.  Eat a variety of foods, especially fruits and vegetables to get a full range of vitamins and minerals.  Practice good food safety and cleanliness. Wash your hands before you eat and after you prepare raw meat. Wash all fruits and vegetables well before peeling or eating. Taking these actions can help to prevent food-borne illnesses, such as listeriosis, that can be very dangerous to your baby.  Do not eat raw meat or fish. Do not eat fish that have high mercury content, such as tilefish, shark, swordfish, and king mackerel. Do not eat unpasteurized (raw) dairy.  Take a prenatal vitamin to help meet your additional vitamin and mineral needs during pregnancy, specifically for folic acid, iron, calcium, and vitamin D. This information is not intended to replace advice given to you by your health care provider. Make sure you discuss any questions you have with your health care  provider. Document Released: 04/02/2014 Document Revised: 03/15/2017 Document Reviewed: 03/15/2017 Elsevier Interactive Patient Education  2019 Elsevier Inc. Prenatal Care Prenatal care is health care during pregnancy. It helps you and your unborn baby (fetus) stay as healthy as possible. Prenatal care may be provided by a midwife, a family practice health care provider, or a childbirth and pregnancy specialist (obstetrician). How does this affect me? During pregnancy, you will be closely monitored for any new conditions that might develop. To lower your risk of pregnancy complications, you and your health care provider will talk about any underlying conditions you have. How does this affect my baby? Early and consistent prenatal care increases the chance that your baby will be healthy during pregnancy. Prenatal care lowers the risk that your baby will be:  Born early (prematurely).  Smaller than expected at birth (small for gestational age). What  can I expect at the first prenatal care visit? Your first prenatal care visit will likely be the longest. You should schedule your first prenatal care visit as soon as you know that you are pregnant. Your first visit is a good time to talk about any questions or concerns you have about pregnancy. At your visit, you and your health care provider will talk about:  Your medical history, including: ? Any past pregnancies. ? Your family's medical history. ? The baby's father's medical history. ? Any long-term (chronic) health conditions you have and how you manage them. ? Any surgeries or procedures you have had. ? Any current over-the-counter or prescription medicines, herbs, or supplements you are taking.  Other factors that could pose a risk to your baby, including:  Your home setting and your stress levels, including: ? Exposure to abuse or violence. ? Household financial strain. ? Mental health conditions you have.  Your daily health habits, including diet and exercise. Your health care provider will also:  Measure your weight, height, and blood pressure.  Do a physical exam, including a pelvic and breast exam.  Perform blood tests and urine tests to check for: ? Urinary tract infection. ? Sexually transmitted infections (STIs). ? Low iron levels in your blood (anemia). ? Blood type and certain proteins on red blood cells (Rh antibodies). ? Infections and immunity to viruses, such as hepatitis B and rubella. ? HIV (human immunodeficiency virus).  Do an ultrasound to confirm your baby's growth and development and to help predict your estimated due date (EDD). This ultrasound is done with a probe that is inserted into the vagina (transvaginal ultrasound).  Discuss your options for genetic screening.  Give you information about how to keep yourself and your baby healthy, including: ? Nutrition and taking vitamins. ? Physical activity. ? How to manage pregnancy symptoms such as  nausea and vomiting (morning sickness). ? Infections and substances that may be harmful to your baby and how to avoid them. ? Food safety. ? Dental care. ? Working. ? Travel. ? Warning signs to watch for and when to call your health care provider. How often will I have prenatal care visits? After your first prenatal care visit, you will have regular visits throughout your pregnancy. The visit schedule is often as follows:  Up to week 28 of pregnancy: once every 4 weeks.  28-36 weeks: once every 2 weeks.  After 36 weeks: every week until delivery. Some women may have visits more or less often depending on any underlying health conditions and the health of the baby. Keep all follow-up and prenatal care visits as told by   your health care provider. This is important. What happens during routine prenatal care visits? Your health care provider will:  Measure your weight and blood pressure.  Check for fetal heart sounds.  Measure the height of your uterus in your abdomen (fundal height). This may be measured starting around week 20 of pregnancy.  Check the position of your baby inside your uterus.  Ask questions about your diet, sleeping patterns, and whether you can feel the baby move.  Review warning signs to watch for and signs of labor.  Ask about any pregnancy symptoms you are having and how you are dealing with them. Symptoms may include: ? Headaches. ? Nausea and vomiting. ? Vaginal discharge. ? Swelling. ? Fatigue. ? Constipation. ? Any discomfort, including back or pelvic pain. Make a list of questions to ask your health care provider at your routine visits. What tests might I have during prenatal care visits? You may have blood, urine, and imaging tests throughout your pregnancy, such as:  Urine tests to check for glucose, protein, or signs of infection.  Glucose tests to check for a form of diabetes that can develop during pregnancy (gestational diabetes mellitus).  This is usually done around week 24 of pregnancy.  An ultrasound to check your baby's growth and development and to check for birth defects. This is usually done around week 20 of pregnancy.  A test to check for group B strep (GBS) infection. This is usually done around week 36 of pregnancy.  Genetic testing. This may include blood or imaging tests, such as an ultrasound. Some genetic tests are done during the first trimester and some are done during the second trimester. What else can I expect during prenatal care visits? Your health care provider may recommend getting certain vaccines during pregnancy. These may include:  A yearly flu shot (annual influenza vaccine). This is especially important if you will be pregnant during flu season.  Tdap (tetanus, diphtheria, pertussis) vaccine. Getting this vaccine during pregnancy can protect your baby from whooping cough (pertussis) after birth. This vaccine may be recommended between weeks 27 and 36 of pregnancy. Later in your pregnancy, your health care provider may give you information about:  Childbirth and breastfeeding classes.  Choosing a health care provider for your baby.  Umbilical cord banking.  Breastfeeding.  Birth control after your baby is born.  The hospital labor and delivery unit and how to tour it.  Registering at the hospital before you go into labor. Where to find more information  Office on Women's Health: [place]Less[place]awom[place]health.gov  American Pregnancy Association: americanpregnancy.org  March of Dimes: marchofdimes.org Summary  Prenatal care helps you and your baby stay as healthy as possible during pregnancy.  Your first prenatal care visit will most likely be the longest.  You will have visits and tests throughout your pregnancy to monitor your health and your baby's health.  Bring a list of questions to your visits to ask your health care provider.  Make sure to keep all follow-up and prenatal care visits with  your health care provider. This information is not intended to replace advice given to you by your health care provider. Make sure you discuss any questions you have with your health care provider. Document Released: 06/21/2003 Document Revised: 06/17/2017 Document Reviewed: 06/17/2017 Elsevier Interactive Patient Education  2019 [employer]    COVID-19 and Your Pregnancy FAQ  How can I prevent infection with COVID-19 during my pregnancy? Social distancing is key. Please limit any interactions in public. Try  and work from home if possible. Frequently wash your hands after touching possibly contaminated surfaces. Avoid touching your face.  Minimize trips to the store. Consider online ordering when possible.   Should I wear a mask? YES. It is recommended by the CDC that all people wear a cloth mask or facial covering in public. You should wear a mask to your visits in the office. This will help reduce transmission as well as your risk or acquiring COVID-19. New studies are showing that even asymptomatic individuals can spread the virus from talking.   Where can I get a mask? [place] and the city of [place] are partnering to provide masks to community members. You can pick up a mask from several locations. This website also has instructions about how to make a mask by sewing or without sewing by using a t-shirt or bandana.  https://www.[place].gov/i-want-to/learn-about/covid-19-information-and-updates/covid-19-face-mask-project  Studies have shown that if you were a tube or nylon stocking from pantyhose over a cloth mask it makes the cloth mask almost as effective as a N95 mask.  https://www.npr.org/sections/goatsandsoda/2018/10/22/840146830/adding-a-nylon-stocking-layer-could-boost-protection-from-cloth-masks-study-find  What are the symptoms of COVID-19? Fever (greater than 100.4 F), dry cough, shortness of breath.  Am I more at risk for COVID-19 since I am pregnant? There  is not currently data showing that pregnant women are more adversely impacted by COVID-19 than the general population. However, we know that pregnant women tend to have worse respiratory complications from similar diseases such as the flu and SARS and for this reason should be considered an at-risk population.  What do I do if I am experiencing the symptoms of COVID-19? Testing is being limited because of test availability. If you are experiencing symptoms you should quarantine yourself, and the members of your family, for at least 2 weeks at home.   Please visit this website for more information: https://www.cdc.gov/coronavirus/2019-ncov/if-you-are-sick/steps-when-sick.html  When should I go to the Emergency Room? Please go to the emergency room if you are experiencing ANY of these symptoms*:  1.    Difficulty breathing or shortness of breath 2.    Persistent pain or pressure in the chest 3.    Confusion or difficulty being aroused (or awakened) 4.    Bluish lips or face  *This list is not all inclusive. Please consult our office for any other symptoms that are severe or concerning.  What do I do if I am having difficulty breathing? You should go to the Emergency Room for evaluation. At this time they have a tent set up for evaluating patients with COVID-19 symptoms.   How will my prenatal care be different because of the COVID-19 pandemic? It has been recommended to reduce the frequency of face-to-face visits and use resources such as telephone and virtual visits when possible. Using a scale, blood pressure machine and fetal doppler at home can further help reduce face-to-face visits. You will be provided with additional information on this topic.  We ask that you come to your visits alone to minimize potential exposures to  COVID-19.  How can I receive childbirth education? At this time in-person classes have been cancelled. You can register for online childbirth education, breastfeeding,  and newborn care classes.  Please visit:  [email or web address] for more information  How will my hospital birth experience be different? The hospital is currently limiting visitors. This means that while you are in labor you can only have one person at the hospital with you. Additional family members will not be allowed to wait in the building or outside your   room. Your one support person can be the father of the baby, a relative, a doula, or a friend. Once one support person is designated that person will wear a band. This band cannot be shared with multiple people.  Nitrous Gas is not being offered for pain relief since the tubing and filter for the machine can not be sanitized in a way to guarantee prevention of transmission of COVID-19.  Nasal cannula use of oxygen for fetal indications has also been discontinued.  Currently a clear plastic sheet is being hung between mom and the delivering provider during pushing and delivery to help prevent transmission of COVID-19.      How long will I stay in the hospital for after giving birth? It is also recommended that discharge home be expedited during the COVID-19 outbreak. This means staying for 1 day after a vaginal delivery and 2 days after a cesarean section. Patients who need to stay longer for medical reasons are allowed to do so, but the goal will be for expedited discharge home.   What if I have COVID-19 and I am in labor? We ask that you wear a mask while on labor and delivery. We will try and accommodate you being placed in a room that is capable of filtering the air. Please call ahead if you are in labor and on your way to the hospital. The phone number for labor and delivery at [facility] is [phone].  If I have COVID-19 when my baby is born how can I prevent my baby from contracting COVID-19? This is an issue that will have to be discussed on a case-by-case basis. Current recommendations suggest  providing separate isolation rooms for both the mother and new infant as well as limiting visitors. However, there are practical challenges to this recommendation. The situation will assuredly change and decisions will be influenced by the desires of the mother and availability of space.  Some suggestions are the use of a curtain or physical barrier between mom and infant, hand hygiene, mom wearing a mask, or 6 feet of spacing between a mom and infant.   Can I breastfeed during the COVID-19 pandemic?   Yes, breastfeeding is encouraged.  Can I breastfeed if I have COVID-19? Yes. Covid-19 has not been found in breast milk. This means you cannot give COVID-19 to your child through breast milk. Breast feeding will also help pass antibodies to fight infection to your baby.   What precautions should I take when breastfeeding if I have COVID-19? If a mother and newborn do room-in and the mother wishes to feed at the breast, she should put on a facemask and practice hand hygiene before each feeding.  What precautions should I take when pumping if I have COVID-19? Prior to expressing breast milk, mothers should practice hand hygiene. After each pumping session, all parts that come into contact with breast milk should be thoroughly washed and the entire pump should be appropriately disinfected per the manufacturer's instructions. This expressed breast milk should be fed to the newborn by a healthy caregiver.  What if I am pregnant and work in healthcare? Based on limited data regarding COVID-19 and pregnancy, ACOG currently does not propose creating additional restrictions on pregnant health care personnel because of COVID-19 alone. Pregnant women do not appear to be at higher risk of severe disease related to COVID-19. Pregnant health care personnel should follow CDC risk assessment and infection control guidelines for health care personnel exposed to patients with  suspected or confirmed COVID-19. Adherence to  recommended infection prevention and control practices is an important part of protecting all health care personnel in health care settings.    Information on COVID-19 in pregnancy is very limited; however, facilities may want to consider limiting exposure of pregnant health care personnel to patients with confirmed or suspected COVID-19 infection, especially during higher-risk procedures (eg, aerosol-generating procedures), if feasible, based on staffing availability.

## 2018-11-12 NOTE — Progress Notes (Signed)
  Routine Prenatal Care Visit  Subjective  Mallory Craig is a 28 y.o. [phone] at [redacted]w[redacted]d being seen today for ongoing prenatal care.  She is currently monitored for the following issues for this low-risk pregnancy and has Anxiety and depression; Supervision of other normal pregnancy, antepartum; and Family history of genetic disorder on their problem list.  ----------------------------------------------------------------------------------- Patient reports nausea with vomiting most days. She will try OTC Unisom/B6 first.    . Vag. Bleeding: None.   . Denies leaking of fluid.  ----------------------------------------------------------------------------------- The following portions of the patient's history were reviewed and updated as appropriate: allergies, current medications, past family history, past medical history, past social history, past surgical history and problem list. Problem list updated.   Objective  Blood pressure 122/70, weight 155 lb (70.3 kg), last menstrual period 08/24/2018. Pregravid weight 151 lb (68.5 kg) Total Weight Gain 4 lb (1.814 kg) Urinalysis: Urine Protein    Urine Glucose    Fetal Status: Fetal Heart Rate (bpm): 155         General:  Alert, oriented and cooperative. Patient is in no acute distress.  Skin: Skin is warm and dry. No rash noted.   Cardiovascular: Normal heart rate noted  Respiratory: Normal respiratory effort, no problems with respiration noted  Abdomen: Soft, gravid, appropriate for gestational age. Pain/Pressure: Absent     Pelvic:  Cervical exam deferred        Extremities: Normal range of motion.     Mental Status: Normal mood and affect. Normal behavior. Normal judgment and thought content.   Assessment   27 y.o. [id number] at [redacted]w[redacted]d by  05/31/2019, by Last Menstrual Period presenting for routine prenatal visit  Plan   FIFTH Problems (from 11/06/18 to present)    No problems associated with this episode.       Preterm labor symptoms  and general obstetric precautions including but not limited to vaginal bleeding, contractions, leaking of fluid and fetal movement were reviewed in detail with the patient. Please refer to After Visit Summary for other counseling recommendations.   OTC Unisom/B6 TID PRN Sea Bands Follow up as needed before next visit  Return in about 8 weeks (around 01/07/2019) for anatomy and rob.  Tresea Mall, CNM 11/12/2018 3:55 PM

## 2018-11-13 LAB — RPR+RH+ABO+RUB AB+AB SCR+CB...
Antibody Screen: NEGATIVE
HIV Screen 4th Generation wRfx: NONREACTIVE
Hematocrit: 39.5 % (ref 34.0–46.6)
Hemoglobin: 13.6 g/dL (ref 11.1–15.9)
Hepatitis B Surface Ag: NEGATIVE
MCH: 30.1 pg (ref 26.6–33.0)
MCHC: 34.4 g/dL (ref 31.5–35.7)
MCV: 87 fL (ref 79–97)
Platelets: 184 10*3/uL (ref 150–450)
RBC: 4.52 x10E6/uL (ref 3.77–5.28)
RDW: 13.6 % (ref 11.7–15.4)
RPR Ser Ql: NONREACTIVE
Rh Factor: POSITIVE
Rubella Antibodies, IGG: 1.53 index (ref >0.99)
Varicella zoster IgG: <135 index — ABNORMAL LOW (ref >165)
WBC: 9.2 10*3/uL (ref 3.4–10.8)

## 2018-11-14 LAB — CERVICOVAGINAL ANCILLARY ONLY
Chlamydia: NEGATIVE
Neisseria Gonorrhea: NEGATIVE
Trichomonas: NEGATIVE

## 2018-11-18 LAB — MATERNIT 21 PLUS CORE, BLOOD
Fetal Fraction: 11
Result (T21): NEGATIVE
Trisomy 13 (Patau syndrome): NEGATIVE
Trisomy 18 (Edwards syndrome): NEGATIVE
Trisomy 21 (Down syndrome): NEGATIVE

## 2018-11-21 ENCOUNTER — Other Ambulatory Visit: Payer: Self-pay | Admitting: Maternal Newborn

## 2018-11-21 DIAGNOSIS — O219 Vomiting of pregnancy, unspecified: Secondary | ICD-10-CM

## 2018-11-21 MED ORDER — PROMETHAZINE HCL 25 MG PO TABS: 25.0000 mg | ORAL_TABLET | Freq: Four times a day (QID) | ORAL | 2 refills | Status: DC | PRN

## 2018-11-21 NOTE — Progress Notes (Signed)
Rx for Phenergan sent.

## 2019-01-07 ENCOUNTER — Encounter: Payer: Self-pay | Admitting: Maternal Newborn

## 2019-01-07 ENCOUNTER — Other Ambulatory Visit: Payer: Self-pay

## 2019-01-07 ENCOUNTER — Ambulatory Visit (INDEPENDENT_AMBULATORY_CARE_PROVIDER_SITE_OTHER): Payer: Medicaid Other

## 2019-01-07 ENCOUNTER — Ambulatory Visit (INDEPENDENT_AMBULATORY_CARE_PROVIDER_SITE_OTHER): Payer: Medicaid Other | Admitting: Maternal Newborn

## 2019-01-07 VITALS — BP 104/64 | Wt 162.4 lb

## 2019-01-07 DIAGNOSIS — Z3480 Encounter for supervision of other normal pregnancy, unspecified trimester: Secondary | ICD-10-CM

## 2019-01-07 DIAGNOSIS — Z3482 Encounter for supervision of other normal pregnancy, second trimester: Secondary | ICD-10-CM

## 2019-01-07 DIAGNOSIS — Z3A19 19 weeks gestation of pregnancy: Secondary | ICD-10-CM

## 2019-01-07 DIAGNOSIS — Z363 Encounter for antenatal screening for malformations: Secondary | ICD-10-CM | POA: Diagnosis not present

## 2019-01-07 LAB — POCT URINALYSIS DIPSTICK OB
Glucose, UA: NEGATIVE
POC,PROTEIN,UA: NEGATIVE

## 2019-01-07 NOTE — Progress Notes (Signed)
    Routine Prenatal Care Visit  Subjective  Mallory Craig is a 28 y.o. [phone] at [redacted]w[redacted]d being seen today for ongoing prenatal care.  She is currently monitored for the following issues for this low-risk pregnancy and has Anxiety and depression; Supervision of other normal pregnancy, antepartum; and Family history of genetic disorder on their problem list.  ----------------------------------------------------------------------------------- Patient reports ongoing nausea but has improved a lot. Still having some round ligament pain. Contractions: Not present. Vag. Bleeding: None.  Movement: Present. No leaking of fluid.  ----------------------------------------------------------------------------------- The following portions of the patient's history were reviewed and updated as appropriate: allergies, current medications, past family history, past medical history, past social history, past surgical history and problem list. Problem list updated.   Objective  Blood pressure 104/64, weight 162 lb 6.4 oz (73.7 kg), last menstrual period 08/24/2018. Pregravid weight 151 lb (68.5 kg) Total Weight Gain 11 lb 6.4 oz (5.171 kg) Urinalysis: Urine dipstick shows negative for glucose, protein.  Fetal Status: Fetal Heart Rate (bpm): 142   Movement: Present     General:  Alert, oriented and cooperative. Patient is in no acute distress.  Skin: Skin is warm and dry. No rash noted.   Cardiovascular: Normal heart rate noted  Respiratory: Normal respiratory effort, no problems with respiration noted  Abdomen: Soft, gravid, appropriate for gestational age. Pain/Pressure: Absent     Pelvic:  Cervical exam deferred        Extremities: Normal range of motion.     Mental Status: Normal mood and affect. Normal behavior. Normal judgment and thought content.     Assessment   28 y.o. [name] at [redacted]w[redacted]d, EDD 05/31/2019 by Last Menstrual Period presenting for a routine prenatal visit.  Plan   FIFTH Problems  (from 11/06/18 to present)    No problems associated with this episode.    Anatomy scan is complete and normal today.  Please refer to After Visit Summary for other counseling recommendations.   Return in about 4 weeks (around 02/04/2019) for ROB.  Avel Sensor, CNM 01/07/2019  2:52 PM

## 2019-01-07 NOTE — Progress Notes (Signed)
ROB/Anatomy scan- no concerns 

## 2019-01-07 NOTE — Patient Instructions (Signed)

## 2019-02-04 ENCOUNTER — Other Ambulatory Visit: Payer: Self-pay

## 2019-02-04 ENCOUNTER — Encounter: Payer: Medicaid Other | Admitting: Obstetrics and Gynecology

## 2019-02-04 ENCOUNTER — Ambulatory Visit (INDEPENDENT_AMBULATORY_CARE_PROVIDER_SITE_OTHER): Payer: Medicaid Other | Admitting: Obstetrics and Gynecology

## 2019-02-04 VITALS — BP 120/80 | Wt 166.0 lb

## 2019-02-04 DIAGNOSIS — Z3A23 23 weeks gestation of pregnancy: Secondary | ICD-10-CM

## 2019-02-04 DIAGNOSIS — Z3480 Encounter for supervision of other normal pregnancy, unspecified trimester: Secondary | ICD-10-CM

## 2019-02-04 DIAGNOSIS — Z3482 Encounter for supervision of other normal pregnancy, second trimester: Secondary | ICD-10-CM

## 2019-02-04 LAB — POCT URINALYSIS DIPSTICK OB
Glucose, UA: NEGATIVE
POC,PROTEIN,UA: NEGATIVE

## 2019-02-04 NOTE — Progress Notes (Signed)
    Routine Prenatal Care Visit  Subjective  Mallory Craig is a 28 y.o. [phone] at [redacted]w[redacted]d being seen today for ongoing prenatal care.  She is currently monitored for the following issues for this low-risk pregnancy and has Anxiety and depression; Supervision of other normal pregnancy, antepartum; and Family history of genetic disorder on their problem list.  ----------------------------------------------------------------------------------- Patient reports no complaints.   Contractions: Not present. Vag. Bleeding: None.  Movement: Present. Denies leaking of fluid.  ----------------------------------------------------------------------------------- The following portions of the patient's history were reviewed and updated as appropriate: allergies, current medications, past family history, past medical history, past social history, past surgical history and problem list. Problem list updated.   Objective  Blood pressure 120/80, weight 166 lb (75.3 kg), last menstrual period 08/24/2018. Pregravid weight 151 lb (68.5 kg) Total Weight Gain 15 lb (6.804 kg) Urinalysis:      Fetal Status: Fetal Heart Rate (bpm): 140   Movement: Present     General:  Alert, oriented and cooperative. Patient is in no acute distress.  Skin: Skin is warm and dry. No rash noted.   Cardiovascular: Normal heart rate noted  Respiratory: Normal respiratory effort, no problems with respiration noted  Abdomen: Soft, gravid, appropriate for gestational age. Pain/Pressure: Present     Pelvic:  Cervical exam deferred        Extremities: Normal range of motion.     ental Status: Normal mood and affect. Normal behavior. Normal judgment and thought content.     Assessment   28 y.o. [id number] at [redacted]w[redacted]d by  05/31/2019, by Last Menstrual Period presenting for routine prenatal visit  Plan   FIFTH Problems (from 11/06/18 to present)    Problem Noted Resolved   Supervision of other normal pregnancy, antepartum 03/23/2015 by  Morene Crocker, CNM No   Overview Addendum 02/04/2019  8:42 PM by Malachy Mood, MD    Clinic [facility] Prenatal Labs  Dating LMP = 12 week [place] Blood type: B/Positive/-- (05/13 1553)   Genetic Screen NIPS:Normal XY Antibody:Negative (05/13 1553)  Anatomic [place] Normal Rubella: 1.53 (05/13 1553) Varicella: Non-Immune  GTT Early:               Third trimester:  RPR: Non Reactive (05/13 1553)   Rhogam N/A HBsAg: Negative (05/13 1553)   TDaP vaccine  Flu Shot: HIV: Non Reactive (05/13 1553)   Baby Food Breast                               GBS:   Contraception Considering BTL Pap: 03/04/2015 NIL  CBB     CS/VBAC NA   Support Person Husband Timmothy Sours              Gestational age appropriate obstetric precautions including but not limited to vaginal bleeding, contractions, leaking of fluid and fetal movement were reviewed in detail with the patient.    Return in about 4 weeks (around 03/04/2019) for ROB and 28 week labs.  Malachy Mood, MD, [facility], [facility] 02/04/2019, 8:42 PM

## 2019-03-04 ENCOUNTER — Encounter: Payer: Self-pay | Admitting: Maternal Newborn

## 2019-03-04 ENCOUNTER — Ambulatory Visit (INDEPENDENT_AMBULATORY_CARE_PROVIDER_SITE_OTHER): Payer: Medicaid Other | Admitting: Maternal Newborn

## 2019-03-04 ENCOUNTER — Other Ambulatory Visit: Payer: Medicaid Other

## 2019-03-04 ENCOUNTER — Encounter: Payer: Medicaid Other | Admitting: Maternal Newborn

## 2019-03-04 ENCOUNTER — Other Ambulatory Visit: Payer: Self-pay

## 2019-03-04 VITALS — BP 100/60 | Wt 171.4 lb

## 2019-03-04 DIAGNOSIS — Z3480 Encounter for supervision of other normal pregnancy, unspecified trimester: Secondary | ICD-10-CM

## 2019-03-04 DIAGNOSIS — Z3482 Encounter for supervision of other normal pregnancy, second trimester: Secondary | ICD-10-CM

## 2019-03-04 DIAGNOSIS — Z3A27 27 weeks gestation of pregnancy: Secondary | ICD-10-CM

## 2019-03-04 LAB — POCT URINALYSIS DIPSTICK OB
Glucose, UA: NEGATIVE
POC,PROTEIN,UA: NEGATIVE

## 2019-03-04 NOTE — Progress Notes (Signed)
    Routine Prenatal Care Visit  Subjective  Mallory Craig is a 28 y.o. [phone] at [redacted]w[redacted]d being seen today for ongoing prenatal care.  She is currently monitored for the following issues for this low-risk pregnancy and has Anxiety and depression; Supervision of other normal pregnancy, antepartum; and Family history of genetic disorder on their problem list.  ----------------------------------------------------------------------------------- Patient reports occasional leg cramps, some nausea this morning after drinking glucose solution. Contractions: Not present. Vag. Bleeding: None.  Movement: Present. No leaking of fluid.  ----------------------------------------------------------------------------------- The following portions of the patient's history were reviewed and updated as appropriate: allergies, current medications, past family history, past medical history, past social history, past surgical history and problem list. Problem list updated.   Objective  Blood pressure 100/60, weight 171 lb 6.4 oz (77.7 kg), last menstrual period 08/24/2018. Pregravid weight 151 lb (68.5 kg) Total Weight Gain 20 lb 6.4 oz (9.253 kg) Urinalysis: Urine dipstick shows negative for glucose, protein.  Fetal Status: Fetal Heart Rate (bpm): 141 Fundal Height: 28 cm Movement: Present     General:  Alert, oriented and cooperative. Patient is in no acute distress.  Skin: Skin is warm and dry. No rash noted.   Cardiovascular: Normal heart rate noted  Respiratory: Normal respiratory effort, no problems with respiration noted  Abdomen: Soft, gravid, appropriate for gestational age. Pain/Pressure: Absent     Pelvic:  Cervical exam deferred        Extremities: Normal range of motion.  Edema: None  Mental Status: Normal mood and affect. Normal behavior. Normal judgment and thought content.     Assessment   28 y.o. [id number] at 102w3d, EDD 05/31/2019 by Last Menstrual Period presenting for a routine prenatal  visit.  Plan   FIFTH Problems (from 11/06/18 to present)    Problem Noted Resolved   Supervision of other normal pregnancy, antepartum 03/23/2015 by Morene Crocker, CNM No   Overview Addendum 02/04/2019  8:42 PM by Malachy Mood, MD    Clinic [facility] Prenatal Labs  Dating LMP = 12 week [place] Blood type: B/Positive/-- (05/13 1553)   Genetic Screen NIPS:Normal XY Antibody:Negative (05/13 1553)  Anatomic [place] Normal Rubella: 1.53 (05/13 1553) Varicella: Non-Immune  GTT Early:               Third trimester:  RPR: Non Reactive (05/13 1553)   Rhogam N/A HBsAg: Negative (05/13 1553)   TDaP vaccine  Flu Shot: HIV: Non Reactive (05/13 1553)   Baby Food Breast                               GBS:   Contraception Considering BTL Pap: 03/04/2015 NIL  CBB     CS/VBAC NA   Support Person Husband Timmothy Sours           GTT/labs and urine culture today as it had not been previously collected.  Patient has questions about circumcision; pediatrician is in [place] and she is not certain if [facility] will do the procedure in the hospital. Brochure given for circumcision clinic.   Preterm labor symptoms and general obstetric precautions including but not limited to vaginal bleeding, contractions, leaking of fluid and fetal movement were reviewed.  Please refer to After Visit Summary for other counseling recommendations.   Return in about 2 weeks (around 03/18/2019) for ROB.  Avel Sensor, CNM 03/04/2019  10:24 AM

## 2019-03-04 NOTE — Patient Instructions (Signed)
Third Trimester of Pregnancy The third trimester is from week 28 through week 40 (months 7 through 9). The third trimester is a time when the unborn baby (fetus) is growing rapidly. At the end of the ninth month, the fetus is about 20 inches in length and weighs 6-10 pounds. Body changes during your third trimester Your body will continue to go through many changes during pregnancy. The changes vary from woman to woman. During the third trimester:  Your weight will continue to increase. You can expect to gain 25-35 pounds (11-16 kg) by the end of the pregnancy.  You may begin to get stretch marks on your hips, abdomen, and breasts.  You may urinate more often because the fetus is moving lower into your pelvis and pressing on your bladder.  You may develop or continue to have heartburn. This is caused by increased hormones that slow down muscles in the digestive tract.  You may develop or continue to have constipation because increased hormones slow digestion and cause the muscles that push waste through your intestines to relax.  You may develop hemorrhoids. These are swollen veins (varicose veins) in the rectum that can itch or be painful.  You may develop swollen, bulging veins (varicose veins) in your legs.  You may have increased body aches in the pelvis, back, or thighs. This is due to weight gain and increased hormones that are relaxing your joints.  You may have changes in your hair. These can include thickening of your hair, rapid growth, and changes in texture. Some women also have hair loss during or after pregnancy, or hair that feels dry or thin. Your hair will most likely return to normal after your baby is born.  Your breasts will continue to grow and they will continue to become tender. A yellow fluid (colostrum) may leak from your breasts. This is the first milk you are producing for your baby.  Your belly button may stick out.  You may notice more swelling in your hands,  face, or ankles.  You may have increased tingling or numbness in your hands, arms, and legs. The skin on your belly may also feel numb.  You may feel short of breath because of your expanding uterus.  You may have more problems sleeping. This can be caused by the size of your belly, increased need to urinate, and an increase in your body's metabolism.  You may notice the fetus "dropping," or moving lower in your abdomen (lightening).  You may have increased vaginal discharge.  You may notice your joints feel loose and you may have pain around your pelvic bone. What to expect at prenatal visits You will have prenatal exams every 2 weeks until week 36. Then you will have weekly prenatal exams. During a routine prenatal visit:  You will be weighed to make sure you and the baby are growing normally.  Your blood pressure will be taken.  Your abdomen will be measured to track your baby's growth.  The fetal heartbeat will be listened to.  Any test results from the previous visit will be discussed.  You may have a cervical check near your due date to see if your cervix has softened or thinned (effaced).  You will be tested for Group B streptococcus. This happens between 35 and 37 weeks. Your health care provider may ask you:  What your birth plan is.  How you are feeling.  If you are feeling the baby move.  If you have had any abnormal   symptoms, such as leaking fluid, bleeding, severe headaches, or abdominal cramping.  If you are using any tobacco products, including cigarettes, chewing tobacco, and electronic cigarettes.  If you have any questions. Other tests or screenings that may be performed during your third trimester include:  Blood tests that check for low iron levels (anemia).  Fetal testing to check the health, activity level, and growth of the fetus. Testing is done if you have certain medical conditions or if there are problems during the pregnancy.  Nonstress test  (NST). This test checks the health of your baby to make sure there are no signs of problems, such as the baby not getting enough oxygen. During this test, a belt is placed around your belly. The baby is made to move, and its heart rate is monitored during movement. What is false labor? False labor is a condition in which you feel small, irregular tightenings of the muscles in the womb (contractions) that usually go away with rest, changing position, or drinking water. These are called Braxton Hicks contractions. Contractions may last for hours, days, or even weeks before true labor sets in. If contractions come at regular intervals, become more frequent, increase in intensity, or become painful, you should see your health care provider. What are the signs of labor?  Abdominal cramps.  Regular contractions that start at 10 minutes apart and become stronger and more frequent with time.  Contractions that start on the top of the uterus and spread down to the lower abdomen and back.  Increased pelvic pressure and dull back pain.  A watery or bloody mucus discharge that comes from the vagina.  Leaking of amniotic fluid. This is also known as your "water breaking." It could be a slow trickle or a gush. Let your health care provider know if it has a color or strange odor. If you have any of these signs, call your health care provider right away, even if it is before your due date. Follow these instructions at home: Medicines  Follow your health care provider's instructions regarding medicine use. Specific medicines may be either safe or unsafe to take during pregnancy.  Take a prenatal vitamin that contains at least 600 micrograms (mcg) of folic acid.  If you develop constipation, try taking a stool softener if your health care provider approves. Eating and drinking   Eat a balanced diet that includes fresh fruits and vegetables, whole grains, good sources of protein such as meat, eggs, or tofu,  and low-fat dairy. Your health care provider will help you determine the amount of weight gain that is right for you.  Avoid raw meat and uncooked cheese. These carry germs that can cause birth defects in the baby.  If you have low calcium intake from food, talk to your health care provider about whether you should take a daily calcium supplement.  Eat four or five small meals rather than three large meals a day.  Limit foods that are high in fat and processed sugars, such as fried and sweet foods.  To prevent constipation: ? Drink enough fluid to keep your urine clear or pale yellow. ? Eat foods that are high in fiber, such as fresh fruits and vegetables, whole grains, and beans. Activity  Exercise only as directed by your health care provider. Most women can continue their usual exercise routine during pregnancy. Try to exercise for 30 minutes at least 5 days a week. Stop exercising if you experience uterine contractions.  Avoid heavy lifting.  Do   not exercise in extreme heat or humidity, or at high altitudes.  Wear low-heel, comfortable shoes.  Practice good posture.  You may continue to have sex unless your health care provider tells you otherwise. Relieving pain and discomfort  Take frequent breaks and rest with your legs elevated if you have leg cramps or low back pain.  Take warm sitz baths to soothe any pain or discomfort caused by hemorrhoids. Use hemorrhoid cream if your health care provider approves.  Wear a good support bra to prevent discomfort from breast tenderness.  If you develop varicose veins: ? Wear support pantyhose or compression stockings as told by your healthcare provider. ? Elevate your feet for 15 minutes, 3-4 times a day. Prenatal care  Write down your questions. Take them to your prenatal visits.  Keep all your prenatal visits as told by your health care provider. This is important. Safety  Wear your seat belt at all times when driving.  Make  a list of emergency phone numbers, including numbers for family, friends, the hospital, and police and fire departments. General instructions  Avoid cat litter boxes and soil used by cats. These carry germs that can cause birth defects in the baby. If you have a cat, ask someone to clean the litter box for you.  Do not travel far distances unless it is absolutely necessary and only with the approval of your health care provider.  Do not use hot tubs, steam rooms, or saunas.  Do not drink alcohol.  Do not use any products that contain nicotine or tobacco, such as cigarettes and e-cigarettes. If you need help quitting, ask your health care provider.  Do not use any medicinal herbs or unprescribed drugs. These chemicals affect the formation and growth of the baby.  Do not douche or use tampons or scented sanitary pads.  Do not cross your legs for long periods of time.  To prepare for the arrival of your baby: ? Take prenatal classes to understand, practice, and ask questions about labor and delivery. ? Make a trial run to the hospital. ? Visit the hospital and tour the maternity area. ? Arrange for maternity or paternity leave through employers. ? Arrange for family and friends to take care of pets while you are in the hospital. ? Purchase a rear-facing car seat and make sure you know how to install it in your car. ? Pack your hospital bag. ? Prepare the baby's nursery. Make sure to remove all pillows and stuffed animals from the baby's crib to prevent suffocation.  Visit your dentist if you have not gone during your pregnancy. Use a soft toothbrush to brush your teeth and be gentle when you floss. Contact a health care provider if:  You are unsure if you are in labor or if your water has broken.  You become dizzy.  You have mild pelvic cramps, pelvic pressure, or nagging pain in your abdominal area.  You have lower back pain.  You have persistent nausea, vomiting, or diarrhea.   You have an unusual or bad smelling vaginal discharge.  You have pain when you urinate. Get help right away if:  Your water breaks before 37 weeks.  You have regular contractions less than 5 minutes apart before 37 weeks.  You have a fever.  You are leaking fluid from your vagina.  You have spotting or bleeding from your vagina.  You have severe abdominal pain or cramping.  You have rapid weight loss or weight gain.  You have   shortness of breath with chest pain.  You notice sudden or extreme swelling of your face, hands, ankles, feet, or legs.  Your baby makes fewer than 10 movements in 2 hours.  You have severe headaches that do not go away when you take medicine.  You have vision changes. Summary  The third trimester is from week 28 through week 40, months 7 through 9. The third trimester is a time when the unborn baby (fetus) is growing rapidly.  During the third trimester, your discomfort may increase as you and your baby continue to gain weight. You may have abdominal, leg, and back pain, sleeping problems, and an increased need to urinate.  During the third trimester your breasts will keep growing and they will continue to become tender. A yellow fluid (colostrum) may leak from your breasts. This is the first milk you are producing for your baby.  False labor is a condition in which you feel small, irregular tightenings of the muscles in the womb (contractions) that eventually go away. These are called Braxton Hicks contractions. Contractions may last for hours, days, or even weeks before true labor sets in.  Signs of labor can include: abdominal cramps; regular contractions that start at 10 minutes apart and become stronger and more frequent with time; watery or bloody mucus discharge that comes from the vagina; increased pelvic pressure and dull back pain; and leaking of amniotic fluid. This information is not intended to replace advice given to you by your health  care provider. Make sure you discuss any questions you have with your health care provider. Document Released: 06/12/2001 Document Revised: 10/09/2018 Document Reviewed: 07/24/2016 Elsevier Patient Education  2020 [employer]  

## 2019-03-04 NOTE — Progress Notes (Signed)
No problems.[email or web address] 

## 2019-03-05 LAB — 28 WEEK RH+PANEL
Basophils Absolute: 0 10*3/uL (ref 0.0–0.2)
Basos: 0 %
EOS (ABSOLUTE): 0.1 10*3/uL (ref 0.0–0.4)
Eos: 1 %
Gestational Diabetes Screen: 86 mg/dL (ref 65–139)
HIV Screen 4th Generation wRfx: NONREACTIVE
Hematocrit: 37.5 % (ref 34.0–46.6)
Hemoglobin: 12.6 g/dL (ref 11.1–15.9)
Immature Grans (Abs): 0.2 10*3/uL — ABNORMAL HIGH (ref 0.0–0.1)
Immature Granulocytes: 1 %
Lymphocytes Absolute: 0.9 10*3/uL (ref 0.7–3.1)
Lymphs: 8 %
MCH: 31 pg (ref 26.6–33.0)
MCHC: 33.6 g/dL (ref 31.5–35.7)
MCV: 92 fL (ref 79–97)
Monocytes Absolute: 0.5 10*3/uL (ref 0.1–0.9)
Monocytes: 5 %
Neutrophils Absolute: 9.6 10*3/uL — ABNORMAL HIGH (ref 1.4–7.0)
Neutrophils: 85 %
Platelets: 154 10*3/uL (ref 150–450)
RBC: 4.07 x10E6/uL (ref 3.77–5.28)
RDW: 12.9 % (ref 11.7–15.4)
RPR Ser Ql: NONREACTIVE
WBC: 11.3 10*3/uL — ABNORMAL HIGH (ref 3.4–10.8)

## 2019-03-06 LAB — URINE CULTURE

## 2019-03-13 ENCOUNTER — Encounter: Payer: Self-pay | Admitting: Obstetrics and Gynecology

## 2019-03-18 ENCOUNTER — Other Ambulatory Visit: Payer: Self-pay

## 2019-03-18 ENCOUNTER — Encounter: Payer: Self-pay | Admitting: Advanced Practice Midwife

## 2019-03-18 ENCOUNTER — Ambulatory Visit (INDEPENDENT_AMBULATORY_CARE_PROVIDER_SITE_OTHER): Payer: Medicaid Other | Admitting: Advanced Practice Midwife

## 2019-03-18 VITALS — BP 110/60 | Wt 173.0 lb

## 2019-03-18 DIAGNOSIS — Z3A29 29 weeks gestation of pregnancy: Secondary | ICD-10-CM

## 2019-03-18 DIAGNOSIS — Z3483 Encounter for supervision of other normal pregnancy, third trimester: Secondary | ICD-10-CM

## 2019-03-18 NOTE — Progress Notes (Signed)
ROB No concerns Wants to wait till October for flu shot

## 2019-03-18 NOTE — Progress Notes (Signed)
  Routine Prenatal Care Visit  Subjective  Mallory Craig is a 28 y.o. [phone] at [redacted]w[redacted]d being seen today for ongoing prenatal care.  She is currently monitored for the following issues for this low-risk pregnancy and has Anxiety and depression; Supervision of other normal pregnancy, antepartum; and Family history of genetic disorder on their problem list.  ----------------------------------------------------------------------------------- Patient reports no complaints.  We discussed various methods of birth control. She is still considering a tubal ligation but is not 100% sure.  Contractions: Not present. Vag. Bleeding: None.  Movement: Present. Leaking Fluid denies.  ----------------------------------------------------------------------------------- The following portions of the patient's history were reviewed and updated as appropriate: allergies, current medications, past family history, past medical history, past social history, past surgical history and problem list. Problem list updated.  Objective  Blood pressure 110/60, weight 173 lb (78.5 kg), last menstrual period 08/24/2018. Pregravid weight 151 lb (68.5 kg) Total Weight Gain 22 lb (9.979 kg) Urinalysis: Urine Protein    Urine Glucose    Fetal Status: Fetal Heart Rate (bpm): 136 Fundal Height: 29 cm Movement: Present     General:  Alert, oriented and cooperative. Patient is in no acute distress.  Skin: Skin is warm and dry. No rash noted.   Cardiovascular: Normal heart rate noted  Respiratory: Normal respiratory effort, no problems with respiration noted  Abdomen: Soft, gravid, appropriate for gestational age. Pain/Pressure: Absent     Pelvic:  Cervical exam deferred        Extremities: Normal range of motion.     Mental Status: Normal mood and affect. Normal behavior. Normal judgment and thought content.   Assessment   28 y.o. [id number] at [redacted]w[redacted]d by  05/31/2019, by Last Menstrual Period presenting for routine prenatal visit  Plan   FIFTH Problems (from 11/06/18 to present)    Problem Noted Resolved   Supervision of other normal pregnancy, antepartum 03/23/2015 by Morene Crocker, CNM No   Overview Addendum 03/05/2019  9:10 AM by Malachy Mood, MD    [facility]inic [facility] Prenatal Labs  Dating LMP = 12 week [place] Blood type: B/Positive/-- (05/13 1553)   Genetic Screen NIPS:Normal XY Antibody:Negative (05/13 1553)  Anatomic [place] Normal Rubella: 1.53 (05/13 1553) Varicella: Non-Immune  GTT Third trimester: 86 RPR: Non Reactive (05/13 1553)   Rhogam N/A HBsAg: Negative (05/13 1553)   TDaP vaccine  Flu Shot: HIV: Non Reactive (05/13 1553)   Baby Food Breast                               GBS:   Contraception Considering BTL Pap: 03/04/2015 NIL  CBB     CS/VBAC NA   Support Person Husband Don              Preterm labor symptoms and general obstetric precautions including but not limited to vaginal bleeding, contractions, leaking of fluid and fetal movement were reviewed in detail with the patient. Tubal consent signed today.   Return in about 2 weeks (around 04/01/2019) for rob.  Rod Can, CNM 03/18/2019 3:38 PM

## 2019-04-01 ENCOUNTER — Encounter: Payer: Self-pay | Admitting: Obstetrics & Gynecology

## 2019-04-01 ENCOUNTER — Other Ambulatory Visit: Payer: Self-pay

## 2019-04-01 ENCOUNTER — Ambulatory Visit (INDEPENDENT_AMBULATORY_CARE_PROVIDER_SITE_OTHER): Payer: Medicaid Other | Admitting: Obstetrics & Gynecology

## 2019-04-01 VITALS — BP 100/60 | Wt 169.0 lb

## 2019-04-01 DIAGNOSIS — Z3483 Encounter for supervision of other normal pregnancy, third trimester: Secondary | ICD-10-CM

## 2019-04-01 DIAGNOSIS — Z23 Encounter for immunization: Secondary | ICD-10-CM

## 2019-04-01 DIAGNOSIS — Z3A31 31 weeks gestation of pregnancy: Secondary | ICD-10-CM

## 2019-04-01 DIAGNOSIS — Z3480 Encounter for supervision of other normal pregnancy, unspecified trimester: Secondary | ICD-10-CM

## 2019-04-01 LAB — POCT URINALYSIS DIPSTICK OB
Glucose, UA: NEGATIVE
POC,PROTEIN,UA: NEGATIVE

## 2019-04-01 NOTE — Addendum Note (Signed)
Addended by: Quintella Baton D on: 04/01/2019 03:10 PM   Modules accepted: Orders

## 2019-04-01 NOTE — Progress Notes (Signed)
  Subjective  Fetal Movement? yes Contractions? no Leaking Fluid? no Vaginal Bleeding? no  Objective  BP 100/60   Wt 169 lb (76.7 kg)   LMP 08/24/2018 (Exact Date)   BMI 26.47 kg/m  General: NAD Pumonary: no increased work of breathing Abdomen: gravid, non-tender Extremities: no edema Psychiatric: mood appropriate, affect full  Assessment  28 y.o. [id number] at [redacted]w[redacted]d by  05/31/2019, by Last Menstrual Period presenting for routine prenatal visit  Plan   Problem List Items Addressed This Visit      Other   Supervision of other normal pregnancy, antepartum    Other Visit Diagnoses    [redacted] weeks gestation of pregnancy    -  Primary      FIFTH Problems (from 11/06/18 to present)    Problem Noted Resolved   Supervision of other normal pregnancy, antepartum 03/23/2015 by Morene Crocker, CNM No   Overview Addendum 04/01/2019  2:47 PM by Gae Dry, MD    [facility] Prenatal Labs  Dating LMP = 12 week [place] Blood type: B/Positive/-- (05/13 1553)   Genetic Screen NIPS:Normal XY Antibody:Negative (05/13 1553)  Anatomic [place] Normal Rubella: 1.53 (05/13 1553) Varicella: Non-Immune  GTT Third trimester: 86 RPR: Non Reactive (05/13 1553)   Rhogam N/A HBsAg: Negative (05/13 1553)   TDaP vaccine  9/30 Flu Shot: HIV: Non Reactive (05/13 1553)   Baby Food Breast                               GBS: p  Contraception Considering BTL Pap: 03/04/2015 NIL  CBB  no   CS/VBAC NA   Support Person Husband Mallory Craig            TDaP today  Flu nv  BTL vs IUD  Breast feeding counseled  Barnett Applebaum, MD, [facility], [facility] 04/01/2019  2:48 PM

## 2019-04-16 ENCOUNTER — Ambulatory Visit (INDEPENDENT_AMBULATORY_CARE_PROVIDER_SITE_OTHER): Payer: Medicaid Other | Admitting: Maternal Newborn

## 2019-04-16 ENCOUNTER — Encounter: Payer: Self-pay | Admitting: Maternal Newborn

## 2019-04-16 ENCOUNTER — Other Ambulatory Visit: Payer: Self-pay

## 2019-04-16 VITALS — BP 118/74 | Wt 176.0 lb

## 2019-04-16 DIAGNOSIS — Z3A33 33 weeks gestation of pregnancy: Secondary | ICD-10-CM

## 2019-04-16 DIAGNOSIS — Z3480 Encounter for supervision of other normal pregnancy, unspecified trimester: Secondary | ICD-10-CM

## 2019-04-16 DIAGNOSIS — Z3483 Encounter for supervision of other normal pregnancy, third trimester: Secondary | ICD-10-CM

## 2019-04-16 NOTE — Progress Notes (Signed)
No vb. No lof.  

## 2019-04-16 NOTE — Progress Notes (Signed)
    Routine Prenatal Care Visit  Subjective  Mallory Craig is a 28 y.o. [phone] at [redacted]w[redacted]d being seen today for ongoing prenatal care.  She is currently monitored for the following issues for this low-risk pregnancy and has Anxiety and depression; Supervision of other normal pregnancy, antepartum; and Family history of genetic disorder on their problem list.  ----------------------------------------------------------------------------------- Patient reports fatigue and discomforts of late pregnancy, occasional Braxton Hicks contractions.   Contractions: Not present. Vag. Bleeding: None.  Movement: Present. No leaking of fluid.  ----------------------------------------------------------------------------------- The following portions of the patient's history were reviewed and updated as appropriate: allergies, current medications, past family history, past medical history, past social history, past surgical history and problem list. Problem list updated.   Objective  Blood pressure 118/74, weight 176 lb (79.8 kg), last menstrual period 08/24/2018. Pregravid weight 151 lb (68.5 kg) Total Weight Gain 25 lb (11.3 kg)  Fetal Status: Fetal Heart Rate (bpm): 135 Fundal Height: 33 cm Movement: Present     General:  Alert, oriented and cooperative. Patient is in no acute distress.  Skin: Skin is warm and dry. No rash noted.   Cardiovascular: Normal heart rate noted  Respiratory: Normal respiratory effort, no problems with respiration noted  Abdomen: Soft, gravid, appropriate for gestational age. Pain/Pressure: Absent     Pelvic:  Cervical exam deferred        Extremities: Normal range of motion.  Edema: None  Mental Status: Normal mood and affect. Normal behavior. Normal judgment and thought content.     Assessment   28 y.o. [id number] at [redacted]w[redacted]d, EDD 05/31/2019 by Last Menstrual Period presenting for a routine prenatal visit.  Plan   FIFTH Problems (from 11/06/18 to present)    Problem Noted  Resolved   Supervision of other normal pregnancy, antepartum 03/23/2015 by Morene Crocker, CNM No   Overview Addendum 04/01/2019  2:47 PM by Gae Dry, MD    [facility]inic [facility] Prenatal Labs  Dating LMP = 12 week [place] Blood type: B/Positive/-- (05/13 1553)   Genetic Screen NIPS:Normal XY Antibody:Negative (05/13 1553)  Anatomic [place] Normal Rubella: 1.53 (05/13 1553) Varicella: Non-Immune  GTT Third trimester: 86 RPR: Non Reactive (05/13 1553)   Rhogam N/A HBsAg: Negative (05/13 1553)   TDaP vaccine  9/30 Flu Shot: HIV: Non Reactive (05/13 1553)   Baby Food Breast                               GBS:   Contraception Considering BTL Pap: 03/04/2015 NIL  CBB  no   CS/VBAC NA   Support Person Husband Don           Reviewed comfort measures for [employer] and musculoskeletal pain.  Accepted flu vaccine today.   Please refer to After Visit Summary for other counseling recommendations.   Return in about 2 weeks (around 04/30/2019) for ROB.  Avel Sensor, CNM 04/16/2019  3:44 PM

## 2019-04-16 NOTE — Patient Instructions (Signed)
Third Trimester of Pregnancy The third trimester is from week 28 through week 40 (months 7 through 9). The third trimester is a time when the unborn baby (fetus) is growing rapidly. At the end of the ninth month, the fetus is about 20 inches in length and weighs 6-10 pounds. Body changes during your third trimester Your body will continue to go through many changes during pregnancy. The changes vary from woman to woman. During the third trimester:  Your weight will continue to increase. You can expect to gain 25-35 pounds (11-16 kg) by the end of the pregnancy.  You may begin to get stretch marks on your hips, abdomen, and breasts.  You may urinate more often because the fetus is moving lower into your pelvis and pressing on your bladder.  You may develop or continue to have heartburn. This is caused by increased hormones that slow down muscles in the digestive tract.  You may develop or continue to have constipation because increased hormones slow digestion and cause the muscles that push waste through your intestines to relax.  You may develop hemorrhoids. These are swollen veins (varicose veins) in the rectum that can itch or be painful.  You may develop swollen, bulging veins (varicose veins) in your legs.  You may have increased body aches in the pelvis, back, or thighs. This is due to weight gain and increased hormones that are relaxing your joints.  You may have changes in your hair. These can include thickening of your hair, rapid growth, and changes in texture. Some women also have hair loss during or after pregnancy, or hair that feels dry or thin. Your hair will most likely return to normal after your baby is born.  Your breasts will continue to grow and they will continue to become tender. A yellow fluid (colostrum) may leak from your breasts. This is the first milk you are producing for your baby.  Your belly button may stick out.  You may notice more swelling in your hands,  face, or ankles.  You may have increased tingling or numbness in your hands, arms, and legs. The skin on your belly may also feel numb.  You may feel short of breath because of your expanding uterus.  You may have more problems sleeping. This can be caused by the size of your belly, increased need to urinate, and an increase in your body's metabolism.  You may notice the fetus "dropping," or moving lower in your abdomen (lightening).  You may have increased vaginal discharge.  You may notice your joints feel loose and you may have pain around your pelvic bone. What to expect at prenatal visits You will have prenatal exams every 2 weeks until week 36. Then you will have weekly prenatal exams. During a routine prenatal visit:  You will be weighed to make sure you and the baby are growing normally.  Your blood pressure will be taken.  Your abdomen will be measured to track your baby's growth.  The fetal heartbeat will be listened to.  Any test results from the previous visit will be discussed.  You may have a cervical check near your due date to see if your cervix has softened or thinned (effaced).  You will be tested for Group B streptococcus. This happens between 35 and 37 weeks. Your health care provider may ask you:  What your birth plan is.  How you are feeling.  If you are feeling the baby move.  If you have had any abnormal   symptoms, such as leaking fluid, bleeding, severe headaches, or abdominal cramping.  If you are using any tobacco products, including cigarettes, chewing tobacco, and electronic cigarettes.  If you have any questions. Other tests or screenings that may be performed during your third trimester include:  Blood tests that check for low iron levels (anemia).  Fetal testing to check the health, activity level, and growth of the fetus. Testing is done if you have certain medical conditions or if there are problems during the pregnancy.  Nonstress test  (NST). This test checks the health of your baby to make sure there are no signs of problems, such as the baby not getting enough oxygen. During this test, a belt is placed around your belly. The baby is made to move, and its heart rate is monitored during movement. What is false labor? False labor is a condition in which you feel small, irregular tightenings of the muscles in the womb (contractions) that usually go away with rest, changing position, or drinking water. These are called Braxton Hicks contractions. Contractions may last for hours, days, or even weeks before true labor sets in. If contractions come at regular intervals, become more frequent, increase in intensity, or become painful, you should see your health care provider. What are the signs of labor?  Abdominal cramps.  Regular contractions that start at 10 minutes apart and become stronger and more frequent with time.  Contractions that start on the top of the uterus and spread down to the lower abdomen and back.  Increased pelvic pressure and dull back pain.  A watery or bloody mucus discharge that comes from the vagina.  Leaking of amniotic fluid. This is also known as your "water breaking." It could be a slow trickle or a gush. Let your health care provider know if it has a color or strange odor. If you have any of these signs, call your health care provider right away, even if it is before your due date. Follow these instructions at home: Medicines  Follow your health care provider's instructions regarding medicine use. Specific medicines may be either safe or unsafe to take during pregnancy.  Take a prenatal vitamin that contains at least 600 micrograms (mcg) of folic acid.  If you develop constipation, try taking a stool softener if your health care provider approves. Eating and drinking   Eat a balanced diet that includes fresh fruits and vegetables, whole grains, good sources of protein such as meat, eggs, or tofu,  and low-fat dairy. Your health care provider will help you determine the amount of weight gain that is right for you.  Avoid raw meat and uncooked cheese. These carry germs that can cause birth defects in the baby.  If you have low calcium intake from food, talk to your health care provider about whether you should take a daily calcium supplement.  Eat four or five small meals rather than three large meals a day.  Limit foods that are high in fat and processed sugars, such as fried and sweet foods.  To prevent constipation: ? Drink enough fluid to keep your urine clear or pale yellow. ? Eat foods that are high in fiber, such as fresh fruits and vegetables, whole grains, and beans. Activity  Exercise only as directed by your health care provider. Most women can continue their usual exercise routine during pregnancy. Try to exercise for 30 minutes at least 5 days a week. Stop exercising if you experience uterine contractions.  Avoid heavy lifting.  Do   not exercise in extreme heat or humidity, or at high altitudes.  Wear low-heel, comfortable shoes.  Practice good posture.  You may continue to have sex unless your health care provider tells you otherwise. Relieving pain and discomfort  Take frequent breaks and rest with your legs elevated if you have leg cramps or low back pain.  Take warm sitz baths to soothe any pain or discomfort caused by hemorrhoids. Use hemorrhoid cream if your health care provider approves.  Wear a good support bra to prevent discomfort from breast tenderness.  If you develop varicose veins: ? Wear support pantyhose or compression stockings as told by your healthcare provider. ? Elevate your feet for 15 minutes, 3-4 times a day. Prenatal care  Write down your questions. Take them to your prenatal visits.  Keep all your prenatal visits as told by your health care provider. This is important. Safety  Wear your seat belt at all times when driving.  Make  a list of emergency phone numbers, including numbers for family, friends, the hospital, and police and fire departments. General instructions  Avoid cat litter boxes and soil used by cats. These carry germs that can cause birth defects in the baby. If you have a cat, ask someone to clean the litter box for you.  Do not travel far distances unless it is absolutely necessary and only with the approval of your health care provider.  Do not use hot tubs, steam rooms, or saunas.  Do not drink alcohol.  Do not use any products that contain nicotine or tobacco, such as cigarettes and e-cigarettes. If you need help quitting, ask your health care provider.  Do not use any medicinal herbs or unprescribed drugs. These chemicals affect the formation and growth of the baby.  Do not douche or use tampons or scented sanitary pads.  Do not cross your legs for long periods of time.  To prepare for the arrival of your baby: ? Take prenatal classes to understand, practice, and ask questions about labor and delivery. ? Make a trial run to the hospital. ? Visit the hospital and tour the maternity area. ? Arrange for maternity or paternity leave through employers. ? Arrange for family and friends to take care of pets while you are in the hospital. ? Purchase a rear-facing car seat and make sure you know how to install it in your car. ? Pack your hospital bag. ? Prepare the baby's nursery. Make sure to remove all pillows and stuffed animals from the baby's crib to prevent suffocation.  Visit your dentist if you have not gone during your pregnancy. Use a soft toothbrush to brush your teeth and be gentle when you floss. Contact a health care provider if:  You are unsure if you are in labor or if your water has broken.  You become dizzy.  You have mild pelvic cramps, pelvic pressure, or nagging pain in your abdominal area.  You have lower back pain.  You have persistent nausea, vomiting, or diarrhea.   You have an unusual or bad smelling vaginal discharge.  You have pain when you urinate. Get help right away if:  Your water breaks before 37 weeks.  You have regular contractions less than 5 minutes apart before 37 weeks.  You have a fever.  You are leaking fluid from your vagina.  You have spotting or bleeding from your vagina.  You have severe abdominal pain or cramping.  You have rapid weight loss or weight gain.  You have   shortness of breath with chest pain.  You notice sudden or extreme swelling of your face, hands, ankles, feet, or legs.  Your baby makes fewer than 10 movements in 2 hours.  You have severe headaches that do not go away when you take medicine.  You have vision changes. Summary  The third trimester is from week 28 through week 40, months 7 through 9. The third trimester is a time when the unborn baby (fetus) is growing rapidly.  During the third trimester, your discomfort may increase as you and your baby continue to gain weight. You may have abdominal, leg, and back pain, sleeping problems, and an increased need to urinate.  During the third trimester your breasts will keep growing and they will continue to become tender. A yellow fluid (colostrum) may leak from your breasts. This is the first milk you are producing for your baby.  False labor is a condition in which you feel small, irregular tightenings of the muscles in the womb (contractions) that eventually go away. These are called Braxton Hicks contractions. Contractions may last for hours, days, or even weeks before true labor sets in.  Signs of labor can include: abdominal cramps; regular contractions that start at 10 minutes apart and become stronger and more frequent with time; watery or bloody mucus discharge that comes from the vagina; increased pelvic pressure and dull back pain; and leaking of amniotic fluid. This information is not intended to replace advice given to you by your health  care provider. Make sure you discuss any questions you have with your health care provider. Document Released: 06/12/2001 Document Revised: 10/09/2018 Document Reviewed: 07/24/2016 Elsevier Patient Education  2020 [employer]  

## 2019-04-19 ENCOUNTER — Other Ambulatory Visit: Payer: Self-pay

## 2019-04-19 ENCOUNTER — Inpatient Hospital Stay
Admission: EM | Admit: 2019-04-19 | Discharge: 2019-04-19 | Disposition: A | Discharge status: Home / Self Care | Payer: Medicaid Other | Attending: Obstetrics and Gynecology | Admitting: Obstetrics and Gynecology

## 2019-04-19 DIAGNOSIS — Z3480 Encounter for supervision of other normal pregnancy, unspecified trimester: Secondary | ICD-10-CM

## 2019-04-19 DIAGNOSIS — Z3483 Encounter for supervision of other normal pregnancy, third trimester: Secondary | ICD-10-CM | POA: Diagnosis not present

## 2019-04-19 DIAGNOSIS — O4703 False labor before 37 completed weeks of gestation, third trimester: Secondary | ICD-10-CM | POA: Diagnosis not present

## 2019-04-19 DIAGNOSIS — O09293 Supervision of pregnancy with other poor reproductive or obstetric history, third trimester: Secondary | ICD-10-CM | POA: Diagnosis not present

## 2019-04-19 DIAGNOSIS — Z3A34 34 weeks gestation of pregnancy: Secondary | ICD-10-CM | POA: Diagnosis not present

## 2019-04-19 LAB — ROM PLUS (ARMC ONLY): Rom Plus: NEGATIVE

## 2019-04-19 NOTE — Discharge Instructions (Signed)
Premature Rupture and Preterm Premature Rupture of Membranes  A sac made up of membranes surrounds your baby in the womb (uterus). Rupture of membranes is when this sac breaks open. This is also known as your "water breaking." When this sac breaks before labor starts, it is called premature rupture of membranes (PROM). If this happens before 37 weeks of being pregnant, it is called preterm premature rupture of membranes (PPROM). PPROM is serious. It needs medical care right away. What increases the risk of PPROM? PPROM is more likely to happen in women who:  Have an infection.  Have had PPROM before.  Have a cervix that is short.  Have bleeding during the second or third trimester.  Have a low BMI. This is a measure of body fat.  Smoke.  Use drugs.  Have a low socioeconomic status. What problems can be caused by PROM and PPROM? This condition creates health dangers for the mother and the baby. These include:  Giving birth to the baby too early (prematurely).  Getting a serious infection of the placenta (chorioamnionitis).  Having the placenta detach from the uterus early (placental abruption).  Squeezing of the umbilical cord.  Getting a serious infection after delivery. What are the signs of PROM and PPROM?  A sudden gush of fluid from the vagina.  A slow leak of fluid from the vagina.  Your underwear is wet. What should I do if I think my water broke? Call your doctor right away. You will need to go to the hospital to get checked right away. What happens if I am told that I have PROM or PPROM? You will have tests done at the hospital.  If you have PROM, you may be given medicine to start labor (be induced). This may be done if you are not having contractions during the 24 hours after your water broke.  If you have PPROM and are not having contractions, you may be given medicine to start labor. It will depend on how far along you are in your pregnancy. If you have  PPROM:  You and your baby will be watched closely to see if you have infections or other problems.  You may be given: ? An antibiotic medicine. This can stop an infection from starting. ? A steroid medicine. This can help your baby's lungs develop faster. ? A medicine to help prevent cerebral palsy in your baby. ? A medicine to stop early labor (preterm labor).  You may be told to stay in bed except to use the bathroom (bed rest).  You may be given medicine to start labor. This may be done if there are problems with you or the baby. Your treatment will depend on many factors. Contact a doctor if:  Your water breaks and you are not having contractions. Get help right away if:  Your water breaks before you are [redacted] weeks pregnant. Summary  When your water breaks before labor starts, it is called premature rupture of membranes (PROM).  When PROM happens before 37 weeks of pregnancy, it is called preterm premature rupture of membranes (PPROM).  If you are not having contractions, your labor may be started for you. This information is not intended to replace advice given to you by your health care provider. Make sure you discuss any questions you have with your health care provider. Document Released: 09/14/2008 Document Revised: 10/10/2018 Document Reviewed: 03/08/2016 Elsevier Patient Education  2020 [place].    Fetal Movement Counts Patient Name: ________________________________________________ Patient Due  Date: ____________________ What is a fetal movement count?  A fetal movement count is the number of times that you feel your baby move during a certain amount of time. This may also be called a fetal kick count. A fetal movement count is recommended for every pregnant woman. You may be asked to start counting fetal movements as early as week 28 of your pregnancy. Pay attention to when your baby is most active. You may notice your baby's sleep and wake cycles. You may also  notice things that make your baby move more. You should do a fetal movement count:  When your baby is normally most active.  At the same time each day. A good time to count movements is while you are resting, after having something to eat and drink. How do I count fetal movements? 1. Find a quiet, comfortable area. Sit, or lie down on your side. 2. Write down the date, the start time and stop time, and the number of movements that you felt between those two times. Take this information with you to your health care visits. 3. For 2 hours, count kicks, flutters, swishes, rolls, and jabs. You should feel at least 10 movements during 2 hours. 4. You may stop counting after you have felt 10 movements. 5. If you do not feel 10 movements in 2 hours, have something to eat and drink. Then, keep resting and counting for 1 hour. If you feel at least 4 movements during that hour, you may stop counting. Contact a health care provider if:  You feel fewer than 4 movements in 2 hours.  Your baby is not moving like he or she usually does. Date: ____________ Start time: ____________ Stop time: ____________ Movements: ____________ Date: ____________ Start time: ____________ Stop time: ____________ Movements: ____________ Date: ____________ Start time: ____________ Stop time: ____________ Movements: ____________ Date: ____________ Start time: ____________ Stop time: ____________ Movements: ____________ Date: ____________ Start time: ____________ Stop time: ____________ Movements: ____________ Date: ____________ Start time: ____________ Stop time: ____________ Movements: ____________ Date: ____________ Start time: ____________ Stop time: ____________ Movements: ____________ Date: ____________ Start time: ____________ Stop time: ____________ Movements: ____________ Date: ____________ Start time: ____________ Stop time: ____________ Movements: ____________ This information is not intended to replace advice given  to you by your health care provider. Make sure you discuss any questions you have with your health care provider. Document Released: 07/18/2006 Document Revised: 07/08/2018 Document Reviewed: 07/28/2015 Elsevier Patient Education  2020 [employer].

## 2019-04-19 NOTE — OB Triage Note (Addendum)
Pt is a G5P3 seen in triage at 34wk4d w/ c/o of LOF. Pt states her water broke around 1930 and the fluid was "clear." Pt denies vaginal bleeding, pain. and ctx. Pt states positive fetal movement. FHT 136. Monitors applied and assessing.

## 2019-04-19 NOTE — Final Progress Note (Signed)
Physician Final Progress Note  Patient ID: Fletcher AnonJacqueline Nell MRN: [id number] DOB/AGE: [birth year] 28 y.o.  Admit date: 04/19/2019 Admitting provider: Conard NovakStephen D Romel Dumond, MD Discharge date: 04/19/2019   Admission Diagnoses:  1) intrauterine pregnancy at 6383w4d 2) concern for rupture of membranes  Discharge Diagnoses:  1) intrauterine pregnancy at 6083w4d 2) concern for rupture of membranes - no evidence of rupture of membranes  History of Present Illness: The patient is a 28 y.o. female [phone] at 583w4d by 12 week ultrasound who presents for concern of rupture of membranes. Her pregnancy is complicated by history of IUFD at term. Two hours prior to presentation she had a gush of clear fluid. She states that she continued to trickly some fluid since that time.  She denies vaginal bleeding and contractions. She notes +FM.   Past Medical History:  Diagnosis Date  . Allergy    Claritin  . Anxiety   . Asthma    Childhood; last Albuterol use age 28  . 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 ADENOIDECTOMY    . WISDOM TOOTH EXTRACTION      No current facility-administered medications on file prior to encounter.    Current Outpatient Medications on File Prior to Encounter  Medication Sig Dispense Refill  . Prenatal Vit-Fe Fumarate-FA (PRENATAL VITAMINS) 28-0.8 MG TABS Take 1 tablet by mouth daily. [employer] brand      Allergies  Allergen Reactions  . Tape Hives    Had an epidural and had a piece of tape used and caused hives.  . Bee Venom Swelling    Social History   Socioeconomic History  . Marital status: Single    Spouse name: Not on file  . Number of children: Not on file  . Years of education: Not on file  . Highest education level: Not on file  Occupational History  . Not on file  Social Needs  . Financial resource strain: Not on file  . Food insecurity    Worry: Not on file    Inability: Not on  file  . Transportation needs    Medical: Not on file    Non-medical: Not on file  Tobacco Use  . Smoking status: Never Smoker  . Smokeless tobacco: Never Used  Substance and Sexual Activity  . Alcohol use: No    Alcohol/week: 0.0 standard drinks  . Drug use: No  . Sexual activity: Not Currently    Partners: Male    Birth control/protection: I.U.D.    Comment: still deciding  Lifestyle  . Physical activity    Days per week: Not on file    Minutes per session: Not on file  . Stress: Not on file  Relationships  . Social Musicianconnections    Talks on phone: Not on file    Gets together: Not on file    Attends religious service: Not on file    Active member of club or organization: Not on file    Attends meetings of clubs or organizations: Not on file    Relationship status: Not on file  . Intimate partner violence    Fear of current or ex partner: Not on file    Emotionally abused: Not on file    Physically abused: Not on file    Forced sexual activity: Not on file  Other Topics Concern  . Not on file  Social History Narrative   Marital status: married x 4 years.  Happily married; no abuse      Children:  2 children (2, 1)      Lives: with husband, 2 children.      Employment:  Homemaker      Tobacco: none      Alcohol: 2 drinks per week      Drugs:  None      Exercise:  No formal exercise.      Family History  Problem Relation Age of Onset  . Cancer Mother        skin cancer  . Arthritis Mother   . Hyperthyroidism Mother   . Cancer Maternal Grandmother        Lung  . Cancer Maternal Grandfather   . Asthma Brother      Review of Systems  Constitutional: Negative.   HENT: Negative.   Eyes: Negative.   Respiratory: Negative.   Cardiovascular: Negative.   Gastrointestinal: Negative.   Genitourinary: Negative.   Musculoskeletal: Negative.   Skin: Negative.   Neurological: Negative.   Psychiatric/Behavioral: Negative.      Physical Exam: BP 121/62 (BP  Location: Right Arm)   Pulse 81   Temp 98.1 F (36.7 C) (Oral)   Resp 16   LMP 08/24/2018 (Exact Date)   Physical Exam Constitutional:      General: She is not in acute distress.    Appearance: Normal appearance.  Genitourinary:     Pelvic exam was performed with patient in the lithotomy position.     Vulva, urethra, vagina and cervix normal.     Uterus exam comments: Gravid, NT.     Genitourinary Comments: Negative pooling on SSE  HENT:     Head: Normocephalic and atraumatic.  Eyes:     General: No scleral icterus.    Conjunctiva/sclera: Conjunctivae normal.  Neurological:     General: No focal deficit present.     Mental Status: She is alert and oriented to person, place, and time.     Cranial Nerves: No cranial nerve deficit.  Psychiatric:        Mood and Affect: Mood normal.        Behavior: Behavior normal.        Judgment: Judgment normal.  Vitals signs reviewed. Exam conducted with a chaperone present.    Ferning: negative  Consults: None  Significant Findings/ Diagnostic Studies:  ROM+ Lab Results  Component Value Date   ROMUACOMP NEGATIVE 04/19/2019    Procedures:  NST: Baseline FHR: 120 beats/min Variability: moderate Accelerations: present Decelerations: absent Tocometry: quiet  Interpretation:  INDICATIONS: rule out uterine contractions RESULTS:  A NST procedure was performed with FHR monitoring and a normal baseline established, appropriate time of 20-40 minutes of evaluation, and accels >2 seen w 15x15 characteristics.  Results show a REACTIVE NST.    Hospital Course: The patient was admitted to Labor and Delivery Triage for observation. Her vitals signs were normal. The fetal tracing was category 1 and reactive.  She had no pooling on exam with negative  Ferning. Her ROM+ test was negative. She was reassured by our findings and discharged in stable condition.   Discharge Condition: stable  Disposition: Discharge disposition: 01-Home or Self  Care       Diet: Regular diet  Discharge Activity: Activity as tolerated   Allergies as of 04/19/2019      Reactions   Tape Hives   Had an epidural and had a piece of tape used and caused hives.   Bee Venom Swelling  Medication List    TAKE these medications   Prenatal Vitamins 28-0.8 MG Tabs Take 1 tablet by mouth daily. [employer] brand        Total time spent taking care of this patient: 25 minutes  Signed: Prentice Docker, MD  04/19/2019, 11:00 PM

## 2019-04-19 NOTE — Discharge Summary (Signed)
See final progress note. 

## 2019-05-01 ENCOUNTER — Encounter: Payer: Self-pay | Admitting: Obstetrics and Gynecology

## 2019-05-01 ENCOUNTER — Other Ambulatory Visit (INDEPENDENT_AMBULATORY_CARE_PROVIDER_SITE_OTHER): Payer: Medicaid Other

## 2019-05-01 ENCOUNTER — Ambulatory Visit (INDEPENDENT_AMBULATORY_CARE_PROVIDER_SITE_OTHER): Payer: Medicaid Other | Admitting: Obstetrics and Gynecology

## 2019-05-01 ENCOUNTER — Other Ambulatory Visit (HOSPITAL_COMMUNITY)
Admission: RE | Admit: 2019-05-01 | Discharge: 2019-05-01 | Disposition: A | Discharge status: Home / Self Care | Payer: Medicaid Other | Source: Ambulatory Visit | Attending: Obstetrics and Gynecology | Admitting: Obstetrics and Gynecology

## 2019-05-01 ENCOUNTER — Other Ambulatory Visit: Payer: Self-pay

## 2019-05-01 VITALS — BP 110/64 | Wt 179.0 lb

## 2019-05-01 DIAGNOSIS — O26843 Uterine size-date discrepancy, third trimester: Secondary | ICD-10-CM

## 2019-05-01 DIAGNOSIS — Z3A36 36 weeks gestation of pregnancy: Secondary | ICD-10-CM

## 2019-05-01 DIAGNOSIS — Z3480 Encounter for supervision of other normal pregnancy, unspecified trimester: Secondary | ICD-10-CM | POA: Diagnosis not present

## 2019-05-01 NOTE — Progress Notes (Signed)
ROB GBS/Aptima  C/o pelvic pressure, hip pain and LQ pain  Denies lof, no vb Good FM

## 2019-05-01 NOTE — Progress Notes (Addendum)
    Routine Prenatal Care Visit  Subjective  Mallory Craig is a 28 y.o. [phone] at [redacted]w[redacted]d being seen today for ongoing prenatal care.  She is currently monitored for the following issues for this low-risk pregnancy and has Anxiety and depression; Supervision of other normal pregnancy, antepartum; Family history of genetic disorder; and Labor and delivery indication for care or intervention on their problem list.  ----------------------------------------------------------------------------------- Patient reports no complaints.   Contractions: Not present. Vag. Bleeding: None.  Movement: Present. Denies leaking of fluid.  ----------------------------------------------------------------------------------- The following portions of the patient's history were reviewed and updated as appropriate: allergies, current medications, past family history, past medical history, past social history, past surgical history and problem list. Problem list updated.   Objective  Blood pressure 110/64, weight 179 lb (81.2 kg), last menstrual period 08/24/2018. Pregravid weight 151 lb (68.5 kg) Total Weight Gain 28 lb (12.7 kg) Urinalysis:      Fetal Status: Fetal Heart Rate (bpm): 135 Fundal Height: 34 cm Movement: Present     General:  Alert, oriented and cooperative. Patient is in no acute distress.  Skin: Skin is warm and dry. No rash noted.   Cardiovascular: Normal heart rate noted  Respiratory: Normal respiratory effort, no problems with respiration noted  Abdomen: Soft, gravid, appropriate for gestational age. Pain/Pressure: Absent     Pelvic:  Cervical exam deferred        Extremities: Normal range of motion.     Mental Status: Normal mood and affect. Normal behavior. Normal judgment and thought content.     Assessment   28 y.o. [id number] at [redacted]w[redacted]d by  05/27/2019, by Ultrasound presenting for routine prenatal visit  Plan   FIFTH Problems (from 11/06/18 to present)    Problem Noted Resolved   Labor and delivery indication for care or intervention 04/19/2019 by Will Bonnet, MD No   Supervision of other normal pregnancy, antepartum 03/23/2015 by Morene Crocker, CNM No   Overview Addendum 04/16/2019  3:44 PM by Rexene Agent, [place] Prenatal Labs  Dating LMP = 12 week [place] Blood type: B/Positive/-- (05/13 1553)   Genetic Screen NIPS:Normal XY Antibody:Negative (05/13 1553)  Anatomic [place] Normal Rubella: 1.53 (05/13 1553) Varicella: Non-Immune  GTT Third trimester: 86 RPR: Non Reactive (05/13 1553)   Rhogam N/A HBsAg: Negative (05/13 1553)   TDaP vaccine  9/30 Flu Shot: 10/15 HIV: Non Reactive (05/13 1553)   Baby Food Breast                               GBS:   Contraception Considering BTL Pap: 03/04/2015 NIL  CBB  no   CS/VBAC NA   Support Person Husband Timmothy Sours              Gestational age appropriate obstetric precautions including but not limited to vaginal bleeding, contractions, leaking of fluid and fetal movement were reviewed in detail with the patient.    GBS/ GC/ CT today Growth/ AFI today Patient would like 39 week IOL for hx of 26 week lost   Borderline low fluid (6.8 cm, MVP 3cm) repeat AFI 1 week.  Return in about 1 week (around 05/08/2019) for ROB.  Homero Fellers MD [facility], [facility] 05/01/2019, 3:47 PM

## 2019-05-01 NOTE — Addendum Note (Signed)
Addended by: Adrian Prows on: 05/01/2019 04:33 PM   Modules accepted: Orders

## 2019-05-04 ENCOUNTER — Encounter: Payer: Self-pay | Admitting: Obstetrics and Gynecology

## 2019-05-05 LAB — CERVICOVAGINAL ANCILLARY ONLY
Chlamydia: NEGATIVE
Comment: NEGATIVE
Comment: NORMAL
Neisseria Gonorrhea: NEGATIVE

## 2019-05-05 LAB — CULTURE, BETA STREP (GROUP B ONLY): Strep Gp B Culture: NEGATIVE

## 2019-05-06 NOTE — Progress Notes (Signed)
WNL

## 2019-05-08 ENCOUNTER — Encounter: Payer: Self-pay | Admitting: Certified Nurse Midwife

## 2019-05-08 ENCOUNTER — Ambulatory Visit (INDEPENDENT_AMBULATORY_CARE_PROVIDER_SITE_OTHER): Payer: Medicaid Other

## 2019-05-08 ENCOUNTER — Ambulatory Visit (INDEPENDENT_AMBULATORY_CARE_PROVIDER_SITE_OTHER): Payer: Medicaid Other | Admitting: Certified Nurse Midwife

## 2019-05-08 ENCOUNTER — Other Ambulatory Visit: Payer: Self-pay

## 2019-05-08 VITALS — BP 100/70 | Wt 177.0 lb

## 2019-05-08 DIAGNOSIS — Z3480 Encounter for supervision of other normal pregnancy, unspecified trimester: Secondary | ICD-10-CM

## 2019-05-08 DIAGNOSIS — Z3A37 37 weeks gestation of pregnancy: Secondary | ICD-10-CM

## 2019-05-08 DIAGNOSIS — Z8759 Personal history of other complications of pregnancy, childbirth and the puerperium: Secondary | ICD-10-CM

## 2019-05-08 DIAGNOSIS — Z3689 Encounter for other specified antenatal screening: Secondary | ICD-10-CM

## 2019-05-08 DIAGNOSIS — O26843 Uterine size-date discrepancy, third trimester: Secondary | ICD-10-CM

## 2019-05-08 DIAGNOSIS — O09293 Supervision of pregnancy with other poor reproductive or obstetric history, third trimester: Secondary | ICD-10-CM | POA: Diagnosis not present

## 2019-05-08 LAB — POCT URINALYSIS DIPSTICK OB
Glucose, UA: NEGATIVE
POC,PROTEIN,UA: NEGATIVE

## 2019-05-08 NOTE — Patient Instructions (Signed)
P 

## 2019-05-08 NOTE — Progress Notes (Signed)
No concerns.rj 

## 2019-05-15 ENCOUNTER — Other Ambulatory Visit: Payer: Self-pay

## 2019-05-15 ENCOUNTER — Other Ambulatory Visit: Payer: Self-pay | Admitting: Obstetrics and Gynecology

## 2019-05-15 ENCOUNTER — Ambulatory Visit (INDEPENDENT_AMBULATORY_CARE_PROVIDER_SITE_OTHER): Payer: Medicaid Other | Admitting: Obstetrics and Gynecology

## 2019-05-15 ENCOUNTER — Encounter: Payer: Self-pay | Admitting: Obstetrics and Gynecology

## 2019-05-15 ENCOUNTER — Other Ambulatory Visit (INDEPENDENT_AMBULATORY_CARE_PROVIDER_SITE_OTHER): Payer: Medicaid Other

## 2019-05-15 VITALS — BP 130/80 | Wt 181.0 lb

## 2019-05-15 DIAGNOSIS — Z3A38 38 weeks gestation of pregnancy: Secondary | ICD-10-CM | POA: Diagnosis not present

## 2019-05-15 DIAGNOSIS — F419 Anxiety disorder, unspecified: Secondary | ICD-10-CM | POA: Diagnosis not present

## 2019-05-15 DIAGNOSIS — Z3689 Encounter for other specified antenatal screening: Secondary | ICD-10-CM

## 2019-05-15 DIAGNOSIS — O99343 Other mental disorders complicating pregnancy, third trimester: Secondary | ICD-10-CM

## 2019-05-15 DIAGNOSIS — F329 Major depressive disorder, single episode, unspecified: Secondary | ICD-10-CM

## 2019-05-15 DIAGNOSIS — F32A Depression, unspecified: Secondary | ICD-10-CM

## 2019-05-15 DIAGNOSIS — O09293 Supervision of pregnancy with other poor reproductive or obstetric history, third trimester: Secondary | ICD-10-CM

## 2019-05-15 DIAGNOSIS — Z8759 Personal history of other complications of pregnancy, childbirth and the puerperium: Secondary | ICD-10-CM | POA: Insufficient documentation

## 2019-05-15 DIAGNOSIS — Z3480 Encounter for supervision of other normal pregnancy, unspecified trimester: Secondary | ICD-10-CM

## 2019-05-15 NOTE — Progress Notes (Signed)
Routine Prenatal Care Visit  Subjective  Mallory Craig is a 28 y.o. [phone] at [redacted]w[redacted]d being seen today for ongoing prenatal care.  She is currently monitored for the following issues for this high-risk pregnancy and has Anxiety and depression; Supervision of other normal pregnancy, antepartum; Family history of genetic disorder; and History of IUFD on their problem list.  ----------------------------------------------------------------------------------- Patient reports no complaints.   Contractions: Irregular. Vag. Bleeding: None.  Movement: Present. Leaking Fluid denies.  Denies HA, visual changes, and RUQ pain. AFI 11 cm today. ----------------------------------------------------------------------------------- The following portions of the patient's history were reviewed and updated as appropriate: allergies, current medications, past family history, past medical history, past social history, past surgical history and problem list. Problem list updated.  Objective  Blood pressure 130/80, weight 181 lb (82.1 kg), last menstrual period 08/24/2018, unknown if currently breastfeeding. Initial BP 142/90, repeat #1 136/88 Pregravid weight 151 lb (68.5 kg) Total Weight Gain 30 lb (13.6 kg) Urinalysis: Urine Protein    Urine Glucose    Fetal Status: Fetal Heart Rate (bpm): 125   Movement: Present  Presentation: Vertex  General:  Alert, oriented and cooperative. Patient is in no acute distress.  Skin: Skin is warm and dry. No rash noted.   Cardiovascular: Normal heart rate noted  Respiratory: Normal respiratory effort, no problems with respiration noted  Abdomen: Soft, gravid, appropriate for gestational age. Pain/Pressure: Present     Pelvic:  Cervical exam performed Dilation: 3 Effacement (%): 70 Station: -2  Extremities: Normal range of motion.  Edema: None  Mental Status: Normal mood and affect. Normal behavior. Normal judgment and thought content.   NST: Baseline FHR: 125  beats/min Variability: moderate Accelerations: present Decelerations: absent Tocometry: not done  Interpretation:  INDICATIONS: history of previous fetal demise RESULTS:  A NST procedure was performed with FHR monitoring and a normal baseline established, appropriate time of 20-40 minutes of evaluation, and accels >2 seen w 15x15 characteristics.  Results show a REACTIVE NST.    Imaging Results US Ob Limited  Result Date: 05/15/2019 Patient Name: Mallory Craig DOB: 11/21/1990 MRN: [id number] ULTRASOUND REPORT Location: [facility] Date of Service: 05/15/2019 Indications:AFI Findings: Mason Jim intrauterine pregnancy is visualized with FHR at 134 BPM. Fetal presentation is Cephalic. Placenta: anterior. Grade: 2 AFI: 11.7 cm Impression: 1. [redacted]w[redacted]d Viable Singleton Intrauterine pregnancy dated by previously established criteria. 2. AFI is 11.7 cm. Deanna Artis, RT The ultrasound images and findings were reviewed by me and I agree with the above report. Thomasene Mohair, MD, [facility], [facility] 05/15/2019 3:17 PM       Assessment   28 y.o. [id number] at [redacted]w[redacted]d by  05/27/2019, by Ultrasound presenting for routine prenatal visit  Plan   FIFTH Problems (from 11/06/18 to present)    Problem Noted Resolved   History of IUFD 05/15/2019 by Conard Novak, MD No   Overview Signed 05/15/2019  3:22 PM by Conard Novak, MD    At 26 weeks       Supervision of other normal pregnancy, antepartum 03/23/2015 by Roe Coombs, CNM No   Overview Addendum 04/16/2019  3:44 PM by Oswaldo Conroy, CNM    Clinic [facility] Prenatal Labs  Dating LMP = 12 week [place] Blood type: B/Positive/-- (05/13 1553)   Genetic Screen NIPS:Normal XY Antibody:Negative (05/13 1553)  Anatomic [place] Normal Rubella: 1.53 (05/13 1553) Varicella: Non-Immune  GTT Third trimester: 86 RPR: Non Reactive (05/13 1553)   Rhogam N/A HBsAg: Negative (05/13 1553)  TDaP vaccine  9/30 Flu Shot:  10/15 HIV: Non Reactive (05/13 1553)   Baby Food Breast                               GBS:   Contraception Considering BTL Pap: 03/04/2015 NIL  CBB  no   CS/VBAC NA   Support Person Husband Deeann Saint and delivery indication for care or intervention 04/19/2019 by Will Bonnet, MD 05/15/2019 by Will Bonnet, MD       Term labor symptoms and general obstetric precautions including but not limited to vaginal bleeding, contractions, leaking of fluid and fetal movement were reviewed in detail with the patient. Please refer to After Visit Summary for other counseling recommendations.   Return in about 3 days (around 05/18/2019) for BP check; , Routine Prenatal Appointment.  Prentice Docker, MD, [facility], [facility] 05/15/2019 3:50 PM

## 2019-05-16 ENCOUNTER — Other Ambulatory Visit: Payer: Self-pay

## 2019-05-16 ENCOUNTER — Inpatient Hospital Stay
Admission: EM | Admit: 2019-05-16 | Discharge: 2019-05-18 | DRG: 768 | Disposition: A | Discharge status: Home / Self Care | Payer: Medicaid Other | Attending: Obstetrics and Gynecology | Admitting: Obstetrics and Gynecology

## 2019-05-16 DIAGNOSIS — Z20828 Contact with and (suspected) exposure to other viral communicable diseases: Secondary | ICD-10-CM | POA: Diagnosis present

## 2019-05-16 DIAGNOSIS — D62 Acute posthemorrhagic anemia: Secondary | ICD-10-CM | POA: Diagnosis not present

## 2019-05-16 DIAGNOSIS — O9081 Anemia of the puerperium: Secondary | ICD-10-CM | POA: Diagnosis not present

## 2019-05-16 DIAGNOSIS — Z3480 Encounter for supervision of other normal pregnancy, unspecified trimester: Secondary | ICD-10-CM

## 2019-05-16 DIAGNOSIS — F329 Major depressive disorder, single episode, unspecified: Secondary | ICD-10-CM | POA: Diagnosis present

## 2019-05-16 DIAGNOSIS — Z3A38 38 weeks gestation of pregnancy: Secondary | ICD-10-CM

## 2019-05-16 DIAGNOSIS — Z8759 Personal history of other complications of pregnancy, childbirth and the puerperium: Secondary | ICD-10-CM

## 2019-05-16 DIAGNOSIS — F32A Depression, unspecified: Secondary | ICD-10-CM | POA: Diagnosis present

## 2019-05-17 ENCOUNTER — Inpatient Hospital Stay: Payer: Medicaid Other | Admitting: Anesthesiology

## 2019-05-17 DIAGNOSIS — O9081 Anemia of the puerperium: Secondary | ICD-10-CM | POA: Diagnosis not present

## 2019-05-17 DIAGNOSIS — Z20828 Contact with and (suspected) exposure to other viral communicable diseases: Secondary | ICD-10-CM | POA: Diagnosis present

## 2019-05-17 DIAGNOSIS — Z3A38 38 weeks gestation of pregnancy: Secondary | ICD-10-CM | POA: Diagnosis not present

## 2019-05-17 DIAGNOSIS — D62 Acute posthemorrhagic anemia: Secondary | ICD-10-CM | POA: Diagnosis not present

## 2019-05-17 DIAGNOSIS — O26893 Other specified pregnancy related conditions, third trimester: Secondary | ICD-10-CM | POA: Diagnosis present

## 2019-05-17 LAB — CBC
HCT: 36.4 % (ref 36.0–46.0)
Hemoglobin: 13 g/dL (ref 12.0–15.0)
MCH: 30.8 pg (ref 26.0–34.0)
MCHC: 35.7 g/dL (ref 30.0–36.0)
MCV: 86.3 fL (ref 80.0–100.0)
Platelets: 174 10*3/uL (ref 150–400)
RBC: 4.22 MIL/uL (ref 3.87–5.11)
RDW: 13.3 % (ref 11.5–15.5)
WBC: 12.6 10*3/uL — ABNORMAL HIGH (ref 4.0–10.5)
nRBC: 0 % (ref 0.0–0.2)

## 2019-05-17 LAB — TYPE AND SCREEN
ABO/RH(D): B POS
Antibody Screen: NEGATIVE

## 2019-05-17 LAB — SARS CORONAVIRUS 2 BY RT PCR (HOSPITAL ORDER, PERFORMED IN ~~LOC~~ HOSPITAL LAB): SARS Coronavirus 2: NEGATIVE

## 2019-05-17 LAB — RPR: RPR Ser Ql: NONREACTIVE

## 2019-05-17 MED ORDER — ONDANSETRON HCL 4 MG/2ML IJ SOLN: 4.0000 mg | INTRAMUSCULAR | Status: DC | PRN

## 2019-05-17 MED ORDER — FENTANYL 2.5 MCG/ML W/ROPIVACAINE 0.15% IN NS 100 ML EPIDURAL (ARMC)
12.0000 mL/h | EPIDURAL | Status: DC
  Administered 2019-05-17: 12 mL/h via EPIDURAL

## 2019-05-17 MED ORDER — DIPHENHYDRAMINE HCL 25 MG PO CAPS: 25.0000 mg | ORAL_CAPSULE | Freq: Four times a day (QID) | ORAL | Status: DC | PRN

## 2019-05-17 MED ORDER — LIDOCAINE HCL (PF) 1 % IJ SOLN
30.0000 mL | INTRAMUSCULAR | Status: DC | PRN
  Filled 2019-05-17: qty 30

## 2019-05-17 MED ORDER — OXYTOCIN BOLUS FROM INFUSION
500.0000 mL | Freq: Once | INTRAVENOUS | Status: AC
  Administered 2019-05-17: 500 mL via INTRAVENOUS

## 2019-05-17 MED ORDER — LIDOCAINE HCL (PF) 1 % IJ SOLN
INTRAMUSCULAR | Status: DC | PRN
  Administered 2019-05-17: 1 mL

## 2019-05-17 MED ORDER — AMMONIA AROMATIC IN INHA
RESPIRATORY_TRACT | Status: AC
  Filled 2019-05-17: qty 10

## 2019-05-17 MED ORDER — ONDANSETRON HCL 4 MG PO TABS: 4.0000 mg | ORAL_TABLET | ORAL | Status: DC | PRN

## 2019-05-17 MED ORDER — DIPHENHYDRAMINE HCL 50 MG/ML IJ SOLN: 12.5000 mg | INTRAMUSCULAR | Status: DC | PRN

## 2019-05-17 MED ORDER — FENTANYL CITRATE (PF) 100 MCG/2ML IJ SOLN
50.0000 ug | INTRAMUSCULAR | Status: DC | PRN
  Administered 2019-05-17: 50 ug via INTRAVENOUS
  Filled 2019-05-17: qty 2

## 2019-05-17 MED ORDER — ACETAMINOPHEN 325 MG PO TABS: 650.0000 mg | ORAL_TABLET | ORAL | Status: DC | PRN

## 2019-05-17 MED ORDER — BENZOCAINE-MENTHOL 20-0.5 % EX AERO: 1.0000 "application " | INHALATION_SPRAY | CUTANEOUS | Status: DC | PRN

## 2019-05-17 MED ORDER — OXYTOCIN 40 UNITS IN NORMAL SALINE INFUSION - SIMPLE MED
2.5000 [IU]/h | INTRAVENOUS | Status: DC
  Administered 2019-05-17: 2.5 [IU]/h via INTRAVENOUS

## 2019-05-17 MED ORDER — DIBUCAINE (PERIANAL) 1 % EX OINT: 1.0000 "application " | TOPICAL_OINTMENT | CUTANEOUS | Status: DC | PRN

## 2019-05-17 MED ORDER — OXYTOCIN 10 UNIT/ML IJ SOLN
INTRAMUSCULAR | Status: AC
  Filled 2019-05-17: qty 2

## 2019-05-17 MED ORDER — OXYTOCIN 10 UNIT/ML IJ SOLN: 10.0000 [IU] | Freq: Once | INTRAMUSCULAR | Status: DC

## 2019-05-17 MED ORDER — PHENYLEPHRINE 40 MCG/ML (10ML) SYRINGE FOR IV PUSH (FOR BLOOD PRESSURE SUPPORT)
80.0000 ug | PREFILLED_SYRINGE | INTRAVENOUS | Status: DC | PRN
  Filled 2019-05-17: qty 10

## 2019-05-17 MED ORDER — LACTATED RINGERS IV SOLN
INTRAVENOUS | Status: DC
  Administered 2019-05-17 (×3): via INTRAVENOUS

## 2019-05-17 MED ORDER — FENTANYL 2.5 MCG/ML W/ROPIVACAINE 0.15% IN NS 100 ML EPIDURAL (ARMC)
EPIDURAL | Status: AC
  Administered 2019-05-17: 250 ug
  Filled 2019-05-17: qty 100

## 2019-05-17 MED ORDER — WITCH HAZEL-GLYCERIN EX PADS: 1.0000 "application " | MEDICATED_PAD | CUTANEOUS | Status: DC | PRN

## 2019-05-17 MED ORDER — HYDROCODONE-ACETAMINOPHEN 5-325 MG PO TABS: 1.0000 | ORAL_TABLET | ORAL | Status: DC | PRN

## 2019-05-17 MED ORDER — MISOPROSTOL 200 MCG PO TABS
ORAL_TABLET | ORAL | Status: AC
  Filled 2019-05-17: qty 4

## 2019-05-17 MED ORDER — IBUPROFEN 600 MG PO TABS: 600.0000 mg | ORAL_TABLET | Freq: Four times a day (QID) | ORAL | Status: DC

## 2019-05-17 MED ORDER — EPHEDRINE 5 MG/ML INJ
10.0000 mg | INTRAVENOUS | Status: DC | PRN
  Filled 2019-05-17: qty 2

## 2019-05-17 MED ORDER — COCONUT OIL OIL: 1.0000 "application " | TOPICAL_OIL | Status: DC | PRN

## 2019-05-17 MED ORDER — SIMETHICONE 80 MG PO CHEW: 80.0000 mg | CHEWABLE_TABLET | ORAL | Status: DC | PRN

## 2019-05-17 MED ORDER — SOD CITRATE-CITRIC ACID 500-334 MG/5ML PO SOLN: 30.0000 mL | ORAL | Status: DC | PRN

## 2019-05-17 MED ORDER — LACTATED RINGERS IV SOLN
500.0000 mL | Freq: Once | INTRAVENOUS | Status: AC
  Administered 2019-05-17: 500 mL via INTRAVENOUS

## 2019-05-17 MED ORDER — LACTATED RINGERS IV SOLN: 500.0000 mL | INTRAVENOUS | Status: DC | PRN

## 2019-05-17 MED ORDER — ONDANSETRON HCL 4 MG/2ML IJ SOLN: 4.0000 mg | Freq: Four times a day (QID) | INTRAMUSCULAR | Status: DC | PRN

## 2019-05-17 MED ORDER — VARICELLA VIRUS VACCINE LIVE 1350 PFU/0.5ML IJ SUSR
0.5000 mL | INTRAMUSCULAR | Status: AC | PRN
  Administered 2019-05-18: 0.5 mL via SUBCUTANEOUS
  Filled 2019-05-17 (×2): qty 0.5

## 2019-05-17 MED ORDER — SENNOSIDES-DOCUSATE SODIUM 8.6-50 MG PO TABS
2.0000 | ORAL_TABLET | ORAL | Status: DC
  Administered 2019-05-18: 2 via ORAL
  Filled 2019-05-17: qty 2

## 2019-05-17 MED ORDER — OXYTOCIN 40 UNITS IN NORMAL SALINE INFUSION - SIMPLE MED
1.0000 m[IU]/min | INTRAVENOUS | Status: DC
  Administered 2019-05-17: 1 m[IU]/min via INTRAVENOUS
  Filled 2019-05-17: qty 1000

## 2019-05-17 MED ORDER — FERROUS SULFATE 325 (65 FE) MG PO TABS
325.0000 mg | ORAL_TABLET | Freq: Two times a day (BID) | ORAL | Status: DC
  Administered 2019-05-18 (×2): 325 mg via ORAL
  Filled 2019-05-17 (×2): qty 1

## 2019-05-17 MED ORDER — TERBUTALINE SULFATE 1 MG/ML IJ SOLN: 0.2500 mg | Freq: Once | INTRAMUSCULAR | Status: DC | PRN

## 2019-05-17 MED ORDER — IBUPROFEN 600 MG PO TABS
600.0000 mg | ORAL_TABLET | Freq: Four times a day (QID) | ORAL | Status: DC
  Administered 2019-05-17 – 2019-05-18 (×5): 600 mg via ORAL
  Filled 2019-05-17 (×5): qty 1

## 2019-05-17 MED ORDER — PRENATAL MULTIVITAMIN CH
1.0000 | ORAL_TABLET | Freq: Every day | ORAL | Status: DC
  Administered 2019-05-18: 1 via ORAL
  Filled 2019-05-17: qty 1

## 2019-05-17 MED ORDER — SODIUM CHLORIDE 0.9 % IV SOLN
INTRAVENOUS | Status: DC | PRN
  Administered 2019-05-17 (×3): 5 mL via EPIDURAL

## 2019-05-17 NOTE — OB Triage Note (Signed)
Patient came in for observation for labor evaluation. Patient reports contractions since yesterday evening at 1500. Patient denies leaking of fluid but denies vaginal bleeding and spotting. Patient reports +FM upon arrival. Patient  Vital signs stable and patient afebrile. FHR baseline 110 with moderate variability with accelerations 15 x 15 and no decelerations. Significant other at bedside. Will continue to monitor.

## 2019-05-17 NOTE — Discharge Summary (Signed)
OB Discharge Summary     Patient Name: Mallory Craig DOB: 06-15-1991 MRN: [id number]  Date of admission: 05/16/2019 Delivering MD: Prentice Docker, MD  Date of Delivery: 05/17/2019  Date of discharge: 05/18/2019  Admitting diagnosis: 38 wks preg contractions Intrauterine pregnancy: [redacted]w[redacted]d     Secondary diagnosis: None     Discharge diagnosis: Term Pregnancy Delivered                                                                                                Post partum procedures: none  Augmentation: AROM and Pitocin  Complications: None  Hospital course:  Onset of Labor With Vaginal Delivery     28 y.o. yo [id number] at [redacted]w[redacted]d was admitted in Active Labor on 05/16/2019. Patient had an uncomplicated labor course as follows:  Membrane Rupture Time/Date: 1:00 PM ,05/17/2019   Intrapartum Procedures: Episiotomy: None [1]                                         Lacerations:  1st degree [2];Cervical [7]  Patient had a delivery of a Viable infant. 05/17/2019  Information for the patient's newborn:  Naidelyn, Parrella [[id number]]  Delivery Method: Vag-Vacuum     Pateint had an uncomplicated postpartum course.  She is ambulating, tolerating a regular diet, passing flatus, and urinating well. Patient is discharged home in stable condition on 05/18/19.   Physical exam  Vitals:   05/18/19 0041 05/18/19 0359 05/18/19 0848 05/18/19 1531  BP: 107/60 118/70 108/67 127/64  Pulse: 64 64 70 (!) 56  Resp: 20 20 20 18   Temp: 97.6 F (36.4 C) (!) 97.5 F (36.4 C) (!) 97.4 F (36.3 C) (!) 97.5 F (36.4 C)  TempSrc: Oral Oral Oral Oral  SpO2: 99% 100% 100% 99%  Weight:      Height:       General: alert, cooperative and no distress Lochia: appropriate Uterine Fundus: firm Incision: N/A DVT Evaluation: No evidence of DVT seen on physical exam.  Labs: Lab Results  Component Value Date   WBC 13.4 (H) 05/18/2019   HGB 11.9 (L) 05/18/2019   HCT 34.2 (L) 05/18/2019   MCV 87.7  05/18/2019   PLT 167 05/18/2019    Discharge instruction: per After Visit Summary.  Medications:  Allergies as of 05/18/2019      Reactions   Tape Hives   Had an epidural and had a piece of tape used and caused hives.   Bee Venom Swelling      Medication List    TAKE these medications   acetaminophen 500 MG tablet Commonly known as: TYLENOL Take 500 mg by mouth every 6 (six) hours as needed.   Prenatal Vitamins 28-0.8 MG Tabs Take 1 tablet by mouth daily. Target brand       Diet: routine diet  Activity: Advance as tolerated. Pelvic rest for 6 weeks.   Outpatient follow up: Follow-up Information    Will Bonnet, MD. Schedule an appointment as soon as possible for a visit  on 06/02/2019.   Specialty: Obstetrics and Gynecology Why: Postpartum follow up at 11:30 am Contact information: [address] [place] [address] [phone]             Postpartum contraception: Nexplanon vs Depo Rhogam Given postpartum: no Rubella vaccine given postpartum: no Varicella vaccine given postpartum: yes TDaP given antepartum or postpartum: 04/01/2019  Newborn Data: Live born female Dorinda Hill Birth Weight:  3420 g APGAR: 8, 9  Newborn Delivery   Birth date/time: 05/17/2019 16:28:00 Delivery type: Vaginal, Vacuum (Extractor)       Baby Feeding: Breast  Disposition:home with mother  SIGNED: Tresea Mall, CNM

## 2019-05-17 NOTE — Anesthesia Preprocedure Evaluation (Signed)
Anesthesia Evaluation  Patient identified by MRN, date of birth, ID band Patient awake    Reviewed: Allergy & Precautions, H&P , NPO status , Patient's Chart, lab work & pertinent test results  Airway Mallampati: III  TM Distance: >3 FB Neck ROM: full    Dental  (+) Chipped, Teeth Intact   Pulmonary asthma (pt denies) ,           Cardiovascular Exercise Tolerance: Good (-) hypertensionnegative cardio ROS       Neuro/Psych PSYCHIATRIC DISORDERS Anxiety Depression    GI/Hepatic negative GI ROS,   Endo/Other    Renal/GU   negative genitourinary   Musculoskeletal   Abdominal   Peds  Hematology negative hematology ROS (+)   Anesthesia Other Findings Past Medical History: No date: Allergy     Comment:  Claritin No date: Anxiety No date: Asthma     Comment:  Childhood; last Albuterol use age 28 No date: Depression     Comment:  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 first child  Past Surgical History: No date: ADENOIDECTOMY No date: WISDOM TOOTH EXTRACTION  BMI    Body Mass Index: 28.66 kg/m      Reproductive/Obstetrics (+) Pregnancy                             Anesthesia Physical Anesthesia Plan  ASA: II  Anesthesia Plan: Epidural   Post-op Pain Management:    Induction:   PONV Risk Score and Plan:   Airway Management Planned:   Additional Equipment:   Intra-op Plan:   Post-operative Plan:   Informed Consent: I have reviewed the patients History and Physical, chart, labs and discussed the procedure including the risks, benefits and alternatives for the proposed anesthesia with the patient or authorized representative who has indicated his/her understanding and acceptance.       Plan Discussed with: Anesthesiologist  Anesthesia Plan Comments:         Anesthesia Quick Evaluation

## 2019-05-17 NOTE — Progress Notes (Signed)
Labor Check  Subj:  Complaints: feeling more pressure   Obj:  BP (!) 103/54   Pulse 66   Temp 97.6 F (36.4 C) (Oral)   Resp 18   Ht 5\' 7"  (1.702 m)   Wt 83 kg   LMP 08/24/2018 (Exact Date)   SpO2 99%   BMI 28.66 kg/m  Dose (milli-units/min) Oxytocin: 15 milli-units/min  Cervix: Dilation: 8 cm (more anterior lip) / Effacement (%): 80 / Station: -1   Fetal position: ROA Baseline FHR: 120 beats/min   Variability: moderate   Accelerations: present   Decelerations: absent Contractions: present frequency: 3-4 q 10 min Overall assessment: cat 1  A/P: 28 y.o. [id number] female at [redacted]w[redacted]d with normal labor.  1.  Labor: continue pitocin per protocol  2.  FWB: reassuring, Overall assessment: category 1  3.  GBS negative  4.  Pain: epidural 5.  Recheck: 2 hours or PRN - expect vaginal delivery soon  Prentice Docker, MD, [place], [facility] 05/17/2019 3:21 PM

## 2019-05-17 NOTE — H&P (Signed)
OB History & Physical   History of Present Illness:  Chief Complaint: regular uterine contractions  HPI:  Mallory Craig is a 28 y.o. [phone] female at 2511w4d dated by a 12 week ultrasound.  Her pregnancy has been complicated by a history of fetal demise at 26 weeks.    She reports contractions.   She denies leakage of fluid.   She denies vaginal bleeding.   She reports fetal movement.    Total weight gain for pregnancy: 14.5 kg   Obstetrical Problem List: FIFTH Problems (from 11/06/18 to present)    Problem Noted Resolved   History of IUFD 05/15/2019 by Conard NovakJackson, Laderrick Wilk D, MD No   Overview Signed 05/15/2019  3:22 PM by Conard NovakJackson, Marwah Disbro D, MD    At 26 weeks       Supervision of other normal pregnancy, antepartum 03/23/2015 by Roe Coombsenney, Rachelle A, CNM No   Overview Addendum 04/16/2019  3:44 PM by Oswaldo ConroySchmid, Jacelyn Y, CNM    Clinic [facility] Prenatal Labs  Dating LMP = 12 week US Blood type: B/Positive/-- (05/13 1553)   Genetic Screen NIPS:Normal XY Antibody:Negative (05/13 1553)  Anatomic US Normal Rubella: 1.53 (05/13 1553) Varicella: Non-Immune  GTT Third trimester: 86 RPR: Non Reactive (05/13 1553)   Rhogam N/A HBsAg: Negative (05/13 1553)   TDaP vaccine  9/30 Flu Shot: 10/15 HIV: Non Reactive (05/13 1553)   Baby Food Breast                               GBS:   Contraception Considering BTL Pap: 03/04/2015 NIL  CBB  no   CS/VBAC NA   Support Person Husband Passenger transport managerDon          Labor and delivery indication for care or intervention 04/19/2019 by Conard NovakJackson, Brianny Soulliere D, MD 05/15/2019 by Conard NovakJackson, Therasa Lorenzi D, MD       Maternal Medical History:   Past Medical History:  Diagnosis Date  . Allergy    Claritin  . Anxiety   . Asthma    Childhood; last Albuterol use age 28  . 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 ADENOIDECTOMY    . WISDOM TOOTH EXTRACTION      Allergies  Allergen Reactions  . Tape Hives   Had an epidural and had a piece of tape used and caused hives.  . Bee Venom Swelling    Prior to Admission medications   Medication Sig Start Date End Date Taking? Authorizing Provider  Prenatal Vit-Fe Fumarate-FA (PRENATAL VITAMINS) 28-0.8 MG TABS Take 1 tablet by mouth daily. Target brand   Yes [provider]  acetaminophen (TYLENOL) 500 MG tablet Take 500 mg by mouth every 6 (six) hours as needed.    [provider]    OB History  Gravida Para Term Preterm AB Living  5 4 3 1   3   SAB TAB Ectopic Multiple Live Births        0 3    # Outcome Date GA Lbr Len/2nd Weight Sex Delivery Anes PTL Lv  5 Current           4 Term 06/22/16 3861w6d 221:51 / 00:33 3480 g F Vag-Spont EPI  LIV     Birth Comments: none observed at delivery  3 Preterm 07/05/15 6740w3d / 01:46 822 g Genella MechM Vag-Spont EPI  FD  2 Term 09/02/13 311w0d  3374 g  F Vag-Spont None N LIV  1 Term 01/23/12 [redacted]w[redacted]d  2892 g F Vag-Spont EPI N LIV    Prenatal care site: [facility]  Social History: She  reports that she has never smoked. She has never used smokeless tobacco. She reports that she does not drink alcohol or use drugs.  Family History: family history includes Arthritis in her mother; Asthma in her brother; Cancer in her maternal grandfather, maternal grandmother, and mother; Hyperthyroidism in her mother.   Review of Systems:  Review of Systems  Constitutional: Negative.   HENT: Negative.   Eyes: Negative.   Respiratory: Negative.   Cardiovascular: Negative.   Gastrointestinal: Positive for abdominal pain (contractions). Negative for blood in stool, constipation, diarrhea, heartburn, melena, nausea and vomiting.  Genitourinary: Negative.   Musculoskeletal: Negative.   Skin: Negative.   Neurological: Negative.   Psychiatric/Behavioral: Negative.      Physical Exam:  BP 108/66 (BP Location: Right Arm)   Pulse 76   Temp 97.8 F (36.6 C) (Oral)   Resp 18   Ht 5\' 7"  (1.702 m)   Wt 83 kg   LMP  08/24/2018 (Exact Date)   BMI 28.66 kg/m   Physical Exam Constitutional:      General: She is in acute distress (mild).     Appearance: Normal appearance. She is well-developed.  Genitourinary:     Genitourinary Comments: 3 cm to 4 cm in 2 hours  HENT:     Head: Normocephalic and atraumatic.  Eyes:     General: No scleral icterus.    Conjunctiva/sclera: Conjunctivae normal.  Neck:     Musculoskeletal: Normal range of motion and neck supple.  Cardiovascular:     Rate and Rhythm: Normal rate and regular rhythm.     Heart sounds: No murmur. No friction rub. No gallop.   Pulmonary:     Effort: Pulmonary effort is normal. No respiratory distress.     Breath sounds: Normal breath sounds. No wheezing or rales.  Abdominal:     General: Bowel sounds are normal. There is no distension.     Palpations: Abdomen is soft. There is no mass.     Tenderness: There is no abdominal tenderness. There is no guarding or rebound.  Musculoskeletal: Normal range of motion.  Neurological:     General: No focal deficit present.     Mental Status: She is alert and oriented to person, place, and time.     Cranial Nerves: No cranial nerve deficit.  Skin:    General: Skin is warm and dry.     Findings: No erythema.  Psychiatric:        Mood and Affect: Mood normal.        Behavior: Behavior normal.        Judgment: Judgment normal.   Female chaperone present for pelvic exam:    Pertinent Results:  Prenatal Labs Blood type/Rh B positive  Antibody screen negative  Rubella Immune  Varicella Not immune    RPR NR  HBsAg negative  HIV negative  GC negative  Chlamydia negative  Genetic screening Diploid XY  1 hour GTT 86  3 hour GTT n/a  GBS negative on 05/01/2019   Baseline FHR: 115 beats/min   Variability: moderate   Accelerations: present   Decelerations: present (a single late deceleration to the 90s, with moderate variability and quick return to baseline. This followed a prolonged  contraction) Contractions: present frequency: 3 q 10 min Overall assessment: cat 1 overall, one instance of  category 2  No results found for: SARSCOV2NAA  Assessment:  Leelah Hanna is a 28 y.o. [phone] female at [redacted]w[redacted]d with normal labor.   Plan:  1. Admit to Labor & Delivery  2. CBC, T&S, Clrs, IVF 3. GBS negative.   4. Fetwal well-being: reassuring overall. Will continue to monitor closely 5. Expectant management of labor.     Prentice Docker, MD 05/17/2019 2:14 AM

## 2019-05-17 NOTE — Progress Notes (Signed)
Labor Check  Subj:  Complaints: comfortable with epidural   Obj:  BP (!) 92/59   Pulse 73   Temp 97.9 F (36.6 C) (Oral)   Resp 18   Ht 5\' 7"  (1.702 m)   Wt 83 kg   LMP 08/24/2018 (Exact Date)   SpO2 99%   BMI 28.66 kg/m  Dose (milli-units/min) Oxytocin: 9 milli-units/min  Cervix: Dilation: 5.5 / Effacement (%): 80 / Station: -2   AROM: clear fluid Baseline FHR: 120 beats/min   Variability: moderate   Accelerations: present   Decelerations: absent Contractions: present frequency: 3-4 q 10 min Overall assessment: cat 1  A/P: 28 y.o. [id number] female at [redacted]w[redacted]d with normal labor, .  1.  Labor: AROM, continue pitocin  2.  FWB: reassuring, Overall assessment: category 1  3.  GBS negative  4.  Pain: epidural 5.  Recheck: 2 hours or prn   Prentice Docker, MD, [place], [facility] 05/17/2019 1:06 PM

## 2019-05-17 NOTE — Anesthesia Procedure Notes (Signed)
Epidural Patient location during procedure: OB  Staffing Anesthesiologist: Durenda Hurt, MD Performed: anesthesiologist   Preanesthetic Checklist Completed: patient identified, site marked, surgical consent, pre-op evaluation, timeout performed, IV checked, risks and benefits discussed and monitors and equipment checked  Epidural Patient position: sitting Prep: ChloraPrep Patient monitoring: heart rate, continuous pulse ox and blood pressure Approach: midline Location: L4-L5 Injection technique: LOR saline  Needle:  Needle type: Tuohy  Needle gauge: 18 G Needle length: 9 cm and 9 Needle insertion depth: 4 cm Catheter type: closed end flexible Catheter size: 20 Guage Catheter at skin depth: 8.5 cm Test dose: negative and Other  Assessment Events: blood not aspirated, injection not painful, no injection resistance, negative IV test and no paresthesia  Additional Notes Negative aspiration.  Negative paresthesia on injection.  Dose given in divided aliquots.   Patient tolerated the insertion well without complications.Reason for block:procedure for pain

## 2019-05-17 NOTE — Plan of Care (Signed)
Transferred to room 351. Oriented to room, Best boy. Pt. V/O.

## 2019-05-18 ENCOUNTER — Encounter: Payer: Medicaid Other | Admitting: Certified Nurse Midwife

## 2019-05-18 LAB — CBC
HCT: 34.2 % — ABNORMAL LOW (ref 36.0–46.0)
Hemoglobin: 11.9 g/dL — ABNORMAL LOW (ref 12.0–15.0)
MCH: 30.5 pg (ref 26.0–34.0)
MCHC: 34.8 g/dL (ref 30.0–36.0)
MCV: 87.7 fL (ref 80.0–100.0)
Platelets: 167 10*3/uL (ref 150–400)
RBC: 3.9 MIL/uL (ref 3.87–5.11)
RDW: 13.4 % (ref 11.5–15.5)
WBC: 13.4 10*3/uL — ABNORMAL HIGH (ref 4.0–10.5)
nRBC: 0 % (ref 0.0–0.2)

## 2019-05-18 NOTE — Progress Notes (Signed)
Subjective:  Doing well postpartum day 1. She is tolerating regular diet. Her pain is controlled with PO medication. She is ambulating and voiding without difficulty. She reports breastfeeding is going well.   Objective:  Vital signs in last 24 hours: Temp:  [97.4 F (36.3 C)-98.3 F (36.8 C)] 97.4 F (36.3 C) (11/16 0848) Pulse Rate:  [64-117] 70 (11/16 0848) Resp:  [16-20] 20 (11/16 0848) BP: (97-136)/(54-71) 108/67 (11/16 0848) SpO2:  [98 %-100 %] 100 % (11/16 0848)    General: NAD Pulmonary: no increased work of breathing Abdomen: non-distended, non-tender, fundus firm at level of umbilicus Extremities: no edema, no erythema, no tenderness  Results for orders placed or performed during the hospital encounter of 05/16/19 (from the past 72 hour(s))  CBC     Status: Abnormal   Collection Time: 05/17/19  3:33 AM  Result Value Ref Range   WBC 12.6 (H) 4.0 - 10.5 K/uL   RBC 4.22 3.87 - 5.11 MIL/uL   Hemoglobin 13.0 12.0 - 15.0 g/dL   HCT 84.1 66.0 - 63.0 %   MCV 86.3 80.0 - 100.0 fL   MCH 30.8 26.0 - 34.0 pg   MCHC 35.7 30.0 - 36.0 g/dL   RDW 16.0 10.9 - 32.3 %   Platelets 174 150 - 400 K/uL   nRBC 0.0 0.0 - 0.2 %    Comment: Performed at [facility], [address] [place], [place] [address]  Type and screen [facility]     Status: None   Collection Time: 05/17/19  3:33 AM  Result Value Ref Range   ABO/RH(D) B POS    Antibody Screen NEG    Sample Expiration      05/20/2019,2359 Performed at [facility], [address] [place], [place] [address]   RPR     Status: None   Collection Time: 05/17/19  3:33 AM  Result Value Ref Range   RPR Ser Ql NON REACTIVE NON REACTIVE    Comment: Performed at [facility], [address], [place], [place] [address]  SARS Coronavirus 2 by RT PCR (hospital order, performed in [facility] hospital lab) Nasopharyngeal Nasopharyngeal Swab     Status: None   Collection Time: 05/17/19  3:33  AM   Specimen: Nasopharyngeal Swab  Result Value Ref Range   SARS Coronavirus 2 NEGATIVE NEGATIVE    Comment: (NOTE) If result is NEGATIVE SARS-CoV-2 target nucleic acids are NOT DETECTED. The SARS-CoV-2 RNA is generally detectable in upper and lower  respiratory specimens during the acute phase of infection. The lowest  concentration of SARS-CoV-2 viral copies this assay can detect is 250  copies / mL. A negative result does not preclude SARS-CoV-2 infection  and should not be used as the sole basis for treatment or other  patient management decisions.  A negative result may occur with  improper specimen collection / handling, submission of specimen other  than nasopharyngeal swab, presence of viral mutation(s) within the  areas targeted by this assay, and inadequate number of viral copies  (<250 copies / mL). A negative result must be combined with clinical  observations, patient history, and epidemiological information. If result is POSITIVE SARS-CoV-2 target nucleic acids are DETECTED. The SARS-CoV-2 RNA is generally detectable in upper and lower  respiratory specimens dur ing the acute phase of infection.  Positive  results are indicative of active infection with SARS-CoV-2.  Clinical  correlation with patient history and other diagnostic information is  necessary  to determine patient infection status.  Positive results do  not rule out bacterial infection or co-infection with other viruses. If result is PRESUMPTIVE POSTIVE SARS-CoV-2 nucleic acids MAY BE PRESENT.   A presumptive positive result was obtained on the submitted specimen  and confirmed on repeat testing.  While 2019 novel coronavirus  (SARS-CoV-2) nucleic acids may be present in the submitted sample  additional confirmatory testing may be necessary for epidemiological  and / or clinical management purposes  to differentiate between  SARS-CoV-2 and other Sarbecovirus currently known to infect humans.  If clinically  indicated additional testing with an alternate test  methodology [phone]) is advised. The SARS-CoV-2 RNA is generally  detectable in upper and lower respiratory sp ecimens during the acute  phase of infection. The expected result is Negative. Fact Sheet for Patients:  StrictlyIdeas.no Fact Sheet for Healthcare Providers: BankingDealers.co.za This test is not yet approved or cleared by the [place] FDA and has been authorized for detection and/or diagnosis of SARS-CoV-2 by FDA under an Emergency Use Authorization (EUA).  This EUA will remain in effect (meaning this test can be used) for the duration of the COVID-19 declaration under Section 564(b)(1) of the Act, 21 U.S.C. section 360bbb-3(b)(1), unless the authorization is terminated or revoked sooner. Performed at [facility], [address] [place], [place] [address]   CBC     Status: Abnormal   Collection Time: 05/18/19  5:58 AM  Result Value Ref Range   WBC 13.4 (H) 4.0 - 10.5 K/uL   RBC 3.90 3.87 - 5.11 MIL/uL   Hemoglobin 11.9 (L) 12.0 - 15.0 g/dL   HCT 34.2 (L) 36.0 - 46.0 %   MCV 87.7 80.0 - 100.0 fL   MCH 30.5 26.0 - 34.0 pg   MCHC 34.8 30.0 - 36.0 g/dL   RDW 13.4 11.5 - 15.5 %   Platelets 167 150 - 400 K/uL   nRBC 0.0 0.0 - 0.2 %    Comment: Performed at [facility], [address], [place], [place] [address]    Assessment:   28 y.o. [id number] postpartum day # 1  Plan:    1) Acute blood loss anemia - hemodynamically stable and asymptomatic - po ferrous sulfate  2) Blood Type --/--/B POS (11/15 3244) / Rubella 1.53 (05/13 1553) / Varicella Non-Immune  3) TDAP status up to date  4) Feeding plan breast  5)  Education given regarding options for contraception, as well as compatibility with breast feeding if applicable.  Patient plans on nexplanon or depo for contraception.  6) Disposition: continue current care   Rod Can, [facility] 05/18/2019, 1:11 PM

## 2019-05-18 NOTE — Clinical Social Work Maternal (Signed)
  CLINICAL SOCIAL WORK MATERNAL/CHILD NOTE  Patient Details  Name: Mallory Craig MRN: [id number] Date of Birth: [birth year]  Date:  05/18/2019  Clinical Social Worker Initiating Note:  Annamaria Boots LCSWA Date/Time: Initiated:  05/18/19/1400     Child's Name:      Biological Parents:  Mother, Father   Need for Interpreter:  None   Reason for Referral:  Other (Comment)(Edinburgh score)   Address:  [address] [place] [address]    Phone number:  [phone] (home)     Additional phone number:   Household Members/Support Persons (HM/SP):   Household Member/Support Person 1   HM/SP Name Relationship DOB or Age  HM/SP -Bacon    HM/SP -2        HM/SP -3        HM/SP -4        HM/SP -5        HM/SP -6        HM/SP -7        HM/SP -8          Natural Supports (not living in the home):      Professional Supports:     Employment: Unemployed   Type of Work:     Education:  Programmer, systems   Homebound arranged:    Museum/gallery curator Resources:  Medicaid   Other Resources:  [facility]   Cultural/Religious Considerations Which May Impact Care:    Strengths:  Ability to meet basic needs , Compliance with medical plan , Home prepared for child , Pediatrician chosen   Psychotropic Medications:         Pediatrician:    [place] area  Pediatrician List:   [facility]      Pediatrician Fax Number:    Risk Factors/Current Problems:  None   Cognitive State:  Alert    Mood/Affect:  Happy , Calm    CSW Assessment: CSW consulted for [place] scale. CSW spoke with patient regarding concerns and discharge plan. Patient lives with her spouse and other children. Patient reports that this is baby number 5. Patient has custody of all of her other children. Patient states that she has all necessary items for baby including  crib and car seat. Patient reports that she has had post partum depression with all of her children and knows what signs to watch for. Patient reports that she follows up with her OB/GYN and will notify them if she feels it necessary. Patient reports that she does not currently feel depressed or anxious. She denies any SI/HI. Patient plans to discharge home with husband and baby today. No further needs identified at this time.   CSW Plan/Description:  No Further Intervention Required/No Barriers to Discharge    Annamaria Boots, [place] 05/18/2019, 2:03 PM

## 2019-05-18 NOTE — Lactation Note (Signed)
This note was copied from a baby's chart. Lactation Consultation Note  Patient Name: Boy Kerensa Nicklas [id number] Date: 05/18/2019 Reason for consult: Initial assessment  LC and [place] intern in to see mom and baby Elenore Rota. Mom has an extensive breastfeeding history, with breastfeeding other 4 children for 32yr or more. Each breastfeeding experience began with sore nipples, as mom reports with this one, but no other concerns voiced.  [facility] intern provided mom with a review of breastfeeding basics information: newborn stomach size, feeding patterns, growth spurts/cluster feedings, early hunger cues. Encouraged skin to skin when possible for breastfeeding, emotional, and soothing benefits. Provided information for outpatient lactation consult services and breastfeeding support. Family does seek [facility] services, and mom is well versed in [facility] breastfeeding support availability.  Maternal Data Has patient been taught Hand Expression?: Yes Does the patient have breastfeeding experience prior to this delivery?: Yes  Feeding    LATCH Score                   Interventions Interventions: Breast feeding basics reviewed  Lactation Tools Discussed/Used     Consult Status Consult Status: Complete    Lavonia Drafts 05/18/2019, 3:23 PM

## 2019-05-18 NOTE — Progress Notes (Signed)
Patient discharged home with infant. Discharge instructions reviewed with patient. Patient verbalized understanding. Escorted out by staff. 

## 2019-05-18 NOTE — Discharge Instructions (Signed)
If there are any new medications, they have been ordered and will be available for pickup at the listed pharmacy on your way home from the hospital.  ° °Call office if you have any of the following: headache, visual changes, fever >101.0 F, chills, shortness of breath, breast concerns, excessive vaginal bleeding, incision drainage or problems, leg pain or redness, depression or any other concerns. If you have vaginal discharge with an odor, let your doctor know.  ° °It is normal to bleed for up to 6 weeks. You should not soak through more than 1 pad in 1 hour. If you have a blood clot larger than your fist with continued bleeding, call your doctor.  ° °Activity: Do not lift > 10 lbs for 6 weeks (do not lift anything heavier than your baby). °No intercourse, tampons, swimming pools, hot tubs, baths (only showers) for 6 weeks.  °No driving for 1-2 weeks. °Continue prenatal vitamin, especially if breastfeeding. °Increase calories and fluids (water) while breastfeeding.  ° °Your milk will come in, in the next couple of days (right now it is colostrum). You may have a slight fever when your milk comes in, but it should go away on its own.  If it does not, and rises above 101 F please call the doctor. You will also feel achy and your breasts will be firm. They will also start to leak. If you are breastfeeding, continue as you have been and you can pump/express milk for comfort.  ° °If you have too much milk, your breasts can become engorged, which could lead to mastitis. This is an infection of the milk ducts. It can be very painful and you will need to notify your doctor to obtain a prescription for antibiotics. You can also treat it with a shower or hot/cold compress.  ° °For concerns about your baby, please call your pediatrician.  °For breastfeeding concerns, the lactation consultant can be reached at [phone].  ° °Postpartum blues (feelings of happy one minute and sad another minute) are normal for the first few  weeks but if it gets worse let your doctor know.  ° °Congratulations! We enjoyed caring for you and your new bundle of joy!  °

## 2019-05-18 NOTE — Anesthesia Postprocedure Evaluation (Signed)
Anesthesia Post Note  Patient: Mallory Craig  Procedure(s) Performed: AN AD [place]  Patient location during evaluation: Mother Baby Anesthesia Type: Epidural Level of consciousness: awake and alert Pain management: pain level controlled Vital Signs Assessment: post-procedure vital signs reviewed and stable Respiratory status: spontaneous breathing, nonlabored ventilation and respiratory function stable Cardiovascular status: stable Postop Assessment: no headache, no backache and epidural receding Anesthetic complications: no     Last Vitals:  Vitals:   05/18/19 0041 05/18/19 0359  BP: 107/60 118/70  Pulse: 64 64  Resp: 20 20  Temp: 36.4 C (!) 36.4 C  SpO2: 99% 100%    Last Pain:  Vitals:   05/18/19 0400  TempSrc:   PainSc: 0-No pain                 Rolla Plate P

## 2019-05-21 NOTE — Progress Notes (Signed)
ROB/ NST/AFI at 37wk2d: Irregular contractions. Good FM. No vaginal bleeding or leakage of fluid AFI 25.6 cm/ cephalic NST reactive with baseline 135 and accelerations to 170s to 180, moderate variability Baby Donald/ breast Scheduled for IOL 20 May 799 (39 weeks) for hx of stillbirth Needs Covid 19 testing 16 November. Discussed induction process ROB/ NST/AFI in 1 week Labor precautions  Dalia Heading, CNM

## 2019-06-02 ENCOUNTER — Ambulatory Visit: Payer: Medicaid Other | Admitting: Obstetrics and Gynecology

## 2019-06-11 ENCOUNTER — Ambulatory Visit (INDEPENDENT_AMBULATORY_CARE_PROVIDER_SITE_OTHER): Payer: Medicaid Other | Admitting: Obstetrics and Gynecology

## 2019-06-11 ENCOUNTER — Other Ambulatory Visit: Payer: Self-pay

## 2019-06-11 ENCOUNTER — Encounter: Payer: Self-pay | Admitting: Obstetrics and Gynecology

## 2019-06-11 VITALS — BP 122/74 | Ht 67.0 in | Wt 164.0 lb

## 2019-06-11 DIAGNOSIS — F419 Anxiety disorder, unspecified: Secondary | ICD-10-CM

## 2019-06-11 MED ORDER — CITALOPRAM HYDROBROMIDE 10 MG PO TABS: 10.0000 mg | ORAL_TABLET | Freq: Every day | ORAL | 1 refills | Status: DC

## 2019-06-11 NOTE — Progress Notes (Signed)
Obstetrics & Gynecology Office Visit   Chief Complaint  Patient presents with  . Follow-up  . Anxiety    History of Present Illness: 28 y.o. [id number] female who presents four weeks postpartum for increased anxiety.  She noted that she started feeling more anxious around the 1 week mark.  She notes that she can start panicking unprovoked and it is hard for her to stop feeling anxious.  She states that her anxiety makes her feel like a failure.  This makes her start feeling even worse overall.  Denies SI/HI.  She states that she has a history of postpartum depression with G1 for which she took Zoloft.  With her other children she was able to not take anything as her symptoms improved over time.  She states that her anxiety is the main component and that her depression this time is no were near as bad as the other postpartum episodes.  She states that she developed these "shock" sensations with Zoloft. She stopped the medication and she continued to have the "shock" sensation for over a year. She is breastfeeding.    EPDS: 18  Past Medical History:  Diagnosis Date  . Allergy    Claritin  . Anxiety   . Asthma    Childhood; last Albuterol use age 28  . 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 ADENOIDECTOMY    . WISDOM TOOTH EXTRACTION      Gynecologic History: No LMP recorded.  Obstetric History: [id number]  Family History  Problem Relation Age of Onset  . Cancer Mother        skin cancer  . Arthritis Mother   . Hyperthyroidism Mother   . Cancer Maternal Grandmother        Lung  . Cancer Maternal Grandfather   . Asthma Brother     Social History   Socioeconomic History  . Marital status: Single    Spouse name: Not on file  . Number of children: Not on file  . Years of education: Not on file  . Highest education level: Not on file  Occupational History  . Not on file  Tobacco Use  . Smoking status:  Never Smoker  . Smokeless tobacco: Never Used  Substance and Sexual Activity  . Alcohol use: No    Alcohol/week: 0.0 standard drinks  . Drug use: No  . Sexual activity: Not Currently    Partners: Male    Birth control/protection: I.U.D.    Comment: still deciding  Other Topics Concern  . Not on file  Social History Narrative   Marital status: married x 4 years.  Happily married; no abuse      Children:  2 children (2, 1)      Lives: with husband, 2 children.      Employment:  Homemaker      Tobacco: none      Alcohol: 2 drinks per week      Drugs:  None      Exercise:  No formal exercise.     Social Determinants of Health   Financial Resource Strain:   . Difficulty of Paying Living Expenses: Not on file  Food Insecurity:   . Worried About Programme researcher, broadcasting/film/videounning Out of Food in the Last Year: Not on file  . Ran Out of Food in the Last Year: Not on file  Transportation Needs:   . Lack of Transportation (Medical): Not on file  .  Lack of Transportation (Non-Medical): Not on file  Physical Activity:   . Days of Exercise per Week: Not on file  . Minutes of Exercise per Session: Not on file  Stress:   . Feeling of Stress : Not on file  Social Connections:   . Frequency of Communication with Friends and Family: Not on file  . Frequency of Social Gatherings with Friends and Family: Not on file  . Attends Religious Services: Not on file  . Active Member of Clubs or Organizations: Not on file  . Attends Banker Meetings: Not on file  . Marital Status: Not on file  Intimate Partner Violence:   . Fear of Current or Ex-Partner: Not on file  . Emotionally Abused: Not on file  . Physically Abused: Not on file  . Sexually Abused: Not on file    Allergies  Allergen Reactions  . Tape Hives    Had an epidural and had a piece of tape used and caused hives.  . Bee Venom Swelling    Prior to Admission medications   Medication Sig Start Date End Date Taking? Authorizing Provider   acetaminophen (TYLENOL) 500 MG tablet Take 500 mg by mouth every 6 (six) hours as needed.    [provider]  Prenatal Vit-Fe Fumarate-FA (PRENATAL VITAMINS) 28-0.8 MG TABS Take 1 tablet by mouth daily. [employer] brand    [provider]    Review of Systems  Constitutional: Negative.   HENT: Negative.   Eyes: Negative.   Respiratory: Negative.   Cardiovascular: Negative.   Gastrointestinal: Negative.   Genitourinary: Negative.   Musculoskeletal: Negative.   Skin: Negative.   Neurological: Negative.   Psychiatric/Behavioral: Positive for depression. Negative for hallucinations, memory loss, substance abuse and suicidal ideas. The patient is nervous/anxious. The patient does not have insomnia.      Physical Exam BP 122/74   Ht 5\' 7"  (1.702 m)   Wt 164 lb (74.4 kg)   BMI 25.69 kg/m  No LMP recorded. Physical Exam Constitutional:      General: She is not in acute distress.    Appearance: Normal appearance.  HENT:     Head: Normocephalic and atraumatic.  Eyes:     General: No scleral icterus.    Conjunctiva/sclera: Conjunctivae normal.  Neurological:     General: No focal deficit present.     Mental Status: She is alert and oriented to person, place, and time.     Cranial Nerves: No cranial nerve deficit.  Psychiatric:        Mood and Affect: Mood normal.        Behavior: Behavior normal.        Judgment: Judgment normal.     Female chaperone present for pelvic and breast  portions of the physical exam  Assessment: 28 y.o. 30 female here for  1. Anxiety      Plan: Problem List Items Addressed This Visit    None    Visit Diagnoses    Anxiety    -  Primary   Relevant Medications   citalopram (CELEXA) 10 MG tablet    Discussed treatment options. Discussed that she could undergo no treatment at this time and monitor symptoms. We discussed treatment using counseling therapy.  We further discussed medication therapy. We also discussed a  combination of counseling and medication therapy. She states she will try a combination of counseling and medication. Given odd symptoms in the past surrounding use of zoloft (though it is unclear whether  the medication is 100% related to her symptoms), will try a different SSRI. Some options limited due to breastfeeding status.  Will have her follow up in 2 weeks for 6 week postpartum appointment and reassess mood at that time.  15 minutes spent in face to face discussion with > 50% spent in counseling,management, and coordination of care of her anxiety.   Prentice Docker, MD 06/11/2019 4:50 PM

## 2019-06-11 NOTE — Patient Instructions (Signed)
Therapists/Counselors/Psychologists  Karen Canada, LPC, Michelle Van Horton, Cindy and Susan  [phone]  [address]  [place], [place] [address]   Gary Bailey, CSW [phone] [address] [place], [place] [address]  Julia Tabor, LPC  [phone]  [address]  [place], [place] [address]   Chevene Bryant, MS [phone] [address] [place], [place] [address]  Joanna Warren, LMFT  [phone]  [address]  [place], [place] [address]   Tina Thompson [phone] [address] [place], [place] [address]  Amanda Miller  [phone]  [address]  [place], [place] [address]   Bart McCormick [phone] [address] [place], [place] [address]  Cristin Saffo, PsyD  [phone]  [address]  [place], [place] [address]   Cheryl Lawson, LPC [phone] [address] [place], [place] [address]  Courtney Jones  [phone]  [address]  [place], [place] [address]   Laura Ellington [facility] [phone] [email or web address]  Sation Konchella  [phone]  [address]  [place],  [address]   Carmen Bork [facility] [phone] [email or web address]  

## 2019-06-17 DIAGNOSIS — F330 Major depressive disorder, recurrent, mild: Secondary | ICD-10-CM | POA: Diagnosis not present

## 2019-06-24 ENCOUNTER — Other Ambulatory Visit: Payer: Self-pay | Admitting: Obstetrics and Gynecology

## 2019-06-24 DIAGNOSIS — F419 Anxiety disorder, unspecified: Secondary | ICD-10-CM

## 2019-06-24 NOTE — Telephone Encounter (Signed)
Please advise, pt has 6 week pp visit on 12/30

## 2019-07-01 ENCOUNTER — Other Ambulatory Visit: Payer: Self-pay

## 2019-07-01 ENCOUNTER — Ambulatory Visit: Payer: Medicaid Other | Admitting: Obstetrics and Gynecology

## 2019-07-01 ENCOUNTER — Encounter: Payer: Self-pay | Admitting: Obstetrics and Gynecology

## 2019-07-01 ENCOUNTER — Ambulatory Visit (INDEPENDENT_AMBULATORY_CARE_PROVIDER_SITE_OTHER): Payer: Medicaid Other | Admitting: Obstetrics and Gynecology

## 2019-07-01 ENCOUNTER — Other Ambulatory Visit (HOSPITAL_COMMUNITY)
Admission: RE | Admit: 2019-07-01 | Discharge: 2019-07-01 | Disposition: A | Discharge status: Home / Self Care | Payer: Medicaid Other | Source: Ambulatory Visit | Attending: Obstetrics and Gynecology | Admitting: Obstetrics and Gynecology

## 2019-07-01 DIAGNOSIS — Z124 Encounter for screening for malignant neoplasm of cervix: Secondary | ICD-10-CM

## 2019-07-01 DIAGNOSIS — Z30430 Encounter for insertion of intrauterine contraceptive device: Secondary | ICD-10-CM

## 2019-07-01 MED ORDER — LEVONORGESTREL 20 MCG/24HR IU IUD: 1.0000 | INTRAUTERINE_SYSTEM | Freq: Once | INTRAUTERINE | 0 refills | Status: DC

## 2019-07-01 NOTE — Progress Notes (Signed)
Postpartum Visit   Chief Complaint  Patient presents with  . Postpartum Care    History of Present Illness: Patient is a 28 y.o. [id number] presents for postpartum visit.  Date of delivery: 05/17/2019 Type of delivery: Vacuum assisted vaginal delivery - Vacuum or forceps assisted  Yes (vacuum) Episiotomy No.  Laceration: 1st degree Pregnancy or labor problems:  no Any problems since the delivery:  A little anxiety, going to therapy  Newborn Details:  SINGLETON :  1. Baby's name: Elenore Rota. Birth weight: 7.9lb Maternal Details:  Breast Feeding:  yes Post partum depression/anxiety noted:  A little anxiety, taking therapy. Not taking Celexa at the time, wants to try therapy first.  Mallory Craig Post-Partum Depression Score:  9  Date of last PAP: 2016  normal   Past Medical History:  Diagnosis Date  . Allergy    Claritin  . Anxiety   . Asthma    Childhood; last Albuterol use age 38  . 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 ADENOIDECTOMY    . WISDOM TOOTH EXTRACTION      Prior to Admission medications   Medication Sig Start Date End Date Taking? Authorizing Provider  acetaminophen (TYLENOL) 500 MG tablet Take 500 mg by mouth every 6 (six) hours as needed.   Yes [provider]  Prenatal Vit-Fe Fumarate-FA (PRENATAL VITAMINS) 28-0.8 MG TABS Take 1 tablet by mouth daily. [employer] brand   Yes [provider]  citalopram (CELEXA) 10 MG tablet TAKE 1 TABLET BY MOUTH EVERY DAY 06/24/19   Will Bonnet, MD    Allergies  Allergen Reactions  . Tape Hives    Had an epidural and had a piece of tape used and caused hives.  . Bee Venom Swelling     Social History   Socioeconomic History  . Marital status: Single    Spouse name: Not on file  . Number of children: Not on file  . Years of education: Not on file  . Highest education level: Not on file  Occupational History  . Not on file   Tobacco Use  . Smoking status: Never Smoker  . Smokeless tobacco: Never Used  Substance and Sexual Activity  . Alcohol use: No    Alcohol/week: 0.0 standard drinks  . Drug use: No  . Sexual activity: Not Currently    Partners: Male    Birth control/protection: I.U.D.    Comment: still deciding  Other Topics Concern  . Not on file  Social History Narrative   Marital status: married x 4 years.  Happily married; no abuse      Children:  2 children (2, 1)      Lives: with husband, 2 children.      Employment:  Homemaker      Tobacco: none      Alcohol: 2 drinks per week      Drugs:  None      Exercise:  No formal exercise.     Social Determinants of Health   Financial Resource Strain:   . Difficulty of Paying Living Expenses: Not on file  Food Insecurity:   . Worried About Charity fundraiser in the Last Year: Not on file  . Ran Out of Food in the Last Year: Not on file  Transportation Needs:   . Lack of Transportation (Medical): Not on file  . Lack of Transportation (Non-Medical): Not on file  Physical Activity:   .  Days of Exercise per Week: Not on file  . Minutes of Exercise per Session: Not on file  Stress:   . Feeling of Stress : Not on file  Social Connections:   . Frequency of Communication with Friends and Family: Not on file  . Frequency of Social Gatherings with Friends and Family: Not on file  . Attends Religious Services: Not on file  . Active Member of Clubs or Organizations: Not on file  . Attends BankerClub or Organization Meetings: Not on file  . Marital Status: Not on file  Intimate Partner Violence:   . Fear of Current or Ex-Partner: Not on file  . Emotionally Abused: Not on file  . Physically Abused: Not on file  . Sexually Abused: Not on file    Family History  Problem Relation Age of Onset  . Cancer Mother        skin cancer  . Arthritis Mother   . Hyperthyroidism Mother   . Cancer Maternal Grandmother        Lung  . Cancer Maternal Grandfather    . Asthma Brother     Review of Systems  Constitutional: Negative.   HENT: Negative.   Eyes: Negative.   Respiratory: Negative.   Cardiovascular: Negative.   Gastrointestinal: Negative.   Genitourinary: Negative.   Musculoskeletal: Negative.   Skin: Negative.   Neurological: Negative.   Psychiatric/Behavioral: Negative.      Physical Exam BP 122/74   Ht 5\' 7"  (1.702 m)   Wt 167 lb (75.8 kg)   BMI 26.16 kg/m   Physical Exam Constitutional:      General: She is not in acute distress.    Appearance: Normal appearance. She is well-developed.  Genitourinary:     Pelvic exam was performed with patient in the lithotomy position.     Vulva, inguinal canal, urethra, bladder, vagina, uterus, right adnexa and left adnexa normal.     No posterior fourchette tenderness, injury or lesion present.     No cervical friability, lesion, bleeding or polyp.  HENT:     Head: Normocephalic and atraumatic.  Eyes:     General: No scleral icterus.    Conjunctiva/sclera: Conjunctivae normal.  Cardiovascular:     Rate and Rhythm: Normal rate and regular rhythm.     Heart sounds: No murmur. No friction rub. No gallop.   Pulmonary:     Effort: Pulmonary effort is normal. No respiratory distress.     Breath sounds: Normal breath sounds. No wheezing or rales.  Abdominal:     General: Bowel sounds are normal. There is no distension.     Palpations: Abdomen is soft. There is no mass.     Tenderness: There is no abdominal tenderness. There is no guarding or rebound.  Musculoskeletal:        General: Normal range of motion.     Cervical back: Normal range of motion and neck supple.  Neurological:     General: No focal deficit present.     Mental Status: She is alert and oriented to person, place, and time.     Cranial Nerves: No cranial nerve deficit.  Skin:    General: Skin is warm and dry.     Findings: No erythema.  Psychiatric:        Mood and Affect: Mood normal.        Behavior:  Behavior normal.        Judgment: Judgment normal.    IUD Insertion Procedure Note Patient identified, informed  consent performed, consent signed.   Discussed risks of irregular bleeding, cramping, infection, malpositioning, expulsion or uterine perforation of the IUD (1:1000 placements)  which may require further procedure such as laparoscopy.  IUD while effective at preventing pregnancy do not prevent transmission of sexually transmitted diseases and use of barrier methods for this purpose was discussed. Time out was performed.  Urine pregnancy test negative.  Speculum placed in the vagina.  Cervix visualized.  Cleaned with Betadine x 2.  Grasped anteriorly with a single tooth tenaculum.  Uterus sounded to 8 cm. IUD placed per manufacturer's recommendations.  Strings trimmed to 3 cm. Tenaculum was removed, good hemostasis noted.  Patient tolerated procedure well.   Patient was given post-procedure instructions.  She was advised to have backup contraception for one week.  Patient was also asked to check IUD strings periodically and follow up in 4 weeks for IUD check.    Female Chaperone present during breast and/or pelvic exam.  Assessment: 28 y.o. [id number] presenting for 6 week postpartum visit  Plan: Problem List Items Addressed This Visit      Other   Postpartum care following vaginal delivery - Primary   Relevant Medications   levonorgestrel (MIRENA) 20 MCG/24HR IUD   Other Relevant Orders   Cytology - PAP    Other Visit Diagnoses    Pap smear for cervical cancer screening       Relevant Orders   Cytology - PAP   Encounter for IUD insertion       Relevant Medications   levonorgestrel (MIRENA) 20 MCG/24HR IUD       1) Contraception Education given regarding options for contraception, including IUD placement.  2)  Pap - ASCCP guidelines and rational discussed.  Patient opts for routine screening interval. Obtained today.  3) Patient underwent screening for postpartum  depression with some concerns noted. She is managing well with therapy and this time and will continue to monitor.  4) Follow up 4 weeks for IUD string check  Thomasene Mohair, MD 07/01/2019 4:38 PM

## 2019-07-07 ENCOUNTER — Telehealth: Payer: Self-pay

## 2019-07-07 DIAGNOSIS — F330 Major depressive disorder, recurrent, mild: Secondary | ICD-10-CM | POA: Diagnosis not present

## 2019-07-07 NOTE — Telephone Encounter (Signed)
[facility] is faxing over a form for me to fill out and add HPV. She said it was not ordered with it, so I will add it once she faxes form to me in just a bit

## 2019-07-07 NOTE — Progress Notes (Signed)
Can you call the lab and ask them to make sure they run the test for HPV?  The test was ordered such that HPV would be run if the result was ASC-US.  I verified that the order was placed correctly. So, they should have run it.  Thanks

## 2019-07-07 NOTE — Telephone Encounter (Signed)
-----   Message from Conard Novak, MD sent at 07/07/2019  8:45 AM EST ----- Can you call the lab and ask them to make sure they run the test for HPV?  The test was ordered such that HPV would be run if the result was ASC-US.  I verified that the order was placed correctly. So, they should have run it.  Thanks

## 2019-07-07 NOTE — Telephone Encounter (Signed)
-----   Message from Stephen D Jackson, MD sent at 07/07/2019  8:45 AM EST ----- Can you call the lab and ask them to make sure they run the test for HPV?  The test was ordered such that HPV would be run if the result was ASC-US.  I verified that the order was placed correctly. So, they should have run it.  Thanks 

## 2019-07-07 NOTE — Telephone Encounter (Signed)
The order was definitely placed as a "reflex" order. This means that no additional order should be needed. I think it's fine to go ahead and add it to the form that they send over. It just needs to be high risk HPV (no need for HPV 16 or HPV 18/45).   If this continues to happen, we'll have to report this as a quality issue to the pathology department.

## 2019-07-09 LAB — CYTOLOGY - PAP
Comment: NEGATIVE
Diagnosis: UNDETERMINED — AB
High risk HPV: NEGATIVE

## 2019-07-31 ENCOUNTER — Ambulatory Visit: Payer: Medicaid Other | Admitting: Obstetrics and Gynecology

## 2019-07-31 ENCOUNTER — Other Ambulatory Visit: Payer: Self-pay

## 2019-07-31 ENCOUNTER — Encounter: Payer: Self-pay | Admitting: Obstetrics and Gynecology

## 2019-08-11 ENCOUNTER — Ambulatory Visit (INDEPENDENT_AMBULATORY_CARE_PROVIDER_SITE_OTHER): Payer: Medicaid Other | Admitting: Obstetrics and Gynecology

## 2019-08-11 ENCOUNTER — Other Ambulatory Visit: Payer: Self-pay

## 2019-08-11 ENCOUNTER — Encounter: Payer: Self-pay | Admitting: Obstetrics and Gynecology

## 2019-08-11 VITALS — BP 120/80 | Ht 67.0 in | Wt 163.0 lb

## 2019-08-11 DIAGNOSIS — N938 Other specified abnormal uterine and vaginal bleeding: Secondary | ICD-10-CM | POA: Diagnosis not present

## 2019-08-11 DIAGNOSIS — N941 Unspecified dyspareunia: Secondary | ICD-10-CM

## 2019-08-11 DIAGNOSIS — R519 Headache, unspecified: Secondary | ICD-10-CM | POA: Diagnosis not present

## 2019-08-11 DIAGNOSIS — Z30011 Encounter for initial prescription of contraceptive pills: Secondary | ICD-10-CM

## 2019-08-11 DIAGNOSIS — Z30432 Encounter for removal of intrauterine contraceptive device: Secondary | ICD-10-CM | POA: Diagnosis not present

## 2019-08-11 DIAGNOSIS — Z3009 Encounter for other general counseling and advice on contraception: Secondary | ICD-10-CM | POA: Diagnosis not present

## 2019-08-11 MED ORDER — NORETHINDRONE 0.35 MG PO TABS: 1.0000 | ORAL_TABLET | Freq: Every day | ORAL | 3 refills | Status: DC

## 2019-08-11 NOTE — Patient Instructions (Signed)
I value your feedback and entrusting us with your care. If you get a [facility] patient survey, I would appreciate you taking the time to let us know about your experience today. Thank you!  As of June 11, 2019, your lab results will be released to your MyChart immediately, before I even have a chance to see them. Please give me time to review them and contact you if there are any abnormalities. Thank you for your patience.  

## 2019-08-11 NOTE — Progress Notes (Signed)
   Chief Complaint  Patient presents with  . IUD removal    due to heavy bleeding, pain during intercourse, mood swings     History of Present Illness:  Mallory Craig is a 29 y.o. that had a Mirena IUD placed approximately 5 wks ago at [facility] visit. Since that time, she has had headaches, daily bleeding, dyspareunia, mood swings. Would like to change to pills. Did OCPs in past without side effects. Currently breastfeeding.   BP 120/80   Ht 5\' 7"  (1.702 m)   Wt 163 lb (73.9 kg)   Breastfeeding Yes   BMI 25.53 kg/m   Pelvic exam:  Two IUD strings present seen coming from the cervical os. EGBUS, vaginal vault and cervix: within normal limits  IUD Removal Strings of IUD identified and grasped.  IUD removed without problem with ring forceps.  Pt tolerated this well.  IUD noted to be intact.  Assessment:  Encounter for IUD removal  Encounter for initial prescription of contraceptive pills - Plan: norethindrone (MICRONOR) 0.35 MG tablet; POP start today, condoms. F/u prn.   Meds ordered this encounter  Medications  . norethindrone (MICRONOR) 0.35 MG tablet    Sig: Take 1 tablet (0.35 mg total) by mouth daily.    Dispense:  84 tablet    Refill:  3    Order Specific Question:   Supervising Provider    Answer:   Nadara Mustard   Pt will be self-pay soon, suggested [email or web address] coupon.  Plan: IUD removed and plan for contraception is oral progesterone-only contraceptive. She was amenable to this plan.  Esmay Amspacher B. Brandyn Lowrey, PA-C 08/11/2019 3:18 PM

## 2019-08-12 ENCOUNTER — Ambulatory Visit: Payer: Medicaid Other | Admitting: Obstetrics and Gynecology

## 2020-02-26 ENCOUNTER — Ambulatory Visit: Payer: Self-pay

## 2020-03-05 IMAGING — CT CT ABD-PELV W/ CM
2 of 4 series · 16 of 46 positions shown, 18 images · IV contrast (iopamidol)
Comparison: None

CLINICAL DATA: Nausea, vomiting, RIGHT abdominal pain, suspected
appendicitis

EXAM:
CT ABDOMEN AND PELVIS WITH CONTRAST
TECHNIQUE: Multidetector CT imaging of the abdomen and pelvis was performed
using the standard protocol following bolus administration of
intravenous contrast. Sagittal and coronal MPR images reconstructed
from axial data set.
CONTRAST:  75mL 1BMZHD-W44 IOPAMIDOL (1BMZHD-W44) INJECTION 61% IV.
No oral contrast administered.

[Series 3: a/p w/ 5mm · axial · 0.62mm/px · z∈[+496,+902]mm · 13 of 89 slices shown, 15 images]
[im 4/89  soft-tissue]
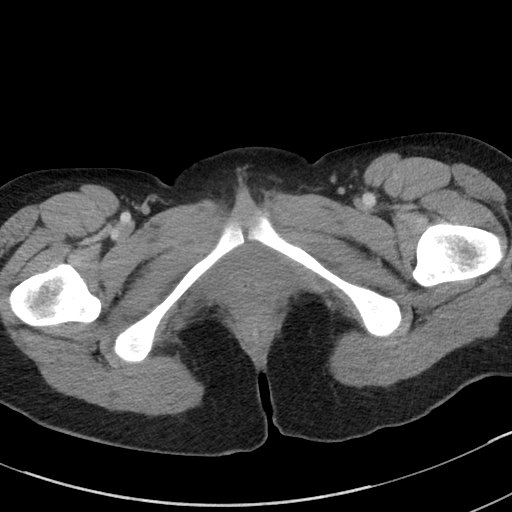
[im 4/89  bone]
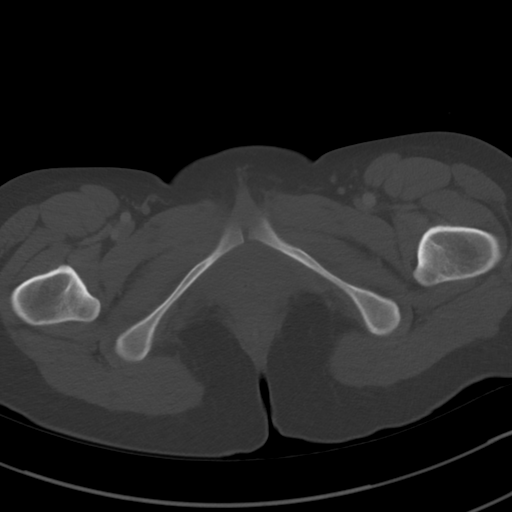
[im 11/89  soft-tissue]
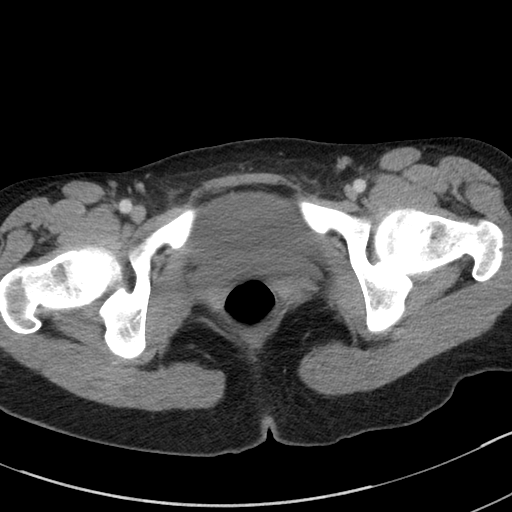
[im 17/89  soft-tissue]
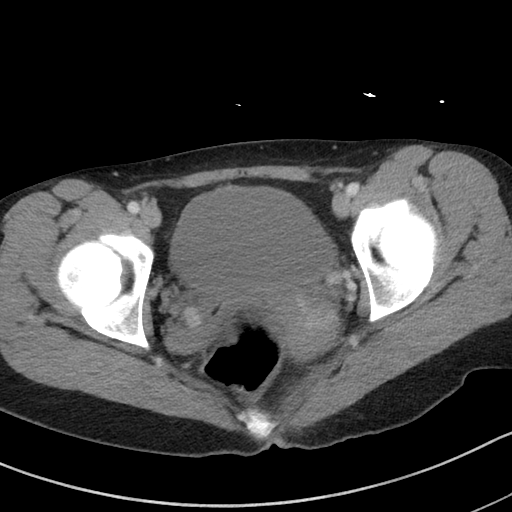
[im 24/89  soft-tissue]
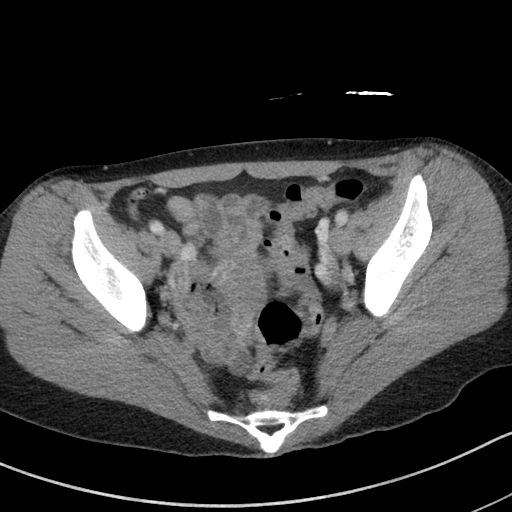
[im 31/89  soft-tissue]
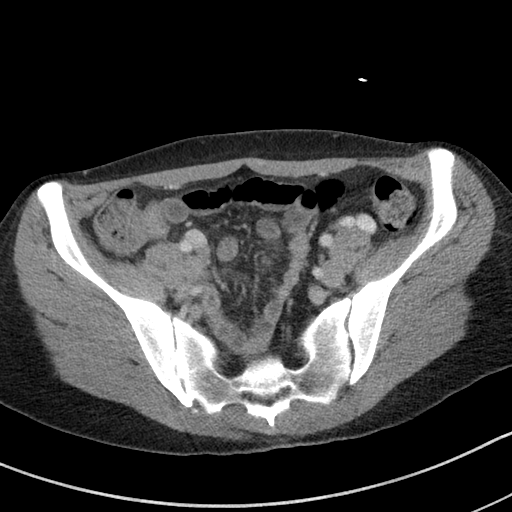
[im 38/89  soft-tissue]
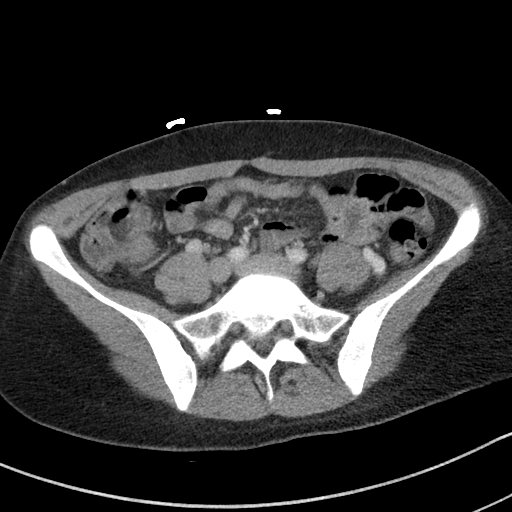
[im 45/89  soft-tissue]
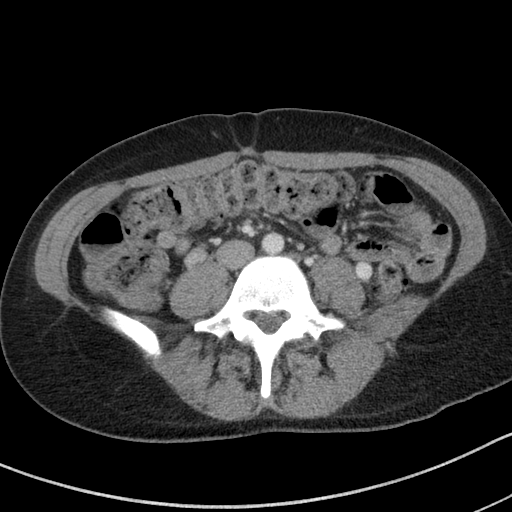
[im 51/89  soft-tissue]
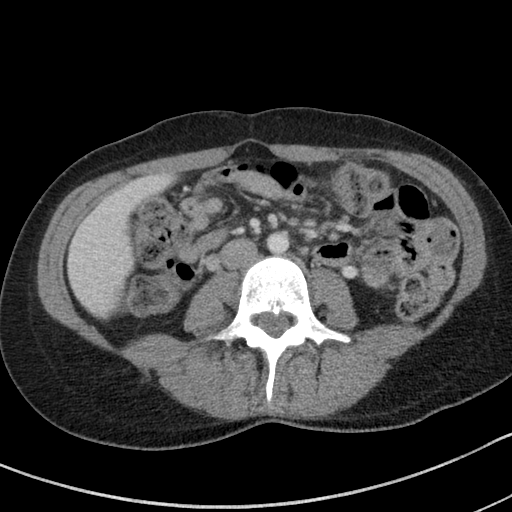
[im 58/89  soft-tissue]
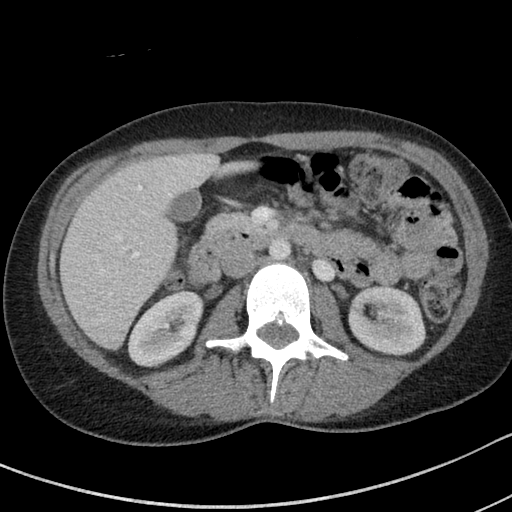
[im 58/89  bone]
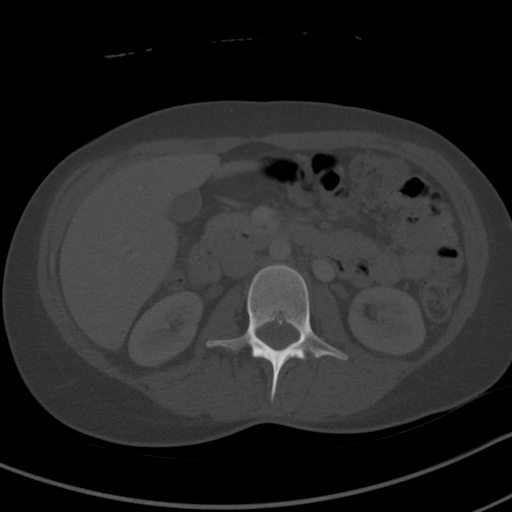
[im 65/89  soft-tissue]
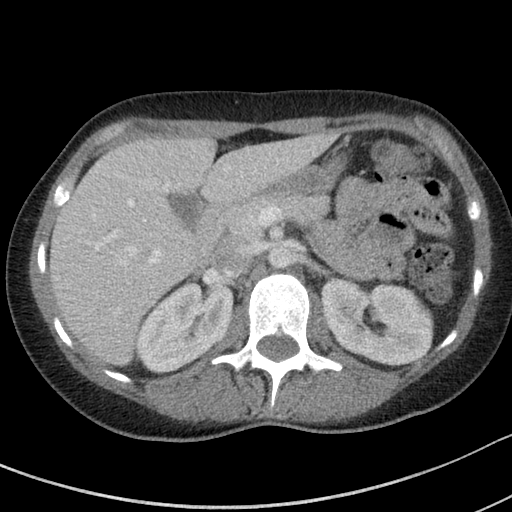
[im 72/89  soft-tissue]
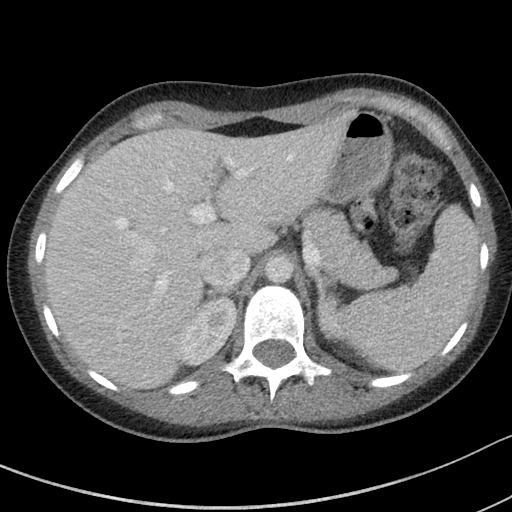
[im 78/89  soft-tissue]
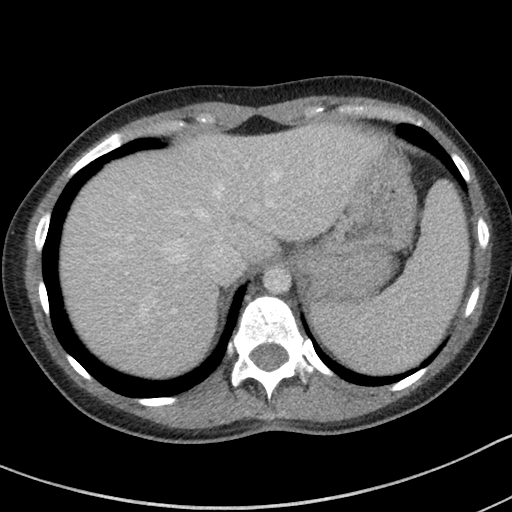
[im 85/89  soft-tissue]
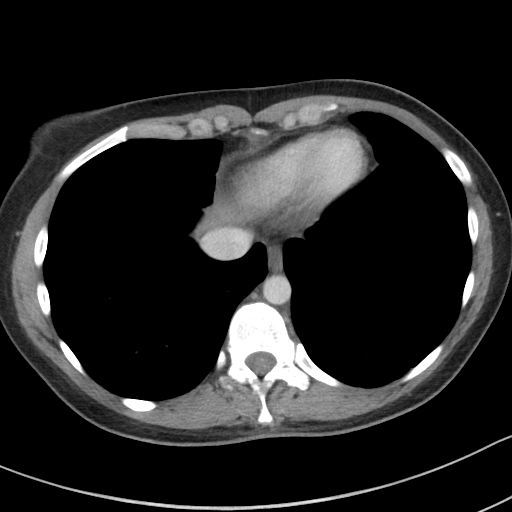

[Series 6: a/p w/ cor · coronal · 0.78mm/px · 3 of 142 slices shown]
[im 48/142  soft-tissue]
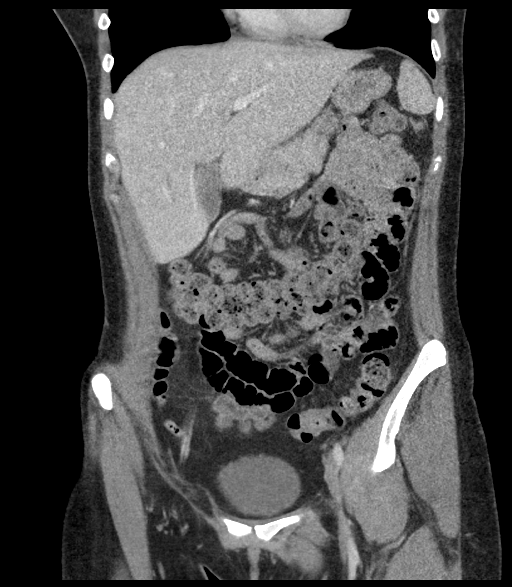
[im 63/142  soft-tissue]
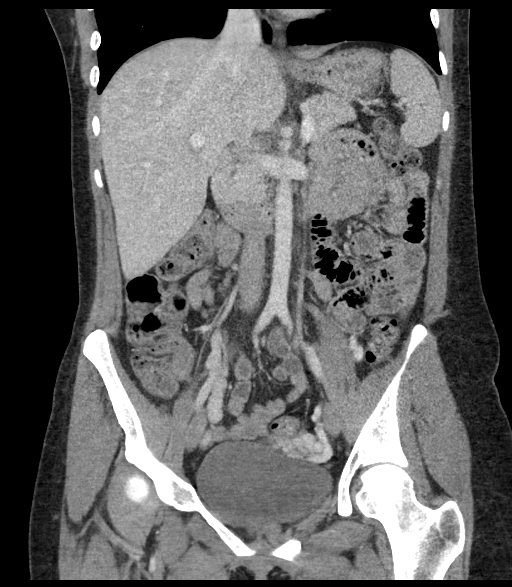
[im 79/142  soft-tissue]
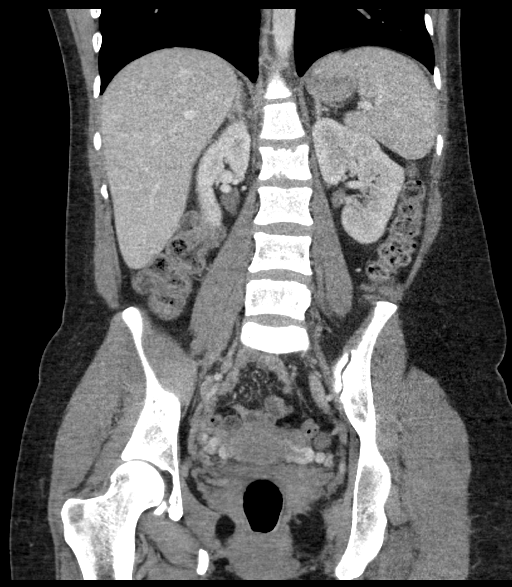

[16 of 46 positions shown; findings below may reference images not displayed]

FINDINGS: Lower chest: Lung bases clear

Hepatobiliary: Gallbladder and liver normal appearance

Pancreas: Normal appearance

Spleen: Normal appearance

Adrenals/Urinary Tract: Adrenal glands, kidneys, ureters, and
bladder normal appearance

Stomach/Bowel: Normal appendix. Stool throughout colon. Stomach and
bowel loops otherwise normal appearance for exam lacking GI
contrast.

Vascular/Lymphatic: Aorta normal caliber. Vascular structures
patent. Numerous prominent vessels in the parametrial regions/adnexa
bilaterally, nonspecific but can be seen with pelvic congestion
syndrome. Few scattered normal sized lymph nodes without abdominal
or pelvic adenopathy.

Reproductive: Uterus and ovaries normal appearance

Other: Umbilical hernia containing fat. No free air or free fluid.
No definite inflammatory process.

Musculoskeletal: Few scattered Schmorl's nodes in lumbar spine. No
acute osseous findings.
IMPRESSION: Normal appendix.

Small umbilical hernia containing fat.

Prominent vessels in the adnexa bilaterally, nonspecific but can be
seen with pelvic congestion syndrome.

## 2020-10-04 ENCOUNTER — Other Ambulatory Visit: Payer: Self-pay

## 2020-10-04 ENCOUNTER — Ambulatory Visit: Payer: Medicaid Other | Attending: Internal Medicine | Admitting: Internal Medicine

## 2020-10-04 ENCOUNTER — Telehealth: Payer: Self-pay

## 2020-10-04 ENCOUNTER — Encounter: Payer: Self-pay | Admitting: Internal Medicine

## 2020-10-04 VITALS — BP 110/76 | HR 79 | Resp 16 | Ht 67.0 in | Wt 154.6 lb

## 2020-10-04 DIAGNOSIS — Z3009 Encounter for other general counseling and advice on contraception: Secondary | ICD-10-CM | POA: Diagnosis not present

## 2020-10-04 DIAGNOSIS — F411 Generalized anxiety disorder: Secondary | ICD-10-CM

## 2020-10-04 DIAGNOSIS — F32A Depression, unspecified: Secondary | ICD-10-CM | POA: Insufficient documentation

## 2020-10-04 DIAGNOSIS — F320 Major depressive disorder, single episode, mild: Secondary | ICD-10-CM | POA: Diagnosis not present

## 2020-10-04 DIAGNOSIS — Z2821 Immunization not carried out because of patient refusal: Secondary | ICD-10-CM | POA: Diagnosis not present

## 2020-10-04 MED ORDER — BUSPIRONE HCL 5 MG PO TABS
5.0000 mg | ORAL_TABLET | Freq: Two times a day (BID) | ORAL | 1 refills | Status: DC
  Filled 2020-10-04: qty 60, 30d supply, fill #0

## 2020-10-04 NOTE — Telephone Encounter (Signed)
Reached out the patient by phone to discuss scheduling with a mental health provider at [facility]. Patient discussed they are open to a virtual appointment with the provider and is able to have a in person visit as well.   Reached out to the [facility] to schedule an appointment for the patient. Outpatient appointments are booked until mid June 2022. Patients are encouraged to visit [facility] as a walk in for outpatient therapy on Monday- Thursday from 8 am to 11am.

## 2020-10-04 NOTE — Patient Instructions (Signed)
Buspirone tablets What is this medicine? BUSPIRONE (byoo SPYE rone) is used to treat anxiety disorders. This medicine may be used for other purposes; ask your health care provider or pharmacist if you have questions. COMMON BRAND NAME(S): BuSpar What should I tell my health care provider before I take this medicine? They need to know if you have any of these conditions: kidney or liver disease an unusual or allergic reaction to buspirone, other medicines, foods, dyes, or preservatives pregnant or trying to get pregnant breast-feeding How should I use this medicine? Take this medicine by mouth with a glass of water. Follow the directions on the prescription label. You may take this medicine with or without food. To ensure that this medicine always works the same way for you, you should take it either always with or always without food. Take your doses at regular intervals. Do not take your medicine more often than directed. Do not stop taking except on the advice of your doctor or health care professional. Talk to your pediatrician regarding the use of this medicine in children. Special care may be needed. Overdosage: If you think you have taken too much of this medicine contact a poison control center or emergency room at once. NOTE: This medicine is only for you. Do not share this medicine with others. What if I miss a dose? If you miss a dose, take it as soon as you can. If it is almost time for your next dose, take only that dose. Do not take double or extra doses. What may interact with this medicine? Do not take this medicine with any of the following medications: linezolid MAOIs like Carbex, Eldepryl, Marplan, Nardil, and Parnate methylene blue procarbazine This medicine may also interact with the following medications: diazepam digoxin diltiazem erythromycin grapefruit juice haloperidol medicines for mental depression or mood problems medicines for seizures like carbamazepine,  phenobarbital and phenytoin nefazodone other medications for anxiety rifampin ritonavir some antifungal medicines like itraconazole, ketoconazole, and voriconazole verapamil warfarin This list may not describe all possible interactions. Give your health care provider a list of all the medicines, herbs, non-prescription drugs, or dietary supplements you use. Also tell them if you smoke, drink alcohol, or use illegal drugs. Some items may interact with your medicine. What should I watch for while using this medicine? Visit your doctor or health care professional for regular checks on your progress. It may take 1 to 2 weeks before your anxiety gets better. You may get drowsy or dizzy. Do not drive, use machinery, or do anything that needs mental alertness until you know how this drug affects you. Do not stand or sit up quickly, especially if you are an older patient. This reduces the risk of dizzy or fainting spells. Alcohol can make you more drowsy and dizzy. Avoid alcoholic drinks. What side effects may I notice from receiving this medicine? Side effects that you should report to your doctor or health care professional as soon as possible: blurred vision or other vision changes chest pain confusion difficulty breathing feelings of hostility or anger muscle aches and pains numbness or tingling in hands or feet ringing in the ears skin rash and itching vomiting weakness Side effects that usually do not require medical attention (report to your doctor or health care professional if they continue or are bothersome): disturbed dreams, nightmares headache nausea restlessness or nervousness sore throat and nasal congestion stomach upset This list may not describe all possible side effects. Call your doctor for medical advice about side   effects. You may report side effects to FDA at 1-800-FDA-1088. Where should I keep my medicine? Keep out of the reach of children. Store at room temperature  below 30 degrees C (86 degrees F). Protect from light. Keep container tightly closed. Throw away any unused medicine after the expiration date. NOTE: This sheet is a summary. It may not cover all possible information. If you have questions about this medicine, talk to your doctor, pharmacist, or health care provider.  2021 Elsevier/Gold Standard (2010-01-26 18:06:11)  

## 2020-10-04 NOTE — Progress Notes (Signed)
Patient ID: Mallory Craig, female    DOB: 11/15/1990  MRN: [id number]  CC: New Patient (Initial Visit)   Subjective: Mallory Craig is a 30 y.o. female who presents for new pt visit Her concerns today include:   No previous PCP.  Only had GYN  Main concern today is that she wants to be evaluated and treated for anxiety. -History of postpartum depression and anxiety. Depression is mild and not as bothersome at this time as the anxiety. -Anxiety worse after giving birth to a stillborn child 07/2015. -Anxiety attacks at least twice a week.  "I can go from being fine to the sky is falling."  She withdraws and gets irritable and moody when she has panic attacks. -Constantly worries about things.  She has fear of not being financially stable especially after her husband had a health scare last year where he was hospitalized for 3 days.  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 Tried Zolft and Lexapro in the past.  Zoloft gave "brain zaps" which she states is a feeling of being electrocuted. Lexapro made depression worse. Saw a couple therapists when she was pregnant with 2nd daughter. Talking about past events made vomiting associated with pregnancy worse.  2nd therapist was unreliable and not responsive to her needs.    Also wants to consider being placed on birth control now that she is no longer breast-feeding Tried on the minipill in the past but felt that this made depression worse and had hard time remembering to take it.  Tried IUD Mirena - had a lot of DUB with Mirena.  Past medical, social, family history and surgical history reviewed and updated.  HM:  Decline COVID-19 vac per my CMA Patient Active Problem List   Diagnosis Date Noted  . COVID-19 vaccine dose declined 10/04/2020  . Postpartum care following vaginal delivery 05/18/2019  . Normal labor 05/17/2019  . History of IUFD 05/15/2019  . Family history of genetic disorder 06/09/2015  . Supervision  of other normal pregnancy, antepartum 03/23/2015  . Anxiety and depression 02/14/2015     Current Outpatient Medications on File Prior to Visit  Medication Sig Dispense Refill  . acetaminophen (TYLENOL) 500 MG tablet Take 500 mg by mouth every 6 (six) hours as needed. (Patient not taking: Reported on 10/04/2020)    . levonorgestrel (MIRENA) 20 MCG/24HR IUD 1 Intra Uterine Device (1 each total) by Intrauterine route once for 1 dose. 1 each 0  . norethindrone (MICRONOR) 0.35 MG tablet Take 1 tablet (0.35 mg total) by mouth daily. 84 tablet 3  . Prenatal Vit-Fe Fumarate-FA (PRENATAL VITAMINS) 28-0.8 MG TABS Take 1 tablet by mouth daily. [employer] brand     No current facility-administered medications on file prior to visit.    Allergies  Allergen Reactions  . Tape Hives    Had an epidural and had a piece of tape used and caused hives.  . Bee Venom Swelling    Social History   Socioeconomic History  . Marital status: Married    Spouse name: Not on file  . Number of children: 4  . Years of education: Not on file  . Highest education level: 12th grade  Occupational History  . Occupation: stay at home  Tobacco Use  . Smoking status: Never Smoker  . Smokeless tobacco: Never Used  Vaping Use  . Vaping Use: Never used  Substance and Sexual Activity  . Alcohol use: Yes    Alcohol/week: 0.0  standard drinks    Comment: occasionally  . Drug use: No  . Sexual activity: Yes    Partners: Male    Birth control/protection: I.U.D.    Comment: mirena  Other Topics Concern  . Not on file  Social History Narrative  . Not on file   Social Determinants of Health   Financial Resource Strain: Not on file  Food Insecurity: Not on file  Transportation Needs: Not on file  Physical Activity: Not on file  Stress: Not on file  Social Connections: Not on file  Intimate Partner Violence: Not on file    Family History  Problem Relation Age of Onset  . Cancer Mother        skin cancer  .  Arthritis Mother   . Hyperthyroidism Mother   . Cancer Maternal Grandmother        Lung  . Cancer Maternal Grandfather   . Asthma Brother     Past Surgical History:  Procedure Laterality Date  . ADENOIDECTOMY    . NO PAST SURGERIES    . WISDOM TOOTH EXTRACTION      ROS: Review of Systems Negative except as stated above  PHYSICAL EXAM: BP 110/76   Pulse 79   Resp 16   Ht 5\' 7"  (1.702 m)   Wt 154 lb 9.6 oz (70.1 kg)   SpO2 100%   BMI 24.21 kg/m   Physical Exam  General appearance - alert, well appearing, young Caucasian female and in no distress Mental status - normal mood, behavior, speech, dress, motor activity, and thought processes Neck - supple, no significant adenopathy Chest - clear to auscultation, no wheezes, rales or rhonchi, symmetric air entry Heart - normal rate, regular rhythm, normal S1, S2, no murmurs, rubs, clicks or gallops  Depression screen [facility] 2/9 10/04/2020 10/16/2016 08/29/2015  Decreased Interest 0 0 3  Down, Depressed, Hopeless 1 0 3  PHQ - 2 Score 1 0 6  Altered sleeping - - 0  Tired, decreased energy - - 2  Change in appetite - - 0  Feeling bad or failure about yourself  - - 3  Trouble concentrating - - 0  Moving slowly or fidgety/restless - - 0  Suicidal thoughts - - 0  PHQ-9 Score - - 11  Difficult doing work/chores - - Very difficult   GAD 7 : Generalized Anxiety Score 10/04/2020  Nervous, Anxious, on Edge 3  Control/stop worrying 3  Worry too much - different things 2  Trouble relaxing 1  Restless 1  Easily annoyed or irritable 3  Afraid - awful might happen 0  Total GAD 7 Score 13      CMP Latest Ref Rng & Units 11/07/2017 10/16/2016 11/30/2014  Glucose 65 - 99 mg/dL 99 88 97  BUN 6 - 20 mg/dL 11 20 16   Creatinine 0.44 - 1.00 mg/dL 12/02/2014 9.56  Sodium 135 - 145 mmol/L 139 143 139  Potassium 3.5 - 5.1 mmol/L 3.6 3.9 4.8  Chloride 101 - 111 mmol/L 105 103 107  CO2 22 - 32 mmol/L 24 18 23   Calcium 8.9 - 10.3 mg/dL 9.2 9.5 9.0   Total Protein 6.5 - 8.1 g/dL 7.2 7.2 6.4(L)  Total Bilirubin 0.3 - 1.2 mg/dL 2.13) 0.8 0.7  Alkaline Phos 38 - 126 U/L 83 82 70  AST 15 - 41 U/L 16 16 28   ALT 14 - 54 U/L 13(L) 13 11(L)   Lipid Panel  No results found for: CHOL, TRIG, HDL, CHOLHDL, VLDL,  LDLCALC, LDLDIRECT  CBC    Component Value Date/Time   WBC 13.4 (H) 05/18/2019 0558   RBC 3.90 05/18/2019 0558   HGB 11.9 (L) 05/18/2019 0558   HGB 12.6 03/04/2019 1023   HCT 34.2 (L) 05/18/2019 0558   HCT 37.5 03/04/2019 1023   PLT 167 05/18/2019 0558   PLT 154 03/04/2019 1023   MCV 87.7 05/18/2019 0558   MCV 92 03/04/2019 1023   MCV 90 09/02/2013 1030   MCH 30.5 05/18/2019 0558   MCHC 34.8 05/18/2019 0558   RDW 13.4 05/18/2019 0558   RDW 12.9 03/04/2019 1023   RDW 13.2 09/02/2013 1030   LYMPHSABS 0.9 03/04/2019 1023   LYMPHSABS 1.5 09/02/2013 1030   MONOABS 0.5 03/23/2015 1447   MONOABS 1.2 (H) 09/02/2013 1030   EOSABS 0.1 03/04/2019 1023   EOSABS 0.1 09/02/2013 1030   BASOSABS 0.0 03/04/2019 1023   BASOSABS 0.0 09/02/2013 1030    ASSESSMENT AND PLAN: 1. GAD (generalized anxiety disorder) Discussed management of anxiety to include CBT plus or minus medication.  Patient feels that her symptoms are bothersome enough that she would like to be treated and is open to being referred to behavioral health. -She did not tolerate SSRI or SNRI in the past.  Agreeable to trying BuSpar until we can get her in with a psychiatrist for medication management.  Printed information given on medication. Referral submitted to behavioral health. Follow-up with me in 6 weeks if she has not gotten into see the psychiatrist by that time - busPIRone (BUSPAR) 5 MG tablet; Take 1 tablet (5 mg total) by mouth 2 (two) times daily.  Dispense: 60 tablet; Refill: 1  2. Mild depressive disorder (HCC) Not a major issue for her at this time.  However this can be addressed when she sees the counselor  3. COVID-19 vaccine dose declined   4.  General counselling and advice on contraception Discussed contraceptive methods available including benefits and possible side effects/drawbacks.  Methods discussed include birth control pills, NuvaRing, Depo-Provera shot, Nexplanon.  Patient does not feel she would do very well in remembering to take birth control pills.  She is concerned about possible side effects of breakthrough bleeding and weight gain with the Depo-Provera.  She is leaning more towards the NuvaRing.  However she prefers to start the antianxiety medicine first and see how she tolerates it before adding another medication.  In the meantime she has been using spermicides.   Patient was given the opportunity to ask questions.  Patient verbalized understanding of the plan and was able to repeat key elements of the plan.   No orders of the defined types were placed in this encounter.    Requested Prescriptions   Signed Prescriptions Disp Refills  . busPIRone (BUSPAR) 5 MG tablet 60 tablet 1    Sig: Take 1 tablet (5 mg total) by mouth 2 (two) times daily.    Return in about 6 weeks (around 11/15/2020), or Telehealth visit.  Jonah Blue, MD, FACP

## 2020-10-05 ENCOUNTER — Telehealth: Payer: Self-pay | Admitting: Internal Medicine

## 2020-10-05 ENCOUNTER — Encounter: Payer: Self-pay | Admitting: Internal Medicine

## 2020-10-05 NOTE — Telephone Encounter (Signed)
-----   Message from [facility] sent at 10/04/2020  3:20 PM EDT ----- Contacted patient to schedule her for an initial consult with a mental health provider at [facility]. Patient mentioned she prefers virtual appts Monday-Friday anytime except 2pm to 3pm.   Connected with the [facility] to schedule an appointment for the patient. Outpatient appointments are booked until mid June 2022. Patients are encouraged to visit [facility] as a walk in for outpatient therapy on Monday- Thursday from 8 am to 11am.  Also Crisis Unit # [phone]. Once our new social worker starts, Mallory Craig may like to connect with her.  ----- Message ----- From: Marcine Matar, MD Sent: 10/04/2020   1:15 PM EDT To: Kathie Dike  Can you assist with getting this patient in with a counselor and psychiatrist at [facility] behavioral health.  She does have Medicaid.

## 2020-11-17 ENCOUNTER — Telehealth: Payer: Medicaid Other | Admitting: Internal Medicine

## 2020-12-15 ENCOUNTER — Ambulatory Visit: Payer: Medicaid Other | Admitting: Internal Medicine

## 2021-01-16 ENCOUNTER — Encounter: Payer: Self-pay | Admitting: Nurse Practitioner

## 2021-01-16 ENCOUNTER — Telehealth: Payer: Self-pay | Admitting: *Deleted

## 2021-01-16 ENCOUNTER — Other Ambulatory Visit: Payer: Self-pay

## 2021-01-16 ENCOUNTER — Ambulatory Visit: Payer: Medicaid Other | Attending: Nurse Practitioner | Admitting: Nurse Practitioner

## 2021-01-16 NOTE — Progress Notes (Signed)
Mailbox full. Will need to reschedule

## 2021-01-16 NOTE — Progress Notes (Signed)
Mailbox full

## 2021-01-16 NOTE — Telephone Encounter (Signed)
Entered in error

## 2021-01-17 ENCOUNTER — Encounter: Payer: Self-pay | Admitting: Nurse Practitioner

## 2021-01-17 ENCOUNTER — Other Ambulatory Visit: Payer: Self-pay | Admitting: Nurse Practitioner

## 2021-01-17 ENCOUNTER — Telehealth: Payer: Self-pay | Admitting: Internal Medicine

## 2021-01-17 ENCOUNTER — Other Ambulatory Visit: Payer: Self-pay

## 2021-01-17 MED ORDER — DULOXETINE HCL 30 MG PO CPEP
30.0000 mg | ORAL_CAPSULE | Freq: Every day | ORAL | 3 refills | Status: DC
  Filled 2021-01-17: qty 30, 30d supply, fill #0

## 2021-01-17 MED ORDER — DULOXETINE HCL 30 MG PO CPEP: 30.0000 mg | ORAL_CAPSULE | Freq: Every day | ORAL | 3 refills | Status: DC

## 2021-01-17 NOTE — Telephone Encounter (Signed)
Copied from CRM [id number]. Topic: General - Other >> Jan 17, 2021  9:41 AM Glean Salen wrote: Reason for CRM: Patient called asking can she be prescribed an antidepressant before her appt on 08/30. Please call back

## 2021-01-17 NOTE — Telephone Encounter (Signed)
Pt has sent a MyChart message. Provider has been responding

## 2021-02-28 ENCOUNTER — Other Ambulatory Visit: Payer: Self-pay

## 2021-02-28 ENCOUNTER — Encounter: Payer: Self-pay | Admitting: Nurse Practitioner

## 2021-02-28 ENCOUNTER — Ambulatory Visit: Payer: Medicaid Other | Attending: Nurse Practitioner | Admitting: Nurse Practitioner

## 2021-02-28 DIAGNOSIS — F419 Anxiety disorder, unspecified: Secondary | ICD-10-CM

## 2021-02-28 DIAGNOSIS — F32A Depression, unspecified: Secondary | ICD-10-CM

## 2021-02-28 NOTE — Progress Notes (Addendum)
Virtual Visit via Telephone Note Due to national recommendations of social distancing due to COVID 19, telehealth visit is felt to be most appropriate for this patient at this time.  I discussed the limitations, risks, security and privacy concerns of performing an evaluation and management service by telephone and the availability of in person appointments. I also discussed with the patient that there may be a patient responsible charge related to this service. The patient expressed understanding and agreed to proceed.    I connected with Mallory Craig on 02/28/21  at   2:10 PM EDT  EDT by telephone and verified that I am speaking with the correct person using two identifiers.  Location of Patient: Private Residence   [facility] of Provider: [facility]    Persons participating in Telemedicine visit: Bertram Denver FNP-BC Mallory Craig    History of Present Illness: Telemedicine visit for: Establish Care She has a past medical history of Allergy, Anxiety, Asthma, and Depression.   Has has Post partum depression and anxiety with the birth of all 4 of her children. 3 of them are school aged and 1 is almost 28 years old. She has tried several different medications in the past for her anxiety and depression however she reports undesirable side effects from all medications tried.  Side effects with zoloft and Buspar: "brain zaps" or feeling like she was being electrocuted.  Other medications tried include: cymbalta, celexa, ambien, lexapro. She declines starting SSRI or anxiolytic today. Would prefer referral to behavioral health or social worker. She is currently uninsured. Endorses Postpartum depression and anxiety after the birth of all 4 of her children. Anxiety worsened after giving birth to a stillborn child 07/2015. She has panic attacks several times per week.  She withdraws and gets irritable and moody when she has panic attacks. Constantly worries about  things. Saw a couple therapists when she was pregnant with 2nd daughter. Talking about past events made vomiting associated with pregnancy worse.  2nd therapist was unreliable and not responsive to her needs.  Depression screen [facility] 2/9 02/28/2021 01/16/2021 10/04/2020 10/16/2016 08/29/2015  Decreased Interest 0 2 0 0 3  Down, Depressed, Hopeless 1 2 1  0 3  PHQ - 2 Score 1 4 1  0 6  Altered sleeping 0 0 - - 0  Tired, decreased energy 1 1 - - 2  Change in appetite 0 0 - - 0  Feeling bad or failure about yourself  1 2 - - 3  Trouble concentrating 0 3 - - 0  Moving slowly or fidgety/restless 0 0 - - 0  Suicidal thoughts 0 3 - - 0  PHQ-9 Score 3 13 - - 11  Difficult doing work/chores Not difficult at all - - - Very difficult    GAD 7 : Generalized Anxiety Score 02/28/2021 01/16/2021 10/04/2020  Nervous, Anxious, on Edge 1 3 3   Control/stop worrying 1 3 3   Worry too much - different things 0 3 2  Trouble relaxing 0 2 1  Restless 0 1 1  Easily annoyed or irritable 1 3 3   Afraid - awful might happen 0 1 0  Total GAD 7 Score 3 16 13   Anxiety Difficulty Not difficult at all - -       Past Medical History:  Diagnosis Date   Allergy    Claritin   Anxiety    Asthma    Childhood; last Albuterol use age 35   Depression    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 first  child    Past Surgical History:  Procedure Laterality Date   ADENOIDECTOMY     NO PAST SURGERIES     WISDOM TOOTH EXTRACTION      Family History  Problem Relation Age of Onset   Cancer Mother        skin cancer   Arthritis Mother    Hyperthyroidism Mother    Cancer Maternal Grandmother        Lung   Cancer Maternal Grandfather    Asthma Brother     Social History   Socioeconomic History   Marital status: Married    Spouse name: Not on file   Number of children: 4   Years of education: Not on file   Highest education level: 12th grade  Occupational History   Occupation: stay at home  Tobacco Use   Smoking status: Never    Smokeless tobacco: Never  Vaping Use   Vaping Use: Never used  Substance and Sexual Activity   Alcohol use: Yes    Alcohol/week: 0.0 standard drinks    Comment: occasionally   Drug use: No   Sexual activity: Yes    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  HENT: Negative.  Negative for nosebleeds.   Eyes: Negative.  Negative for blurred vision, double vision and photophobia.  Respiratory: Negative.  Negative for cough and shortness of breath.   Cardiovascular: Negative.  Negative for chest pain, palpitations and leg swelling.  Gastrointestinal: Negative.  Negative for heartburn, nausea and vomiting.  Musculoskeletal: Negative.  Negative for myalgias.  Neurological: Negative.  Negative for dizziness, focal weakness, seizures and headaches.  Psychiatric/Behavioral:  Positive for depression. Negative for suicidal ideas. The patient is nervous/anxious.    Assessment and Plan: Mallory Craig was seen today for establish care.  Diagnoses and all orders for this visit:  Anxiety and depression -     Ambulatory referral to Integrated Behavioral Health -     Ambulatory referral to Social Work    Follow Up Instructions Return in about 8 weeks (around 04/25/2021) for physical.     I discussed the assessment and treatment plan with the patient. The patient was provided an opportunity to ask questions and all were answered. The patient agreed with the plan and demonstrated an understanding of the instructions.   The patient was  advised to call back or seek an in-person evaluation if the symptoms worsen or if the condition fails to improve as anticipated.  I provided 13 minutes of non-face-to-face time during this encounter including median intraservice time, reviewing previous notes, labs, imaging, medications and explaining diagnosis and management.  Claiborne Rigg, FNP-BC

## 2021-04-12 ENCOUNTER — Encounter: Payer: Self-pay | Admitting: Nurse Practitioner

## 2021-04-12 ENCOUNTER — Other Ambulatory Visit: Payer: Self-pay

## 2021-04-12 ENCOUNTER — Ambulatory Visit: Payer: Medicaid Other | Attending: Nurse Practitioner | Admitting: Nurse Practitioner

## 2021-04-12 VITALS — BP 124/85 | HR 83 | Ht 67.0 in | Wt 163.0 lb

## 2021-04-12 DIAGNOSIS — Z3202 Encounter for pregnancy test, result negative: Secondary | ICD-10-CM | POA: Diagnosis not present

## 2021-04-12 DIAGNOSIS — Z1159 Encounter for screening for other viral diseases: Secondary | ICD-10-CM | POA: Diagnosis not present

## 2021-04-12 DIAGNOSIS — Z0000 Encounter for general adult medical examination without abnormal findings: Secondary | ICD-10-CM

## 2021-04-12 DIAGNOSIS — Z30011 Encounter for initial prescription of contraceptive pills: Secondary | ICD-10-CM

## 2021-04-12 DIAGNOSIS — D72829 Elevated white blood cell count, unspecified: Secondary | ICD-10-CM

## 2021-04-12 DIAGNOSIS — Z0001 Encounter for general adult medical examination with abnormal findings: Secondary | ICD-10-CM

## 2021-04-12 DIAGNOSIS — M2012 Hallux valgus (acquired), left foot: Secondary | ICD-10-CM | POA: Diagnosis not present

## 2021-04-12 LAB — POCT URINE PREGNANCY: Preg Test, Ur: NEGATIVE

## 2021-04-12 MED ORDER — NORGESTIM-ETH ESTRAD TRIPHASIC 0.18/0.215/0.25 MG-25 MCG PO TABS
1.0000 | ORAL_TABLET | Freq: Every day | ORAL | 3 refills | Status: DC
  Filled 2021-04-12: qty 28, 28d supply, fill #0
  Filled 2021-05-03: qty 84, 84d supply, fill #1

## 2021-04-12 NOTE — Progress Notes (Signed)
Assessment & Plan:  Mallory Craig was seen today for establish care.  Diagnoses and all orders for this visit:  Annual physical exam  Hallux valgus of left foot -     Ambulatory referral to Podiatry  Oral contraceptive prescribed -     Norgestimate-Ethinyl Estradiol Triphasic (ORTHO TRI-CYCLEN LO) 0.18/0.215/0.25 MG-25 MCG tab; Take 1 tablet by mouth daily. Please fill as a 90 day supply -     POCT urine pregnancy  Leukocytosis, unspecified type -     CMP14+EGFR -     CBC with Differential  Need for hepatitis C screening test -     HCV Ab w Reflex to Quant PCR   Patient has been counseled on age-appropriate routine health concerns for screening and prevention. These are reviewed and up-to-date. Referrals have been placed accordingly. Immunizations are up-to-date or declined.    Subjective:   Chief Complaint  Patient presents with   Establish Care   HPI Mallory Craig 30 y.o. female presents to office today for annual physical. She has a past medical history of Allergy, Anxiety, Asthma, and Depression.   GU Endorses Menorrhagia, bloating, mood lability and dysmenorrhea. Is agreeable to starting OCPs today.    Blood pressure is well controlled. Denies chest pain, shortness of breath, palpitations, lightheadedness, dizziness, headaches or BLE edema.   BP Readings from Last 3 Encounters:  04/12/21 124/85  10/04/20 110/76  08/11/19 120/80    Anxiety and Depression Declines SSRI or anxiolytic at this time.  Review of Systems  Constitutional:  Negative for fever, malaise/fatigue and weight loss.  HENT: Negative.  Negative for nosebleeds.   Eyes: Negative.  Negative for blurred vision, double vision and photophobia.  Respiratory: Negative.  Negative for cough and shortness of breath.   Cardiovascular: Negative.  Negative for chest pain, palpitations and leg swelling.  Gastrointestinal: Negative.  Negative for heartburn, nausea and vomiting.  Genitourinary: Negative.         SEE HPI  Musculoskeletal: Negative.  Negative for myalgias.  Skin: Negative.   Neurological: Negative.  Negative for dizziness, focal weakness, seizures and headaches.  Endo/Heme/Allergies: Negative.   Psychiatric/Behavioral:  Positive for depression. Negative for suicidal ideas. The patient is nervous/anxious.    Past Medical History:  Diagnosis Date   Allergy    Claritin   Anxiety    Asthma    Childhood; last Albuterol use age 3   Depression    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 first child    Past Surgical History:  Procedure Laterality Date   ADENOIDECTOMY     NO PAST SURGERIES     WISDOM TOOTH EXTRACTION      Family History  Problem Relation Age of Onset   Cancer Mother        skin cancer   Arthritis Mother    Hyperthyroidism Mother    Cancer Maternal Grandmother        Lung   Cancer Maternal Grandfather    Asthma Brother     Social History Reviewed with no changes to be made today.   No outpatient medications prior to visit.   No facility-administered medications prior to visit.    Allergies  Allergen Reactions   Tape Hives    Had an epidural and had a piece of tape used and caused hives.   Bee Venom Swelling       Objective:    BP 124/85   Pulse 83   Ht _0  (1.702 m)   Wt 163 lb (73.9 kg)  LMP 03/26/2021 (Exact Date)   SpO2 99%   Breastfeeding No   BMI 25.53 kg/m  Wt Readings from Last 3 Encounters:  04/12/21 163 lb (73.9 kg)  10/04/20 154 lb 9.6 oz (70.1 kg)  08/11/19 163 lb (73.9 kg)    Physical Exam Constitutional:      Appearance: She is well-developed.  HENT:     Head: Normocephalic and atraumatic.     Right Ear: External ear normal. There is impacted cerumen.     Left Ear: Tympanic membrane, ear canal and external ear normal.     Nose: Nose normal.     Mouth/Throat:     Pharynx: No oropharyngeal exudate.  Eyes:     General: No scleral icterus.       Right eye: No discharge.     Conjunctiva/sclera: Conjunctivae normal.      Pupils: Pupils are equal, round, and reactive to light.  Neck:     Thyroid: No thyromegaly.     Trachea: No tracheal deviation.  Cardiovascular:     Rate and Rhythm: Normal rate and regular rhythm.     Pulses:          Dorsalis pedis pulses are 2+ on the right side and 2+ on the left side.     Heart sounds: Normal heart sounds. No murmur heard.   No friction rub.  Pulmonary:     Effort: Pulmonary effort is normal. No accessory muscle usage or respiratory distress.     Breath sounds: Normal breath sounds. No decreased breath sounds, wheezing, rhonchi or rales.  Chest:     Chest wall: No tenderness.  Breasts:    Breasts are symmetrical.     Right: No inverted nipple, mass, nipple discharge, skin change or tenderness.     Left: No inverted nipple, mass, nipple discharge, skin change or tenderness.  Abdominal:     General: Bowel sounds are normal. There is no distension.     Palpations: Abdomen is soft. There is no mass.     Tenderness: There is no abdominal tenderness. There is no guarding or rebound.  Musculoskeletal:        General: No tenderness. Normal range of motion.     Cervical back: Normal range of motion and neck supple.     Left foot: Deformity and bunion present.  Lymphadenopathy:     Cervical: No cervical adenopathy.  Skin:    General: Skin is warm and dry.     Findings: No erythema.  Neurological:     Mental Status: She is alert and oriented to person, place, and time.     Cranial Nerves: No cranial nerve deficit.     Coordination: Coordination normal.     Deep Tendon Reflexes: Reflexes are normal and symmetric.  Psychiatric:        Speech: Speech normal.        Behavior: Behavior normal.        Thought Content: Thought content normal.        Judgment: Judgment normal.         Patient has been counseled extensively about nutrition and exercise as well as the importance of adherence with medications and regular follow-up. The patient was given clear  instructions to go to ER or return to medical center if symptoms don't improve, worsen or new problems develop. The patient verbalized understanding.   Follow-up: Return if symptoms worsen or fail to improve.   [name], FNP-BC [place]  [place], [place]   04/12/2021, 1:40 PM

## 2021-04-13 LAB — CBC WITH DIFFERENTIAL/PLATELET
Basophils Absolute: 0.1 10*3/uL (ref 0.0–0.2)
Basos: 1 %
EOS (ABSOLUTE): 0.1 10*3/uL (ref 0.0–0.4)
Eos: 2 %
Hematocrit: 42.6 % (ref 34.0–46.6)
Hemoglobin: 14.2 g/dL (ref 11.1–15.9)
Immature Grans (Abs): 0 10*3/uL (ref 0.0–0.1)
Immature Granulocytes: 1 %
Lymphocytes Absolute: 1 10*3/uL (ref 0.7–3.1)
Lymphs: 12 %
MCH: 28.7 pg (ref 26.6–33.0)
MCHC: 33.3 g/dL (ref 31.5–35.7)
MCV: 86 fL (ref 79–97)
Monocytes Absolute: 0.5 10*3/uL (ref 0.1–0.9)
Monocytes: 7 %
Neutrophils Absolute: 6.2 10*3/uL (ref 1.4–7.0)
Neutrophils: 77 %
Platelets: 189 10*3/uL (ref 150–450)
RBC: 4.95 x10E6/uL (ref 3.77–5.28)
RDW: 12.8 % (ref 11.7–15.4)
WBC: 7.9 10*3/uL (ref 3.4–10.8)

## 2021-04-13 LAB — CMP14+EGFR
ALT: 10 IU/L (ref 0–32)
AST: 13 IU/L (ref 0–40)
Albumin/Globulin Ratio: 2 (ref 1.2–2.2)
Albumin: 4.6 g/dL (ref 3.9–5.0)
Alkaline Phosphatase: 59 IU/L (ref 44–121)
BUN/Creatinine Ratio: 14 (ref 9–23)
BUN: 9 mg/dL (ref 6–20)
Bilirubin Total: 0.5 mg/dL (ref 0.0–1.2)
CO2: 23 mmol/L (ref 20–29)
Calcium: 9.9 mg/dL (ref 8.7–10.2)
Chloride: 105 mmol/L (ref 96–106)
Creatinine, Ser: 0.63 mg/dL (ref 0.57–1.00)
Globulin, Total: 2.3 g/dL (ref 1.5–4.5)
Glucose: 82 mg/dL (ref 70–99)
Potassium: 4.2 mmol/L (ref 3.5–5.2)
Sodium: 143 mmol/L (ref 134–144)
Total Protein: 6.9 g/dL (ref 6.0–8.5)
eGFR: 124 mL/min/{1.73_m2} (ref >59)

## 2021-04-13 LAB — HCV AB W REFLEX TO QUANT PCR: HCV Ab: 0.1 s/co ratio (ref 0.0–0.9)

## 2021-04-13 LAB — HCV INTERPRETATION

## 2021-04-14 ENCOUNTER — Ambulatory Visit: Payer: Medicaid Other | Admitting: Podiatry

## 2021-04-14 ENCOUNTER — Ambulatory Visit (INDEPENDENT_AMBULATORY_CARE_PROVIDER_SITE_OTHER): Payer: Medicaid Other

## 2021-04-14 ENCOUNTER — Other Ambulatory Visit: Payer: Self-pay

## 2021-04-14 DIAGNOSIS — M2012 Hallux valgus (acquired), left foot: Secondary | ICD-10-CM

## 2021-04-14 DIAGNOSIS — M79672 Pain in left foot: Secondary | ICD-10-CM | POA: Diagnosis not present

## 2021-04-14 DIAGNOSIS — Z01818 Encounter for other preprocedural examination: Secondary | ICD-10-CM | POA: Diagnosis not present

## 2021-04-19 NOTE — Progress Notes (Signed)
Subjective:  Patient ID: Mallory Craig, female    DOB: Oct 12, 1990,  MRN: [id number]  Chief Complaint  Patient presents with   Bunions    Left foot bunion causing some discomfort     30 y.o. female presents with the above complaint.  Patient presents with complaint of painful left bunion deformity.  Patient states it causes her some discomfort especially with ambulation.  She has tried conservative treatment options and has failed them all.  She would like to discuss treatment options for this.  She has not seen anyone else prior to seeing me.  She denies any other acute complaints.  She is interested in surgical intervention.  Hurts with ambulation pain scale is 8 out of 10 dull achy in nature.  Has made any shoe gear modification  Review of Systems: Negative except as noted in the HPI. Denies N/V/F/Ch.  Past Medical History:  Diagnosis Date   Allergy    Claritin   Anxiety    Asthma    Childhood; last Albuterol use age 36   Depression    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 first child    Current Outpatient Medications:    Norgestimate-Ethinyl Estradiol Triphasic (ORTHO TRI-CYCLEN LO) 0.18/0.215/0.25 MG-25 MCG tab, Take 1 tablet by mouth daily. Please fill as a 90 day supply, Disp: 90 tablet, Rfl: 3  Social History   Tobacco Use  Smoking Status Never  Smokeless Tobacco Never    Allergies  Allergen Reactions   Tape Hives    Had an epidural and had a piece of tape used and caused hives.   Bee Venom Swelling   Objective:  There were no vitals filed for this visit. There is no height or weight on file to calculate BMI. Constitutional Well developed. Well nourished.  Vascular Dorsalis pedis pulses palpable bilaterally. Posterior tibial pulses palpable bilaterally. Capillary refill normal to all digits.  No cyanosis or clubbing noted. Pedal hair growth normal.  Neurologic Normal speech. Oriented to person, place, and time. Epicritic sensation to light touch grossly present  bilaterally.  Dermatologic Nails well groomed and normal in appearance. No open wounds. No skin lesions.  Orthopedic: Normal joint ROM without pain or crepitus bilaterally. Hallux abductovalgus deformity present no first MPJ intra-articular pain noted to the left side.  Palpable medial eminence Left 1st MPJ diminished range of motion. Left 1st TMT without gross hypermobility. Right 1st MPJ full range of motion  Right 1st TMT without gross hypermobility. Lesser digital contractures absent bilaterally.   Radiographs: Taken and reviewed. Hallux abductovalgus deformity present. Metatarsal parabola normal. 1st/2nd IMA: Moderate; TSP: 5 out of 7.  Assessment:   1. Hallux abducto valgus, left   2. Acquired hallux interphalangeus, left   3. Preoperative examination    Plan:  Patient was evaluated and treated and all questions answered.  Hallux abductovalgus deformity, left side -XR as above. -Patient has failed all conservative therapy and wishes to proceed with surgical intervention. All risks, benefits, and alternatives discussed with patient. No guarantees given. Consent reviewed and signed by patient. Post-op course explained at length. -Planned procedures: Chevron osteotomy with possible phalangeal osteotomy -Risk factors: None -I discussed my preoperative intraoperative postoperative plan in extensive detail.  Patient would like to proceed despite all risks.  She can be weightbearing as tolerated in cam boot -Informed surgical risk consent was reviewed and read aloud to the patient.  I reviewed the films.  I have discussed my findings with the patient in great detail.  I have discussed all risks including  but not limited to infection, stiffness, scarring, limp, disability, deformity, damage to blood vessels and nerves, numbness, poor healing, need for braces, arthritis, chronic pain, amputation, death.  All benefits and realistic expectations discussed in great detail.  I have made no  promises as to the outcome.  I have provided realistic expectations.  I have offered the patient a 2nd opinion, which they have declined and assured me they preferred to proceed despite the risks   No follow-ups on file.

## 2021-05-03 ENCOUNTER — Other Ambulatory Visit: Payer: Self-pay

## 2021-05-08 ENCOUNTER — Encounter: Payer: Self-pay | Admitting: Nurse Practitioner

## 2021-05-09 ENCOUNTER — Ambulatory Visit (HOSPITAL_BASED_OUTPATIENT_CLINIC_OR_DEPARTMENT_OTHER): Payer: Medicaid Other | Admitting: Nurse Practitioner

## 2021-05-09 ENCOUNTER — Other Ambulatory Visit: Payer: Self-pay

## 2021-05-09 DIAGNOSIS — Z30011 Encounter for initial prescription of contraceptive pills: Secondary | ICD-10-CM | POA: Diagnosis not present

## 2021-05-09 MED ORDER — LEVONORGESTREL-ETHINYL ESTRAD 90-20 MCG PO TABS
1.0000 | ORAL_TABLET | Freq: Every day | ORAL | 3 refills | Status: DC
  Filled 2021-05-09: qty 84, 84d supply, fill #0

## 2021-05-09 NOTE — Progress Notes (Signed)
Virtual Visit via Telephone Note Due to national recommendations of social distancing due to COVID 19, telehealth visit is felt to be most appropriate for this patient at this time.  I discussed the limitations, risks, security and privacy concerns of performing an evaluation and management service by telephone and the availability of in person appointments. I also discussed with the patient that there may be a patient responsible charge related to this service. The patient expressed understanding and agreed to proceed.    I connected with Mallory Craig on 05/09/21  at   9:10 AM EST  EDT by telephone and verified that I am speaking with the correct person using two identifiers.  Location of Patient: Private Residence   Location of Provider: [facility]    Persons participating in Telemedicine visit: Bertram Denver FNP-BC Mallory Craig    History of Present Illness: Telemedicine visit for: Side effects to OCP  Currently experiencing nausea and vomiting along with breakthrough bleeding since starting OCPs 3 weeks ago. I have instructed her that it may take a few months for symptoms to subside. She is also interested in taking a birth control pill that prevents her from having a menstrual cycle. States her PMS symptoms are severe and include agitation, cramping, headaches and mood swings. Other contraception in the past has included IUD which she states she could constantly feel the strings and so she had the IUD removed.     Past Medical History:  Diagnosis Date   Allergy    Claritin   Anxiety    Asthma    Childhood; last Albuterol use age 44   Depression    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 first child    Past Surgical History:  Procedure Laterality Date   ADENOIDECTOMY     NO PAST SURGERIES     WISDOM TOOTH EXTRACTION      Family History  Problem Relation Age of Onset   Cancer Mother        skin cancer   Arthritis Mother    Hyperthyroidism  Mother    Cancer Maternal Grandmother        Lung   Cancer Maternal Grandfather    Asthma Brother     Social History   Socioeconomic History   Marital status: Married    Spouse name: Not on file   Number of children: 4   Years of education: Not on file   Highest education level: 12th grade  Occupational History   Occupation: stay at home  Tobacco Use   Smoking status: Never   Smokeless tobacco: Never  Vaping Use   Vaping Use: Never used  Substance and Sexual Activity   Alcohol use: Yes    Alcohol/week: 0.0 standard drinks    Comment: occasionally   Drug use: No   Sexual activity: Yes    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  HENT: Negative.  Negative for nosebleeds.   Eyes: Negative.  Negative for blurred vision, double  vision and photophobia.  Respiratory: Negative.  Negative for cough and shortness of breath.   Cardiovascular: Negative.  Negative for chest pain, palpitations and leg swelling.  Gastrointestinal: Negative.  Negative for heartburn, nausea and vomiting.  Musculoskeletal: Negative.  Negative for myalgias.  Neurological: Negative.  Negative for dizziness, focal weakness, seizures and headaches.  Psychiatric/Behavioral: Negative.  Negative for suicidal ideas.    Assessment and Plan: Diagnoses and all orders for this visit:  Oral contraceptive prescribed -     levonorgestrel-ethinyl estradiol (AMETHYST) 90-20 MCG tablet; Take 1 tablet by mouth daily. Start ethinyl estradiol/levonorgestrel on the  day a new pack of the previous COC would be started.    Follow Up Instructions Return if symptoms worsen or fail to improve.     I discussed the assessment and treatment plan with the patient. The patient was provided an opportunity to ask questions and all were answered. The patient agreed with the plan and demonstrated an understanding of the instructions.   The patient was advised to call back or seek an in-person evaluation if the symptoms worsen or if the condition fails to improve as anticipated.  I provided 10 minutes of non-face-to-face time during this encounter including median intraservice time, reviewing previous notes, labs, imaging, medications and explaining diagnosis and management.  Claiborne Rigg, FNP-BC

## 2021-05-10 ENCOUNTER — Other Ambulatory Visit: Payer: Self-pay

## 2021-05-10 ENCOUNTER — Encounter: Payer: Self-pay | Admitting: Nurse Practitioner

## 2021-05-11 ENCOUNTER — Other Ambulatory Visit: Payer: Self-pay

## 2021-05-17 ENCOUNTER — Other Ambulatory Visit: Payer: Self-pay | Admitting: Nurse Practitioner

## 2021-05-17 ENCOUNTER — Encounter: Payer: Self-pay | Admitting: Nurse Practitioner

## 2021-05-17 ENCOUNTER — Other Ambulatory Visit: Payer: Self-pay

## 2021-05-17 MED ORDER — NORGESTIM-ETH ESTRAD TRIPHASIC 0.18/0.215/0.25 MG-25 MCG PO TABS
1.0000 | ORAL_TABLET | Freq: Every day | ORAL | 11 refills | Status: DC
  Filled 2021-05-17: qty 28, 28d supply, fill #0

## 2021-05-31 ENCOUNTER — Encounter: Payer: Medicaid Other | Admitting: Podiatry

## 2021-06-14 ENCOUNTER — Encounter: Payer: Medicaid Other | Admitting: Podiatry

## 2021-07-21 ENCOUNTER — Ambulatory Visit (INDEPENDENT_AMBULATORY_CARE_PROVIDER_SITE_OTHER): Payer: Medicaid Other | Admitting: Licensed Clinical Social Worker

## 2021-07-21 DIAGNOSIS — F411 Generalized anxiety disorder: Secondary | ICD-10-CM | POA: Diagnosis not present

## 2021-07-21 NOTE — Progress Notes (Signed)
Comprehensive Clinical Assessment (CCA) Note  07/21/2021 Mallory Craig [id number]  Chief Complaint:  Chief Complaint  Patient presents with   Anxiety    Stress    Visit Diagnosis: GAD  Virtual Visit via Video Note  I connected with Mallory Craig on 07/21/21 at  9:00 AM EST by a video enabled telemedicine application and verified that I am speaking with Mallory correct person using two identifiers.  Location: Patient: Mallory Craig  Provider: Providers Home    I discussed Mallory limitations of evaluation and management by telemedicine and Mallory availability of in person appointments. Mallory patient expressed understanding and agreed to proceed.  Client is a 31 year old female. Client is referred by Mallory Craig for an anxiety Client states mental health symptoms as evidenced by:  Depression Difficulty Concentrating; Irritability Difficulty Concentrating; Irritability  Duration of Depressive Symptoms Greater than two weeks Greater than two weeks  Mania None None  Anxiety Difficulty concentrating; Irritability; Restlessness; Sleep; Tension; Worrying; Fatigue Difficulty concentrating; Irritability; Restlessness; Sleep; Tension; Worrying; Fatigue  Psychosis None None  Trauma None None  Obsessions None None  Compulsions None None  Inattention None None  Hyperactivity/Impulsivity None None  Oppositional/Defiant Behaviors None None  Emotional Irregularity None None     Client denies suicidal and homicidal ideations currently  Client denies hallucinations and delusions currently   Client was screened for Mallory following SDOH: depression   Assessment Information that integrates subjective and objective details with a therapist's professional interpretation:    Pt was alert and oriented x 5. She was dressed casually and engaged well in therapy session. Pt presented with anxious mood/affect. She was pleasant, cooperative, and maintained good eye contact.   Pt presented  today with referral from Mallory Craig for anxiety. Pt endorses symptoms for restless, irritability, tension, and worry. She states that her primary stressor are for her 5 children, grief/loss, family conflict, and relationship. 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 She is Mallory primary caregiver in her family as her spouse works full time. She states that is difficult to manage Mallory housing needs, take Craig of Mallory kids, and go to school at Mallory same time. Mallory Craig states as a goal she wants better time management skills and to be better organized.  She report that her support system include her sister-in-law and her spouse. Pt states that her and her spouse struggle with depression and anxiety. They do not have great family support as her parents do not like him and his parents do not like her. [name] states that she had a a still born child in 2017 and this was unexpected. Pt would like to f/u with therapy 1 x monthly for stress management and process through grief/loss     Client meets criteria for: Anxiety    Client states use of Mallory following substances: None reported      Clinician assisted client with scheduling Mallory following appointments: 4 weeks. Clinician details of appointment.    Client agreed with treatment recommendations.     I discussed Mallory assessment and treatment plan with Mallory patient. Mallory patient was provided an opportunity to ask questions and all were answered. Mallory patient agreed with Mallory plan and demonstrated an understanding of Mallory instructions.   Mallory patient was advised to call back or seek an in-person evaluation if Mallory symptoms worsen or if Mallory condition fails to improve as anticipated.  I provided 40 minutes of non-face-to-face time during this encounter.   Mallory Craig  Mallory Favila, LCSW    CCA Screening, Triage and Referral (STR)  Patient Reported Information How did you hear about [place]? Primary Craig  Referral name:  Mallory Craig at Mallory Craig  Practice/Facility Name: Mallory Craig through Mallory Craig.  Practice/Facility Phone Number: No data recorded Name of Contact: Mallory Patten NP  Contact Number: No data recorded Contact Fax Number: No data recorded Prescriber Name: No data recorded Prescriber Address (if known): No data recorded  What Is Mallory Reason for Your Visit/Call Today? No data recorded How Long Has This Been Causing You Problems? No data recorded What Do You Feel Would Help You Mallory Most Today? Treatment for Depression or other mood problem   Have You Recently Been in Any Inpatient Treatment (Hospital/Detox/Crisis Craig/28-Day Program)? No  Name/Location of Program/Hospital:No data recorded How Long Were You There? No data recorded When Were You Discharged? No data recorded  Have You Ever Received Services From Mallory Craig Before? Yes  Who Do You See at Mallory Craig? Mallory Craig   Have You Recently Had Any Thoughts About Hurting Yourself? No  Are You Planning to Commit Suicide/Harm Yourself At This time? No   Have you Recently Had Thoughts About [place]? No  Explanation: No data recorded  Have You Used Any Alcohol or Drugs in Mallory Past 24 Hours? No  How Long Ago Did You Use Drugs or Alcohol? No data recorded What Did You Use and How Much? No data recorded  Do You Currently Have a Therapist/Psychiatrist? No  Name of Therapist/Psychiatrist: No data recorded  Have You Been Recently Discharged From Any Office Practice or Programs? No  Explanation of Discharge From Practice/Program: No data recorded    CCA Screening Triage Referral Assessment Type of Contact: Tele-Assessment  Is this Initial or Reassessment? Initial Assessment  Date Telepsych consult ordered in Mallory Craig:  07/21/21  Time Telepsych consult ordered in Mallory Craig:  0917   Patient  Reported Information Reviewed? No data recorded Patient Left Without Being Seen? No data recorded Reason for Not Completing Assessment: No data recorded  Collateral Involvement: No data recorded  Does Patient Have a [place]? No data recorded Name and Contact of Legal Guardian: No data recorded If Minor and Not Living with Parent(s), Who has Custody? No data recorded Is CPS involved or ever been involved? Never  Is APS involved or ever been involved? Never   Patient Determined To Be At Risk for Harm To Self or Others Based on Review of Patient Reported Information or Presenting Complaint? No  Method: No data recorded Availability of Means: No data recorded Intent: No data recorded Notification Required: No data recorded Additional Information for Danger to Others Potential: No data recorded Additional Comments for Danger to Others Potential: No data recorded Are There Guns or Other Weapons in Your Home? No data recorded Types of Guns/Weapons: No data recorded Are These Weapons Safely Secured?                            No data recorded Who Could Verify You Are Able To Have These Secured: No data recorded Do You Have any Outstanding Charges, Pending Court Dates, Parole/Probation? No data recorded Contacted To Inform of Risk of Harm To Self or Others: No data recorded  Location of Assessment: [facility]   Does Patient Present under  Involuntary Commitment? No  IVC Papers Initial File Date: No data recorded  [place] of Residence: [place]   Patient Currently Receiving Mallory Following Services: No data recorded  Determination of Need: No data recorded  Options For Referral: No data recorded    CCA Biopsychosocial Intake/Chief Complaint:  anxiety and stress  Current Symptoms/Problems: restless, irritably, rapid thoughts, tension, worry,   Patient Reported Schizophrenia/Schizoaffective Diagnosis in Past: No   Strengths: Strong  support system with family  Preferences: none reported  Abilities: No data recorded  Type of Services Patient Feels are Needed: therapy   Initial Clinical Notes/Concerns: stress and tension   Mental Health Symptoms Depression:   Difficulty Concentrating; Irritability   Duration of Depressive symptoms:  Greater than two weeks   Mania:   None   Anxiety:    Difficulty concentrating; Irritability; Restlessness; Sleep; Tension; Worrying; Fatigue   Psychosis:   None   Duration of Psychotic symptoms: No data recorded  Trauma:   None   Obsessions:   None   Compulsions:   None   Inattention:   None   Hyperactivity/Impulsivity:   None   Oppositional/Defiant Behaviors:   None   Emotional Irregularity:   None   Other Mood/Personality Symptoms:  No data recorded   Mental Status Exam Appearance and self-Craig  Stature:   Average   Weight:   Average weight   Clothing:   Casual   Grooming:   Normal   Cosmetic use:   Age appropriate   Posture/gait:   Normal   Motor activity:   Not Remarkable   Sensorium  Attention:   Normal   Concentration:   Normal   Orientation:   X5   Recall/memory:   Normal   Affect and Mood  Affect:   Anxious   Mood:   Anxious   Relating  Eye contact:   Normal   Facial expression:   Anxious   Attitude toward examiner:   Cooperative   Thought and Language  Speech flow:  Clear and Coherent   Thought content:   Appropriate to Mood and Circumstances   Preoccupation:   None   Hallucinations:   None   Organization:  No data recorded  [employer] of Knowledge:   Fair   Intelligence:   Average   Abstraction:   Concrete   Judgement:   Good   Reality Testing:   Realistic   Insight:   Present   Decision Making:   Normal   Social Functioning  Social Maturity:   Responsible   Social Judgement:   Normal   Stress  Stressors:   Grief/losses; Relationship; Family  conflict; School   Coping Ability:   Exhausted; Overwhelmed   Skill Deficits:  No data recorded  Supports:   Family     Religion: Religion/Spirituality Are You A Religious Person?: No  Leisure/Recreation: Leisure / Recreation Do You Have Hobbies?: Yes Leisure and Hobbies: making t shirt designs, spending time with her children.  Exercise/Diet: Exercise/Diet Do You Exercise?: Yes What Type of Exercise Do You Do?: Weight Training How Many Times a Week Do You Exercise?: 1-3 times a week Have You Gained or Lost A Significant Amount of Weight in Mallory Past Six Months?: No Do You Follow a Special Diet?: No Do You Have Any Trouble Sleeping?: No   CCA Employment/Education Employment/Work Situation: Employment / Work Situation Employment Situation: Unemployed Patient's Job has Been Impacted by Current Illness: No Has Patient ever Been in Passenger transport manager?: No  Education:  Education Is Patient Currently Attending School?: Yes School Currently Attending: [place] Last Grade Completed: 12 Did You Graduate From [employer]?: Yes Did You Attend College?: Yes What Type of College Degree Do you Have?: taking classes toward childhood education Did You Have An Individualized Education Program (IIEP): No Did You Have Any Difficulty At School?: No Patient's Education Has Been Impacted by Current Illness: No   CCA Family/Childhood History Family and Relationship History: Family history Marital status: Married Number of Years Married: 60 What types of issues is patient dealing with in Mallory relationship?: both struggle with depression and anxiety. Are you sexually active?: Yes What is your sexual orientation?: hetrosexual Has your sexual activity been affected by drugs, alcohol, medication, or emotional stress?: none reported Does patient have children?: Yes How many children?: 5 How is patient's relationship with their children?: good. 1 child was still born in 2017  Childhood History:   Childhood History By whom was/is Mallory patient raised?: Both parents Description of patient's relationship with caregiver when they were a child: difficult during teenage years as her parents were stressed and there was finicial issues Patient's description of current relationship with people who raised him/her: Tense due to pt relationship with spouse they do not like him Does patient have siblings?: Yes Number of Siblings: 2 Description of patient's current relationship with siblings: does not talk due to having there own lifes Did patient suffer any verbal/emotional/physical/sexual abuse as a child?: No Did patient suffer from severe childhood neglect?: No Has patient ever been sexually abused/assaulted/raped as an adolescent or adult?: No Was Mallory patient ever a victim of a crime or a disaster?: No Witnessed domestic violence?: No Has patient been affected by domestic violence as an adult?: No  Child/Adolescent Assessment:     CCA Substance Use Alcohol/Drug Use: Alcohol / Drug Use History of alcohol / drug use?: No history of alcohol / drug abuse                         ASAM's:  Six Dimensions of Multidimensional Assessment  Dimension 1:  Acute Intoxication and/or Withdrawal Potential:      Dimension 2:  Biomedical Conditions and Complications:      Dimension 3:  Emotional, Behavioral, or Cognitive Conditions and Complications:     Dimension 4:  Readiness to Change:     Dimension 5:  Relapse, Continued use, or Continued Problem Potential:     Dimension 6:  Recovery/Living Environment:     ASAM Severity Score:    ASAM Recommended Level of Treatment:     Substance use Disorder (SUD)    Recommendations for Services/Supports/Treatments:    DSM5 Diagnoses: Patient Active Problem List   Diagnosis Date Noted   COVID-19 vaccine dose declined 10/04/2020   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

## 2021-07-21 NOTE — Plan of Care (Signed)
Pt agreeable to plan  ?

## 2021-08-14 ENCOUNTER — Other Ambulatory Visit (HOSPITAL_COMMUNITY)
Admission: RE | Admit: 2021-08-14 | Discharge: 2021-08-14 | Disposition: A | Discharge status: Home / Self Care | Payer: Medicaid Other | Source: Ambulatory Visit | Attending: Obstetrics and Gynecology | Admitting: Obstetrics and Gynecology

## 2021-08-14 ENCOUNTER — Ambulatory Visit: Payer: Medicaid Other | Admitting: Obstetrics and Gynecology

## 2021-08-14 ENCOUNTER — Other Ambulatory Visit: Payer: Self-pay

## 2021-08-14 ENCOUNTER — Encounter: Payer: Self-pay | Admitting: Obstetrics and Gynecology

## 2021-08-14 VITALS — BP 106/62 | Ht 67.0 in | Wt 166.0 lb

## 2021-08-14 DIAGNOSIS — Z124 Encounter for screening for malignant neoplasm of cervix: Secondary | ICD-10-CM

## 2021-08-14 DIAGNOSIS — R87610 Atypical squamous cells of undetermined significance on cytologic smear of cervix (ASC-US): Secondary | ICD-10-CM

## 2021-08-14 DIAGNOSIS — N921 Excessive and frequent menstruation with irregular cycle: Secondary | ICD-10-CM

## 2021-08-14 DIAGNOSIS — Z30011 Encounter for initial prescription of contraceptive pills: Secondary | ICD-10-CM

## 2021-08-14 MED ORDER — MICROGESTIN 24 FE 1-20 MG-MCG PO TABS: 1.0000 | ORAL_TABLET | Freq: Every day | ORAL | 0 refills | Status: DC

## 2021-08-14 MED ORDER — IBUPROFEN 800 MG PO TABS: 800.0000 mg | ORAL_TABLET | Freq: Three times a day (TID) | ORAL | 0 refills | Status: DC | PRN

## 2021-08-14 NOTE — Progress Notes (Signed)
Mallory Pounds, NP   Chief Complaint  Patient presents with   Contraception    Wants a BC that helps with heavy and painful cycles x 2+ yrs    HPI:      Mallory Craig is a 31 y.o. [id number] whose LMP was Patient's last menstrual period was 07/26/2021 (approximate)., presents today for [facility] consult for menses. Menses are monthly, lasting 5-7 days, mod to heavy flow, occas BTB, mod to severe dysmen, NSAIDs without relief. Has to lie down while taking care of kids at home. No relief with heating pad; hot shower helps.  Took OTC Lo from PCP for 2 months but had nausea entire time. Did other OCPs in past and did well. Had IUD in past but didn't like it.   She is sex active currently, not using BC.   07/01/19 pap was ASCUS/neg HPV DNA. Repeat pap due. Pt to lose MCD insurance 4/23.   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   GAD (generalized anxiety disorder) 10/04/2020   Postpartum care following vaginal delivery 05/18/2019   Normal labor 05/17/2019   History of IUFD 05/15/2019   Family history of genetic disorder 06/09/2015   Supervision of other normal pregnancy, antepartum 03/23/2015   Anxiety and depression 02/14/2015    Past Surgical History:  Procedure Laterality Date   ADENOIDECTOMY     WISDOM TOOTH EXTRACTION      Family History  Problem Relation Age of Onset   Cancer Mother        skin cancer, 16s   Arthritis Mother    Hyperthyroidism Mother    Asthma Brother    Breast cancer Maternal Grandmother        88s   Cancer Maternal Grandmother        Lung   Cancer Maternal Grandfather    Breast cancer Maternal Great-grandmother        78s    Social History   Socioeconomic History   Marital status: Married    Spouse name: Not on file   Number of children: 4   Years of  education: Not on file   Highest education level: 12th grade  Occupational History   Occupation: stay at home  Tobacco Use   Smoking status: Never   Smokeless tobacco: Never  Vaping Use   Vaping Use: Never used  Substance and Sexual Activity   Alcohol use: Yes    Alcohol/week: 0.0 standard drinks    Comment: occasionally   Drug use: No   Sexual activity: Yes    Partners: Male    Birth control/protection: None, Spermicide  Other Topics Concern   Not on file  Social History Narrative   Not on file   Social Determinants of Health   Financial Resource Strain: Low Risk    Difficulty of Paying Living Expenses: Not hard at all  Food Insecurity: No Food Insecurity   Worried About Charity fundraiser in the Last Year: Never true   [place] in the Last Year: Never true  Transportation Needs: No Transportation Needs   Lack of Transportation (Medical): No   Lack of Transportation (Non-Medical): No  Physical Activity: Inactive   Days of Exercise per Week: 0 days   Minutes of Exercise per Session: 0 min  Stress: Stress Concern Present  Feeling of Stress : To some extent  Social Connections: Moderately Isolated   Frequency of Communication with Friends and Family: Once a week   Frequency of Social Gatherings with Friends and Family: Once a week   Attends Religious Services: 1 to 4 times per year   Active Member of [employer] or Organizations: No   Attends Music therapist: Not on file   Marital Status: Married  Human resources officer Violence: Not At Risk   Fear of Current or Ex-Partner: No   Emotionally Abused: No   Physically Abused: No   Sexually Abused: No    Outpatient Medications Prior to Visit  Medication Sig Dispense Refill   Norgestimate-Ethinyl Estradiol Triphasic (ORTHO TRI-CYCLEN LO) 0.18/0.215/0.25 MG-25 MCG tab Take 1 tablet by mouth daily. 28 tablet 11   No facility-administered medications prior to visit.      ROS:  Review of Systems   Constitutional:  Negative for fever.  Gastrointestinal:  Negative for blood in stool, constipation, diarrhea, nausea and vomiting.  Genitourinary:  Negative for dyspareunia, dysuria, flank pain, frequency, hematuria, urgency, vaginal bleeding, vaginal discharge and vaginal pain.  Musculoskeletal:  Negative for back pain.  Skin:  Negative for rash.  BREAST: No symptoms   OBJECTIVE:   Vitals:  BP 106/62    Ht 5\' 7"  (1.702 m)    Wt 166 lb (75.3 kg)    LMP 07/26/2021 (Approximate)    BMI 26.00 kg/m   Physical Exam Vitals reviewed.  Constitutional:      Appearance: She is well-developed.  Pulmonary:     Effort: Pulmonary effort is normal.  Genitourinary:    General: Normal vulva.     Pubic Area: No rash.      Labia:        Right: No rash, tenderness or lesion.        Left: No rash, tenderness or lesion.      Vagina: Normal. No vaginal discharge, erythema or tenderness.     Cervix: Normal.     Uterus: Normal. Not enlarged and not tender.      Adnexa: Right adnexa normal and left adnexa normal.       Right: No mass or tenderness.         Left: No mass or tenderness.    Musculoskeletal:        General: Normal range of motion.     Cervical back: Normal range of motion.  Skin:    General: Skin is warm and dry.  Neurological:     General: No focal deficit present.     Mental Status: She is alert and oriented to person, place, and time.  Psychiatric:        Mood and Affect: Mood normal.        Behavior: Behavior normal.        Thought Content: Thought content normal.        Judgment: Judgment normal.    Assessment/Plan: Encounter for initial prescription of contraceptive pills - Plan: Norethindrone Acetate-Ethinyl Estrad-FE (MICROGESTIN 24 FE) 1-20 MG-MCG(24) tablet; BC options discussed. Pt wants to try different OCPs. Rx lomedia eRxd for 3 months. Pt to see if nausea improves. If not, can also do Lo Loestrin (but may then be self pay) or try nuvaring since not taken orally.  F/u in 3 months with sx/sooner prn. Condoms now and then for 1 wk.   Menometrorrhagia - Plan: Norethindrone Acetate-Ethinyl Estrad-FE (MICROGESTIN 24 FE) 1-20 MG-MCG(24) tablet, ibuprofen (ADVIL) 800 MG tablet; try OCPs. Rx  motrin for dysmen. F/u prn.   Cervical cancer screening - Plan: Cytology - PAP  ASCUS of cervix with negative high risk HPV - Plan: Cytology - PAP; repeat pap today.    Meds ordered this encounter  Medications   Norethindrone Acetate-Ethinyl Estrad-FE (MICROGESTIN 24 FE) 1-20 MG-MCG(24) tablet    Sig: Take 1 tablet by mouth daily.    Dispense:  84 tablet    Refill:  0    Order Specific Question:   Supervising Provider    Answer:   Gae Dry [id number]   ibuprofen (ADVIL) 800 MG tablet    Sig: Take 1 tablet (800 mg total) by mouth every 8 (eight) hours as needed.    Dispense:  60 tablet    Refill:  0    Order Specific Question:   Supervising Provider    Answer:   Gae Dry [id number]   WILL BE SELF PAY 4/23   Return if symptoms worsen or fail to improve.  Nyoka Alcoser B. Naomee Nowland, PA-C 08/14/2021 11:37 AM

## 2021-08-16 LAB — CYTOLOGY - PAP
Diagnosis: NEGATIVE
Diagnosis: REACTIVE

## 2021-08-25 ENCOUNTER — Ambulatory Visit (HOSPITAL_COMMUNITY): Payer: Medicaid Other | Admitting: Licensed Clinical Social Worker

## 2021-09-14 ENCOUNTER — Ambulatory Visit (HOSPITAL_COMMUNITY): Payer: Medicaid Other | Admitting: Licensed Clinical Social Worker

## 2021-10-30 ENCOUNTER — Other Ambulatory Visit: Payer: Self-pay | Admitting: Obstetrics and Gynecology

## 2021-10-30 DIAGNOSIS — Z30011 Encounter for initial prescription of contraceptive pills: Secondary | ICD-10-CM

## 2021-10-30 DIAGNOSIS — N921 Excessive and frequent menstruation with irregular cycle: Secondary | ICD-10-CM

## 2022-11-12 ENCOUNTER — Emergency Department (HOSPITAL_COMMUNITY): Payer: No Typology Code available for payment source

## 2022-11-12 ENCOUNTER — Encounter (HOSPITAL_COMMUNITY): Payer: Self-pay

## 2022-11-12 ENCOUNTER — Emergency Department (HOSPITAL_COMMUNITY)
Admission: EM | Admit: 2022-11-12 | Discharge: 2022-11-12 | Disposition: A | Discharge status: Home / Self Care | Payer: No Typology Code available for payment source | Attending: Emergency Medicine | Admitting: Emergency Medicine

## 2022-11-12 DIAGNOSIS — S79929A Unspecified injury of unspecified thigh, initial encounter: Secondary | ICD-10-CM | POA: Diagnosis not present

## 2022-11-12 DIAGNOSIS — M79652 Pain in left thigh: Secondary | ICD-10-CM | POA: Insufficient documentation

## 2022-11-12 DIAGNOSIS — J45909 Unspecified asthma, uncomplicated: Secondary | ICD-10-CM | POA: Insufficient documentation

## 2022-11-12 DIAGNOSIS — R110 Nausea: Secondary | ICD-10-CM | POA: Diagnosis not present

## 2022-11-12 DIAGNOSIS — R0789 Other chest pain: Secondary | ICD-10-CM | POA: Diagnosis not present

## 2022-11-12 DIAGNOSIS — S80919A Unspecified superficial injury of unspecified knee, initial encounter: Secondary | ICD-10-CM | POA: Diagnosis not present

## 2022-11-12 DIAGNOSIS — M25562 Pain in left knee: Secondary | ICD-10-CM | POA: Diagnosis not present

## 2022-11-12 DIAGNOSIS — Y92410 Unspecified street and highway as the place of occurrence of the external cause: Secondary | ICD-10-CM | POA: Diagnosis not present

## 2022-11-12 DIAGNOSIS — R42 Dizziness and giddiness: Secondary | ICD-10-CM | POA: Insufficient documentation

## 2022-11-12 DIAGNOSIS — R58 Hemorrhage, not elsewhere classified: Secondary | ICD-10-CM | POA: Diagnosis not present

## 2022-11-12 MED ORDER — ONDANSETRON HCL 4 MG PO TABS: 4.0000 mg | ORAL_TABLET | Freq: Four times a day (QID) | ORAL | 0 refills | Status: DC | PRN

## 2022-11-12 MED ORDER — ONDANSETRON 4 MG PO TBDP
4.0000 mg | ORAL_TABLET | Freq: Once | ORAL | Status: AC
  Administered 2022-11-12: 4 mg via ORAL
  Filled 2022-11-12: qty 1

## 2022-11-12 MED ORDER — CYCLOBENZAPRINE HCL 5 MG PO TABS: 5.0000 mg | ORAL_TABLET | Freq: Two times a day (BID) | ORAL | 0 refills | Status: DC | PRN

## 2022-11-12 MED ORDER — IBUPROFEN 400 MG PO TABS
600.0000 mg | ORAL_TABLET | Freq: Once | ORAL | Status: AC
  Administered 2022-11-12: 600 mg via ORAL
  Filled 2022-11-12: qty 1
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
## 2022-11-12 NOTE — Discharge Instructions (Signed)
Return to the ED with any new or worsening signs or symptoms You may take muscle relaxer 2 times daily as needed.  Please be aware that these will cause you to become sedated.  Please take this later in the evening before bedtime Please take Zofran every 6 hours as needed for nausea You may take ibuprofen every 6 hours, 600 mg as needed for pain Please see the attached work note

## 2022-11-12 NOTE — ED Notes (Signed)
Taken to xray by transport  

## 2023-01-28 ENCOUNTER — Ambulatory Visit (INDEPENDENT_AMBULATORY_CARE_PROVIDER_SITE_OTHER): Payer: Medicaid Other | Admitting: Obstetrics

## 2023-01-28 ENCOUNTER — Encounter: Payer: Self-pay | Admitting: Obstetrics

## 2023-01-28 ENCOUNTER — Other Ambulatory Visit: Payer: Self-pay

## 2023-01-28 ENCOUNTER — Encounter: Payer: Self-pay | Admitting: Nurse Practitioner

## 2023-01-28 VITALS — BP 115/79 | HR 71 | Resp 16 | Ht 67.0 in | Wt 152.2 lb

## 2023-01-28 DIAGNOSIS — Z3202 Encounter for pregnancy test, result negative: Secondary | ICD-10-CM | POA: Diagnosis not present

## 2023-01-28 DIAGNOSIS — Z309 Encounter for contraceptive management, unspecified: Secondary | ICD-10-CM | POA: Diagnosis not present

## 2023-01-28 LAB — POCT URINE PREGNANCY: Preg Test, Ur: NEGATIVE

## 2023-01-28 MED ORDER — MISOPROSTOL 200 MCG PO TABS
200.0000 ug | ORAL_TABLET | Freq: Once | ORAL | 0 refills | Status: DC
  Filled 2023-01-28: qty 1, 1d supply, fill #0

## 2023-01-28 NOTE — Progress Notes (Signed)
    GYNECOLOGY PROGRESS NOTE  Subjective:    Patient ID: Mallory Craig, female    DOB: June 28, 1991, 32 y.o.   MRN: [id number]  HPI  Patient is a 32 y.o. [phone] female who presents for contraceptive management. She was on OCP last year and she stopped taking those because of the emotional side effects and anxiety she was having. Patient's last menstrual period was 12/31/2022 (approximate). She reports that her cycles effect her mentally as well. She has considered  trying the depo provera injections or the patch.  She has had four vaginal births. Feels that combo OCPs cause her mood issues. She is presently counseling to address both anxiety and depressive sxs. Also has irritability prior to her periods each month, and feels very angry  cyclically and her counselor has indicated that PMDD is likely. She has not responded well to Lexapro or Zoloft.  She is interested in a method that will lighten her heavy , regular cycles and not cause more depression or anxiety.   The following portions of the patient's history were reviewed and updated as appropriate: allergies, current medications, past family history, past medical history, past social history, past surgical history, and problem list.  Review of Systems Pertinent items noted in HPI and remainder of comprehensive ROS otherwise negative.   Objective:   Blood pressure 115/79, pulse 71, resp. rate 16, height 5\' 7"  (1.702 m), weight 152 lb 3.2 oz (69 kg), last menstrual period 12/31/2022. Body mass index is 23.84 kg/m. General appearance: alert, cooperative, and no distress Abdomen: soft, non-tender; bowel sounds normal; no masses,  no organomegaly Pelvic: Deferred for this visit. Contraceptive counsel Extremities: extremities normal, atraumatic, no cyanosis or edema Neurologic: Grossly normal   Assessment:   1. Encounter for contraceptive management, unspecified type    Previous sensitivity to Estrogen bearing birth control. 2.  Suspect PMDD Plan:  We discussed the difference between OCPs, that include Estrogen, and LARCs. She wants something long term that will not negatively affect her mood, so we focused on Depo or the Mirena.  After much discussion. She now plans on a trial of the Mirena. I have prescribed some Cytotec for her and advised her to make the appointment when she is on  her next period. She is advised to take Motrin 600 mg po before the appointment, take the Cytotec in the morning before same appt.  I have also suggested she discuss a trial of Fluoxetine to address what I suspect may be PMDD symptoms.Encouraged her to discuss this with her PCP prescriber.  Mirna Mires, CNM  01/28/2023 5:03 PM         Paula Compton, CNM [facility]

## 2023-01-29 ENCOUNTER — Other Ambulatory Visit: Payer: Self-pay | Admitting: Nurse Practitioner

## 2023-01-29 ENCOUNTER — Other Ambulatory Visit: Payer: Self-pay

## 2023-01-29 MED ORDER — FLUOXETINE HCL 10 MG PO CAPS
10.0000 mg | ORAL_CAPSULE | Freq: Every day | ORAL | 3 refills | Status: DC
  Filled 2023-01-29: qty 90, 90d supply, fill #0

## 2023-02-01 ENCOUNTER — Encounter: Payer: Self-pay | Admitting: Obstetrics

## 2023-02-05 ENCOUNTER — Other Ambulatory Visit: Payer: Self-pay

## 2023-02-15 ENCOUNTER — Ambulatory Visit: Payer: Medicaid Other | Admitting: Obstetrics

## 2023-05-17 ENCOUNTER — Encounter: Payer: Self-pay | Admitting: Obstetrics

## 2023-05-17 ENCOUNTER — Ambulatory Visit: Payer: Medicaid Other | Admitting: Obstetrics

## 2023-05-17 VITALS — BP 106/77 | HR 69 | Ht 67.0 in | Wt 144.0 lb

## 2023-05-17 DIAGNOSIS — Z3202 Encounter for pregnancy test, result negative: Secondary | ICD-10-CM

## 2023-05-17 DIAGNOSIS — Z3009 Encounter for other general counseling and advice on contraception: Secondary | ICD-10-CM

## 2023-05-17 DIAGNOSIS — Z30017 Encounter for initial prescription of implantable subdermal contraceptive: Secondary | ICD-10-CM

## 2023-05-17 DIAGNOSIS — F419 Anxiety disorder, unspecified: Secondary | ICD-10-CM

## 2023-05-17 DIAGNOSIS — Z30430 Encounter for insertion of intrauterine contraceptive device: Secondary | ICD-10-CM

## 2023-05-17 LAB — POCT URINE PREGNANCY: Preg Test, Ur: NEGATIVE

## 2023-05-17 MED ORDER — HYDROXYZINE PAMOATE 50 MG PO CAPS: 50.0000 mg | ORAL_CAPSULE | Freq: Once | ORAL | 0 refills | Status: AC

## 2023-05-17 MED ORDER — FLUOXETINE HCL 10 MG PO CAPS: 10.0000 mg | ORAL_CAPSULE | Freq: Every day | ORAL | 3 refills | Status: AC

## 2023-05-17 MED ORDER — MISOPROSTOL 200 MCG PO TABS: 200.0000 ug | ORAL_TABLET | Freq: Once | ORAL | 0 refills | Status: AC

## 2023-05-17 NOTE — Progress Notes (Signed)
Obstetrics & Gynecology Office Visit   Chief Complaint:  Chief Complaint  Patient presents with   Contraception    Nexplanon vs Depo due to depression.    History of Present Illness: Mallory Craig presents today to consider birth control. Initially she made an appointment for Nexplanon placement. She is sexually active; has had 4 children, and shares that she does not want any  more children. She also shares that she is very nervous about getting a LARC like the IUD or the implant. She also mentions that she struggles with angry moods, especially before her period, and that she was prescribed low dose Prozac by her PCP this season , but did not start the medication because her therapist told her to focus on getting hormonal birth control first.  She has watched several videos of IUD insertion and Nexplanon placement, and this has made her feel very anxious about placement.   Review of Systems:  Review of Systems  Constitutional: Negative.   HENT: Negative.    Eyes: Negative.   Respiratory: Negative.    Gastrointestinal: Negative.   Genitourinary: Negative.   Musculoskeletal: Negative.   Skin: Negative.   Neurological: Negative.   Endo/Heme/Allergies: Negative.   Psychiatric/Behavioral:  The patient is nervous/anxious.      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  Obstetric History: [id number]  Family History:  Family History  Problem Relation Age of Onset   Cancer Mother        skin cancer, 89s   Arthritis Mother    Hyperthyroidism Mother    Asthma Brother    Breast cancer Maternal Grandmother         26s   Cancer Maternal Grandmother        Lung   Cancer Maternal Grandfather    Breast cancer Maternal Great-grandmother        63s    Social History:  Social History   Socioeconomic History   Marital status: Married    Spouse name: Not on file   Number of children: 4   Years of education: Not on file   Highest education level: 12th grade  Occupational History   Occupation: stay at home  Tobacco Use   Smoking status: Never   Smokeless tobacco: Never  Vaping Use   Vaping status: Never Used  Substance and Sexual Activity   Alcohol use: Yes    Alcohol/week: 0.0 standard drinks of alcohol    Comment: occasionally   Drug use: No   Sexual activity: Yes    Partners: Male    Birth control/protection: None, Spermicide  Other Topics Concern   Not on file  Social History Narrative   Not on file   Social Determinants of Health   Financial Resource Strain: Low Risk  (07/21/2021)   Overall Financial Resource Strain (CARDIA)    Difficulty of Paying Living Expenses: Not hard at all  Food Insecurity: No Food Insecurity (07/21/2021)   Hunger Vital Sign    Worried About Running Out of Food in the Last Year: Never true  Ran Out of Food in the Last Year: Never true  Transportation Needs: No Transportation Needs (07/21/2021)   PRAPARE - Administrator, Civil Service (Medical): No    Lack of Transportation (Non-Medical): No  Physical Activity: Inactive (07/21/2021)   Exercise Vital Sign    Days of Exercise per Week: 0 days    Minutes of Exercise per Session: 0 min  Stress: Stress Concern Present (07/21/2021)   [employer] of Occupational Health - Occupational Stress Questionnaire    Feeling of Stress : To some extent  Social Connections: Moderately Isolated (07/21/2021)   Social Connection and Isolation Panel [NHANES]    Frequency of Communication with Friends and Family: Once a week    Frequency of Social Gatherings with Friends and Family: Once a week     Attends Religious Services: 1 to 4 times per year    Active Member of [employer] or Organizations: No    Attends Banker Meetings: Not on file    Marital Status: Married  Catering manager Violence: Not At Risk (07/21/2021)   Humiliation, Afraid, Rape, and Kick questionnaire    Fear of Current or Ex-Partner: No    Emotionally Abused: No    Physically Abused: No    Sexually Abused: No    Allergies:  Allergies  Allergen Reactions   Tape Hives    Had an epidural and had a piece of tape used and caused hives.   Bee Venom Swelling    Medications: Prior to Admission medications   Medication Sig Start Date End Date Taking? Authorizing Provider  hydrOXYzine (VISTARIL) 50 MG capsule Take 1 capsule (50 mg total) by mouth once for 1 dose. 05/17/23 05/17/23 Yes Mirna Mires, CNM  misoprostol (CYTOTEC) 200 MCG tablet Place 1 tablet (200 mcg total) vaginally once for 1 dose. At bedtime evening prior to procedure 05/17/23 05/17/23 Yes Mirna Mires, CNM  FLUoxetine (PROZAC) 10 MG capsule Take 1 capsule (10 mg total) by mouth daily. 05/17/23   Mirna Mires, CNM    Physical Exam Vitals:  Vitals:   05/17/23 1431  BP: 106/77  Pulse: 69   Patient's last menstrual period was 04/22/2023 (approximate).  Physical Exam Constitutional:      Appearance: Normal appearance. She is normal weight.  HENT:     Head: Normocephalic and atraumatic.  Cardiovascular:     Rate and Rhythm: Normal rate and regular rhythm.  Pulmonary:     Effort: Pulmonary effort is normal.     Breath sounds: Normal breath sounds.  Abdominal:     Palpations: Abdomen is soft.  Genitourinary:    General: Normal vulva.     Rectum: Normal.     Comments: No external lesions or areas of irritation. Spec exam- cervix easily visualized. Uterus is anteverted, non enlarged, and deviates slightly to her left. Some difficulty with patient tension with spec placement Musculoskeletal:     Cervical back: Normal  range of motion and neck supple.  Neurological:     Mental Status: She is alert.      Assessment: 32 y.o. [id number] to discuss contraception and develop plan PMDD symtoms  Plan: Problem List Items Addressed This Visit   None Visit Diagnoses     Encounter for initial prescrition of IUD    -  Primary   Relevant Orders   POCT urine pregnancy (Completed)   Encounter for IUD insertion       Relevant Medications   hydrOXYzine (VISTARIL) 50 MG capsule  misoprostol (CYTOTEC) 200 MCG tablet   Anxiety       Relevant Medications   hydrOXYzine (VISTARIL) 50 MG capsule   FLUoxetine (PROZAC) 10 MG capsule     Reviewed all forms of birth control options available including abstinence; over the counter/barrier methods; hormonal contraceptive medication including pill, patch, ring, injection,contraceptive implant; hormonal and nonhormonal IUDs; permanent sterilization options including vasectomy and the various tubal sterilization modalities. Risks and benefits reviewed.  Questions were answered.  Information was given to patient to review.    Ultimately we plan on her getting a Mirena next week. Vistaril ordered for her to take before her appointment. Cytotec 200 mcg tablet also ordered to be placed the night before, or morning of her IUD appt. Recommended she see MM[facility] or Dr. Marice Potter for this appointment.  Mirna Mires, CNM  05/17/2023 4:08 PM

## 2023-05-28 ENCOUNTER — Ambulatory Visit: Payer: Medicaid Other | Admitting: Obstetrics & Gynecology

## 2023-06-04 ENCOUNTER — Ambulatory Visit: Payer: Medicaid Other | Admitting: Advanced Practice Midwife

## 2023-06-13 ENCOUNTER — Ambulatory Visit: Payer: Medicaid Other

## 2023-11-13 ENCOUNTER — Ambulatory Visit: Payer: Self-pay | Admitting: Certified Nurse Midwife

## 2023-11-13 VITALS — BP 147/84 | HR 88 | Resp 16 | Ht 67.0 in | Wt 146.4 lb

## 2023-11-13 DIAGNOSIS — Z30430 Encounter for insertion of intrauterine contraceptive device: Secondary | ICD-10-CM | POA: Diagnosis not present

## 2023-11-13 MED ORDER — LEVONORGESTREL 20 MCG/DAY IU IUD
1.0000 | INTRAUTERINE_SYSTEM | Freq: Once | INTRAUTERINE | Status: AC
  Administered 2023-11-13: 1 via INTRAUTERINE

## 2023-11-13 NOTE — Progress Notes (Signed)
   ENCOUNTER FOR IUD INSERTION   Subjective  Mallory Craig is a 33 y.o. [phone] who presents today for IUD insertion. She desires reversible long-term contraception. We have thoroughly reviewed the risks, benefits, and alternatives, and she has elected to proceed with Mirena  insertion.   Objective BP (!) 147/84   Pulse 88   Resp 16   Ht 5\' 7"  (1.702 m)   Wt 146 lb 6.4 oz (66.4 kg)   LMP 11/01/2023 (Approximate)   BMI 22.93 kg/m   UPT: n/a - currently on OCPs with recent cycle  Pelvic exam: normal external genitalia, vulva, vagina, cervix, uterus and adnexa.   Procedure Note Consent was obtained prior to the procedure. A bimanual exam was performed to determine the position of the uterus. A sterile speculum was placed in the vagina, and the cervix was visualized. Betadine was applied to the cervix. An tenaculum was placed on the anterior lip of the cervix, and gentle traction was applied to straighten and stabilize it. The uterus was sounded to about 7 cm. The IUD was inserted to the appropriate depth and the insertion tool was removed. The strings were trimmed to about 3 cm. Bleeding was minimal. Patient tolerated the procedure well. Post-procedure care and warning signs were reviewed with patient  Follow up for annual visit or PRN.  Donato Fu, CNM

## 2023-12-09 ENCOUNTER — Encounter: Payer: Self-pay | Admitting: Obstetrics and Gynecology

## 2023-12-10 ENCOUNTER — Other Ambulatory Visit: Payer: Self-pay

## 2023-12-10 ENCOUNTER — Other Ambulatory Visit: Payer: Self-pay | Admitting: Certified Nurse Midwife

## 2023-12-10 MED ORDER — NORETHINDRONE ACETATE 5 MG PO TABS
5.0000 mg | ORAL_TABLET | Freq: Two times a day (BID) | ORAL | 0 refills | Status: AC
  Filled 2023-12-10: qty 42, 21d supply, fill #0

## 2023-12-10 NOTE — Telephone Encounter (Signed)
This is your pt. Thank you

## 2024-01-01 ENCOUNTER — Encounter: Payer: Self-pay | Admitting: Obstetrics and Gynecology

## 2024-01-09 ENCOUNTER — Ambulatory Visit

## 2024-01-09 VITALS — BP 122/86 | HR 85 | Ht 67.0 in | Wt 147.2 lb

## 2024-01-09 DIAGNOSIS — R300 Dysuria: Secondary | ICD-10-CM | POA: Diagnosis not present

## 2024-01-09 LAB — POCT URINALYSIS DIPSTICK
Bilirubin, UA: NEGATIVE
Glucose, UA: NEGATIVE
Ketones, UA: NEGATIVE
Nitrite, UA: NEGATIVE
Protein, UA: NEGATIVE
Spec Grav, UA: <=1.005 — AB (ref 1.010–1.025)
Urobilinogen, UA: 0.2 U/dL
pH, UA: 7 (ref 5.0–8.0)

## 2024-01-09 MED ORDER — SULFAMETHOXAZOLE-TRIMETHOPRIM 800-160 MG PO TABS: 1.0000 | ORAL_TABLET | Freq: Two times a day (BID) | ORAL | 0 refills | Status: AC

## 2024-01-09 NOTE — Progress Notes (Signed)
    NURSE VISIT NOTE  Subjective:    Patient ID: Cheron Coryell, female    DOB: 10-08-1990, 33 y.o.   MRN: [id number]       HPI  Patient is a 33 y.o. [id number] female who presents for dysuria, flank pain, and urinary retention for 2 hrs.  Patient denies hematuria, pelvic pain, cloudy malordorous urine, genital rash, genital irritation, and vaginal discharge.  Patient does have a history of recurrent UTI.  Patient does not have a history of pyelonephritis.    Objective:    BP 122/86   Pulse 85   Ht 5' 7 (1.702 m)   Wt 147 lb 3.2 oz (66.8 kg)   LMP  (LMP Unknown)   BMI 23.05 kg/m    Lab Review  Results for orders placed or performed in visit on 01/09/24  POCT urinalysis dipstick  Result Value Ref Range   Color, UA yellow    Clarity, UA cloudy    Glucose, UA Negative Negative   Bilirubin, UA negative    Ketones, UA negative    Spec Grav, UA <=1.005 (A) 1.010 - 1.025   Blood, UA large    pH, UA 7.0 5.0 - 8.0   Protein, UA Negative Negative   Urobilinogen, UA 0.2 0.2 or 1.0 E.U./dL   Nitrite, UA negative    Leukocytes, UA Moderate (2+) (A) Negative   Appearance     Odor      Assessment:   1. Dysuria      Plan:   Urine Culture Sent. Treatment  Bactrim  DS 1 PO BID for 7 days. Maintain adequate hydration.  Follow up if symptoms worsen or fail to improve as anticipated, and as needed.    Rollo JINNY Maxin, CMA

## 2024-01-09 NOTE — Patient Instructions (Signed)
 Urinary Tract Infection, Female A urinary tract infection (UTI) is an infection in your urinary tract. The urinary tract is made up of organs that make, store, and get rid of pee (urine) in your body. These organs include: The kidneys. The ureters. The bladder. The urethra. What are the causes? Most UTIs are caused by germs called bacteria. They may be in or near your genitals. These germs grow and cause swelling in your urinary tract. What increases the risk? You're more likely to get a UTI if: You're a female. The urethra is shorter in females than in males. You have a soft tube called a catheter that drains your pee. You can't control when you pee or poop. You have trouble peeing because of: A kidney stone. A urinary blockage. A nerve condition that affects your bladder. Not getting enough to drink. You're sexually active. You use a birth control inside your vagina, like spermicide. You're pregnant. You have low levels of the hormone estrogen in your body. You're an older adult. You're also more likely to get a UTI if you have other health problems. These may include: Diabetes. A weak immune system. Your immune system is your body's defense system. Sickle cell disease. Injury of the spine. What are the signs or symptoms? Symptoms may include: Needing to pee right away. Peeing small amounts often. Pain or burning when you pee. Blood in your pee. Pee that smells bad or odd. Pain in your belly or lower back. You may also: Feel confused. This may be the first symptom in older adults. Vomit. Not feel hungry. Feel tired or easily annoyed. Have a fever or chills. How is this diagnosed? A UTI is diagnosed based on your medical history and an exam. You may also have other tests. These may include: Pee tests. Blood tests. Tests for sexually transmitted infections (STIs). If you've had more than one UTI, you may need to have imaging studies done to find out why you keep getting  them. How is this treated? A UTI can be treated by: Taking antibiotics or other medicines. Drinking enough fluid to keep your pee pale yellow. In rare cases, a UTI can cause a very bad condition called sepsis. Sepsis may be treated in the hospital. Follow these instructions at home: Medicines Take your medicines only as told by your health care provider. If you were given antibiotics, take them as told by your provider. Do not stop taking them even if you start to feel better. General instructions Make sure you: Pee often and fully. Do not hold your pee for a long time. Wipe from front to back after you pee or poop. Use each tissue only once when you wipe. Pee after you have sex. Do not douche or use sprays or powders in your genital area. Contact a health care provider if: Your symptoms don't get better after 1-2 days of taking antibiotics. Your symptoms go away and then come back. You have a fever or chills. You vomit or feel like you may vomit. Get help right away if: You have very bad pain in your back or lower belly. You faint. This information is not intended to replace advice given to you by your health care provider. Make sure you discuss any questions you have with your health care provider. Document Revised: 05/29/2023 Document Reviewed: 09/21/2022 Elsevier Patient Education  2025 [employer]  Sulfamethoxazole ; Trimethoprim  Tablets What is this medication? SULFAMETHOXAZOLE ; TRIMETHOPRIM  (suhl fuh meth OK suh zohl; trye METH oh prim) treats infections caused  by bacteria. It belongs to a group of medications called sulfonamide antibiotics. It will not treat colds, the flu, or infections caused by viruses. This medicine may be used for other purposes; ask your health care provider or pharmacist if you have questions. COMMON BRAND NAME(S): Bacter-Aid DS, Bactrim , Bactrim  DS, Septra , Septra  DS What should I tell my care team before I take this medication? They need to know  if you have any of these conditions: G6PD deficiency HIV or AIDS Kidney disease Liver disease Low platelet levels Low red blood cell levels Poor nutrition Stomach or intestine problems, such as colitis Thyroid  disease An unusual or allergic reaction to sulfamethoxazole , trimethoprim , other medications, foods, dyes, or preservatives Pregnant or trying to get pregnant Breast-feeding How should I use this medication? Take this medication by mouth with a glass of water . Follow the directions on the prescription label. Take your medication at regular intervals. Do not take it more often than directed. Take all of your medication as directed even if you think you are better. Do not skip doses or stop your medication early. Talk to your care team about the use of this medication in children. While this medication may be prescribed for children as young as 2 months for selected conditions, precautions do apply. Overdosage: If you think you have taken too much of this medicine contact a poison control center or emergency room at once. NOTE: This medicine is only for you. Do not share this medicine with others. What if I miss a dose? If you miss a dose, take it as soon as you can. If it is almost time for your next dose, take only that dose. Do not take double or extra doses. What may interact with this medication? Do not take this medication with any of the following: Dofetilide This medication may also interact with the following: Amantadine Certain medications for blood pressure or heart disease Certain medications for depression, such as amitriptyline Certain medications for diabetes, such as glipizide or glyburide Certain medications that treat or prevent blood clots, such as warfarin Cyclosporine Digoxin Diuretics Estrogen and progestin hormones Indomethacin Methotrexate Phenytoin Procainamide Pyrimethamine Zidovudine This list may not describe all possible interactions. Give your  health care provider a list of all the medicines, herbs, non-prescription drugs, or dietary supplements you use. Also tell them if you smoke, drink alcohol, or use illegal drugs. Some items may interact with your medicine. What should I watch for while using this medication? Tell your care team if your symptoms do not start to get better or if they get worse. Do not treat diarrhea with over the counter products. Contact your care team if you have diarrhea that lasts more than 2 days or if it is severe and watery. This medication may cause serious skin reactions. They can happen weeks to months after starting the medication. Contact your care team right away if you notice fevers or flu-like symptoms with a rash. The rash may be red or purple and then turn into blisters or peeling of the skin. Or, you might notice a red rash with swelling of the face, lips or lymph nodes in your neck or under your arms. This medication can make you more sensitive to the sun. Keep out of the sun. If you cannot avoid being in the sun, wear protective clothing and sunscreen. Do not use sun lamps or tanning beds/booths. Be careful brushing or flossing your teeth or using a toothpick because you may get an infection or bleed more  easily. If you have any dental work done, tell your dentist you are receiving this medication. What side effects may I notice from receiving this medication? Side effects that you should report to your care team as soon as possible: Allergic reactions--skin rash, itching, hives, swelling of the face, lips, tongue, or throat Aplastic anemia--unusual weakness or fatigue, dizziness, headache, trouble breathing, increased bleeding or bruising, fever, chills, cough, or sore throat Dry cough, shortness of breath or trouble breathing High potassium level--muscle weakness, fast or irregular heartbeat Liver injury-- right upper belly pain, loss of appetite, nausea, light-colored stool, dark yellow or brown  urine, yellowing skin or eyes, unusual weakness or fatigue Low blood sugar (hypoglycemia)--tremors or shaking, anxiety, sweating, cold or clammy skin, confusion, dizziness, rapid heartbeat Low sodium level--muscle weakness, fatigue, dizziness, headache, confusion Low thyroid  levels (hypothyroidism)--unusual weakness or fatigue, increased sensitivity to cold, constipation, hair loss, dry skin, weight gain, feelings of depression Rash, fever, and swollen lymph nodes Redness, blistering, peeling, or loosening of the skin, including inside the mouth Severe diarrhea, fever Small, pus-filled bumps on skin Unusual vaginal discharge, itching, or odor Side effects that usually do not require medical attention (report to your care team if they continue or are bothersome): Loss of appetite Nausea Vomiting This list may not describe all possible side effects. Call your doctor for medical advice about side effects. You may report side effects to FDA at 1-800-FDA-1088. Where should I keep my medication? Keep out of the reach of children. Store between 15 and 25 degrees C (59 to 77 degrees F). Protect from light. Keep the container tightly closed. Throw away any unused medication after the expiration date. NOTE: This sheet is a summary. It may not cover all possible information. If you have questions about this medicine, talk to your doctor, pharmacist, or health care provider.  2024 Elsevier/Gold Standard (2021-10-10 00:00:00)

## 2024-01-13 LAB — URINE CULTURE

## 2024-01-19 ENCOUNTER — Emergency Department (HOSPITAL_COMMUNITY)
Admission: EM | Admit: 2024-01-19 | Discharge: 2024-01-19 | Disposition: A | Discharge status: Home / Self Care | Attending: Emergency Medicine | Admitting: Emergency Medicine

## 2024-01-19 ENCOUNTER — Other Ambulatory Visit: Payer: Self-pay

## 2024-01-19 ENCOUNTER — Emergency Department (HOSPITAL_COMMUNITY)

## 2024-01-19 ENCOUNTER — Encounter (HOSPITAL_COMMUNITY): Payer: Self-pay

## 2024-01-19 DIAGNOSIS — R739 Hyperglycemia, unspecified: Secondary | ICD-10-CM | POA: Insufficient documentation

## 2024-01-19 DIAGNOSIS — R1084 Generalized abdominal pain: Secondary | ICD-10-CM

## 2024-01-19 DIAGNOSIS — R112 Nausea with vomiting, unspecified: Secondary | ICD-10-CM | POA: Diagnosis not present

## 2024-01-19 DIAGNOSIS — D72829 Elevated white blood cell count, unspecified: Secondary | ICD-10-CM | POA: Diagnosis not present

## 2024-01-19 DIAGNOSIS — E876 Hypokalemia: Secondary | ICD-10-CM | POA: Diagnosis not present

## 2024-01-19 DIAGNOSIS — R17 Unspecified jaundice: Secondary | ICD-10-CM

## 2024-01-19 DIAGNOSIS — R1110 Vomiting, unspecified: Secondary | ICD-10-CM | POA: Diagnosis present

## 2024-01-19 DIAGNOSIS — T7840XA Allergy, unspecified, initial encounter: Secondary | ICD-10-CM | POA: Insufficient documentation

## 2024-01-19 LAB — CBC WITH DIFFERENTIAL/PLATELET
Abs Immature Granulocytes: 0.06 K/uL (ref 0.00–0.07)
Basophils Absolute: 0.1 K/uL (ref 0.0–0.1)
Basophils Relative: 1 %
Eosinophils Absolute: 0.2 K/uL (ref 0.0–0.5)
Eosinophils Relative: 2 %
HCT: 42.6 % (ref 36.0–46.0)
Hemoglobin: 14.1 g/dL (ref 12.0–15.0)
Immature Granulocytes: 1 %
Lymphocytes Relative: 6 %
Lymphs Abs: 0.8 K/uL (ref 0.7–4.0)
MCH: 29.1 pg (ref 26.0–34.0)
MCHC: 33.1 g/dL (ref 30.0–36.0)
MCV: 87.8 fL (ref 80.0–100.0)
Monocytes Absolute: 1 K/uL (ref 0.1–1.0)
Monocytes Relative: 8 %
Neutro Abs: 11.2 K/uL — ABNORMAL HIGH (ref 1.7–7.7)
Neutrophils Relative %: 82 %
Platelets: 180 K/uL (ref 150–400)
RBC: 4.85 MIL/uL (ref 3.87–5.11)
RDW: 12.9 % (ref 11.5–15.5)
WBC: 13.3 K/uL — ABNORMAL HIGH (ref 4.0–10.5)
nRBC: 0 % (ref 0.0–0.2)

## 2024-01-19 LAB — URINALYSIS, ROUTINE W REFLEX MICROSCOPIC
Bilirubin Urine: NEGATIVE
Glucose, UA: NEGATIVE mg/dL
Ketones, ur: 5 mg/dL — AB
Nitrite: NEGATIVE
Protein, ur: NEGATIVE mg/dL
Specific Gravity, Urine: 1.025 (ref 1.005–1.030)
pH: 6 (ref 5.0–8.0)

## 2024-01-19 LAB — COMPREHENSIVE METABOLIC PANEL WITH GFR
ALT: 15 U/L (ref 0–44)
AST: 18 U/L (ref 15–41)
Albumin: 4.1 g/dL (ref 3.5–5.0)
Alkaline Phosphatase: 43 U/L (ref 38–126)
Anion gap: 11 (ref 5–15)
BUN: 14 mg/dL (ref 6–20)
CO2: 21 mmol/L — ABNORMAL LOW (ref 22–32)
Calcium: 8.5 mg/dL — ABNORMAL LOW (ref 8.9–10.3)
Chloride: 105 mmol/L (ref 98–111)
Creatinine, Ser: 0.92 mg/dL (ref 0.44–1.00)
GFR, Estimated: >60 mL/min (ref >60)
Glucose, Bld: 151 mg/dL — ABNORMAL HIGH (ref 70–99)
Potassium: 3.1 mmol/L — ABNORMAL LOW (ref 3.5–5.1)
Sodium: 137 mmol/L (ref 135–145)
Total Bilirubin: 1.6 mg/dL — ABNORMAL HIGH (ref 0.0–1.2)
Total Protein: 7 g/dL (ref 6.5–8.1)

## 2024-01-19 LAB — HCG, SERUM, QUALITATIVE: Preg, Serum: NEGATIVE

## 2024-01-19 LAB — LIPASE, BLOOD: Lipase: 28 U/L (ref 11–51)

## 2024-01-19 MED ORDER — POTASSIUM CHLORIDE CRYS ER 20 MEQ PO TBCR: 20.0000 meq | EXTENDED_RELEASE_TABLET | Freq: Two times a day (BID) | ORAL | 0 refills | Status: AC

## 2024-01-19 MED ORDER — ONDANSETRON HCL 4 MG/2ML IJ SOLN: 4.0000 mg | Freq: Once | INTRAMUSCULAR | Status: DC

## 2024-01-19 MED ORDER — PREDNISONE 50 MG PO TABS: 50.0000 mg | ORAL_TABLET | Freq: Every day | ORAL | 0 refills | Status: AC

## 2024-01-19 MED ORDER — ONDANSETRON 4 MG PO TBDP: 4.0000 mg | ORAL_TABLET | Freq: Three times a day (TID) | ORAL | 0 refills | Status: AC | PRN

## 2024-01-19 MED ORDER — FAMOTIDINE IN NACL 20-0.9 MG/50ML-% IV SOLN
20.0000 mg | Freq: Once | INTRAVENOUS | Status: AC
  Administered 2024-01-19: 20 mg via INTRAVENOUS
  Filled 2024-01-19: qty 50

## 2024-01-19 MED ORDER — METHYLPREDNISOLONE SODIUM SUCC 125 MG IJ SOLR
125.0000 mg | Freq: Once | INTRAMUSCULAR | Status: AC
  Administered 2024-01-19: 125 mg via INTRAVENOUS
  Filled 2024-01-19: qty 2

## 2024-01-19 MED ORDER — POTASSIUM CHLORIDE CRYS ER 20 MEQ PO TBCR
40.0000 meq | EXTENDED_RELEASE_TABLET | Freq: Once | ORAL | Status: AC
  Administered 2024-01-19: 40 meq via ORAL
  Filled 2024-01-19: qty 2

## 2024-01-19 MED ORDER — IOHEXOL 300 MG/ML  SOLN
100.0000 mL | Freq: Once | INTRAMUSCULAR | Status: AC | PRN
  Administered 2024-01-19: 100 mL via INTRAVENOUS

## 2024-01-19 MED ORDER — SODIUM CHLORIDE 0.9 % IV BOLUS
1000.0000 mL | Freq: Once | INTRAVENOUS | Status: AC
  Administered 2024-01-19: 1000 mL via INTRAVENOUS

## 2024-01-19 NOTE — Discharge Instructions (Addendum)
 Your evaluation did not show any serious causes for your vomiting and abdominal pain and I suspect that they are related to the allergic reaction which led to the rash.  You may take diphenhydramine  (Benadryl ) as needed.  Please take the prednisone  until it is all gone.  Also, your potassium was slightly low, probably from your episodes of vomiting.  Please take the potassium pills until they are gone.  Return to the emergency department if symptoms are getting worse.  Please discuss with your primary care provider whether it would be advisable to be evaluated by an allergist.

## 2024-01-19 NOTE — ED Triage Notes (Addendum)
 BIBA from home, reports abdominal pain that wraps around to her flank at 7/10, full body rash/hives endorses vomiting and heartburn.   120/71 HR 95 O2 95% CBG 95

## 2024-01-19 NOTE — ED Provider Notes (Signed)
 [facility] AT [facility] Provider Note   CSN: [id number] Arrival date & time: 01/19/24  9849     Patient presents with: No chief complaint on file.   Mallory Craig is a 33 y.o. female.   The history is provided by the patient and the EMS personnel.  She came in by ambulance because of vomiting today.  She is vomited numerous times.  She also broke out in a generalized pruritic rash.  She had taken diphenhydramine  at home with slight improvement.  EMS administered epinephrine , diphenhydramine , IV fluids, and withdrawn.  Blood pressure was initially borderline low and had improved.  Patient states that she is feeling somewhat better following that treatment.   Prior to Admission medications   Medication Sig Start Date End Date Taking? Authorizing Provider  FLUoxetine  (PROZAC ) 10 MG capsule Take 1 capsule (10 mg total) by mouth daily. Patient not taking: Reported on 01/09/2024 05/17/23   Carlin Rollene HERO, CNM  levonorgestrel  (MIRENA ) 20 MCG/DAY IUD 1 each by Intrauterine route once.    [provider]  misoprostol  (CYTOTEC ) 200 MCG tablet Place 1 tablet (200 mcg total) vaginally once for 1 dose. At bedtime evening prior to procedure Patient not taking: Reported on 01/09/2024 05/17/23 05/17/23  Carlin Rollene HERO, CNM  norethindrone  (AYGESTIN ) 5 MG tablet Take 1 tablet (5 mg total) by mouth 2 (two) times daily for 21 days. As directed Patient not taking: Reported on 01/09/2024 12/10/23 12/31/23  Slaughterbeck, Damien, CNM  sulfamethoxazole -trimethoprim  (BACTRIM  DS) 800-160 MG tablet Take 1 tablet by mouth 2 (two) times daily. 01/09/24   Slaughterbeck, Damien, CNM    Allergies: Tape and Bee venom    Review of Systems  All other systems reviewed and are negative.   Updated Vital Signs BP 122/65   Pulse 96   Temp 98 F (36.7 C)   Resp 17   LMP  (LMP Unknown)   SpO2 99%   Physical Exam Vitals and nursing note reviewed.   33 year old female,  resting comfortably and in no acute distress. Vital signs are normal. Oxygen saturation is 99%, which is normal. Head is normocephalic and atraumatic. PERRLA, EOMI. Oropharynx is clear.  She has normal phonation, no stridor. Lungs are clear without rales, wheezes, or rhonchi. Chest is nontender. Heart has regular rate and rhythm without murmur. Abdomen is soft, flat, with mild to moderate tenderness diffusely.  There is no rebound or guarding. Extremities have no cyanosis or edema, full range of motion is present. Skin is warm and dry.  Generalized maculopapular rash is present with some areas appearing to be urticarial. Neurologic: Awake and alert, moves all extremities equally.  (all labs ordered are listed, but only abnormal results are displayed) Labs Reviewed  COMPREHENSIVE METABOLIC PANEL WITH GFR - Abnormal; Notable for the following components:      Result Value   Potassium 3.1 (*)    CO2 21 (*)    Glucose, Bld 151 (*)    Calcium  8.5 (*)    Total Bilirubin 1.6 (*)    All other components within normal limits  CBC WITH DIFFERENTIAL/PLATELET - Abnormal; Notable for the following components:   WBC 13.3 (*)    Neutro Abs 11.2 (*)    All other components within normal limits  URINALYSIS, ROUTINE W REFLEX MICROSCOPIC - Abnormal; Notable for the following components:   APPearance HAZY (*)    Hgb urine dipstick MODERATE (*)    Ketones, ur 5 (*)    Leukocytes,Ua  SMALL (*)    Bacteria, UA RARE (*)    All other components within normal limits  LIPASE, BLOOD  HCG, SERUM, QUALITATIVE    Radiology: CT ABDOMEN PELVIS W CONTRAST Result Date: 01/19/2024 CLINICAL DATA:  Abdominal pain, acute. EXAM: CT ABDOMEN AND PELVIS WITH CONTRAST TECHNIQUE: Multidetector CT imaging of the abdomen and pelvis was performed using the standard protocol following bolus administration of intravenous contrast. RADIATION DOSE REDUCTION: This exam was performed according to the departmental dose-optimization  program which includes automated exposure control, adjustment of the mA and/or kV according to patient size and/or use of iterative reconstruction technique. CONTRAST:  OMNIPAQUE  IOHEXOL  300 MG/ML  SOLN COMPARISON:  11/08/2017 FINDINGS: Lower chest: No acute abnormality. Hepatobiliary: No focal liver abnormality is seen. No gallstones, gallbladder wall thickening, or biliary dilatation. Pancreas: Unremarkable. No pancreatic ductal dilatation or surrounding inflammatory changes. Spleen: Normal in size without focal abnormality. Adrenals/Urinary Tract: Adrenal glands are unremarkable. Kidneys are normal, without renal calculi, focal lesion, or hydronephrosis. Bladder is unremarkable. Stomach/Bowel: Stomach is within normal limits. Appendix appears normal. No evidence of bowel wall thickening, distention, or inflammatory changes. Vascular/Lymphatic: No significant vascular findings are present. No enlarged abdominal or pelvic lymph nodes. Reproductive: Retroverted uterus containing IUD. Right ovary corpus luteal cyst. Increased parametrial vascularity is identified bilaterally. Other: Small fat containing right inguinal and umbilical hernias. No free fluid or fluid collections. No free air. Musculoskeletal: No acute or significant osseous findings. IMPRESSION: 1. No acute findings within the abdomen or pelvis. 2. Increased parametrial vascularity is identified bilaterally. This is a nonspecific finding but can be seen in the setting of pelvic congestion syndrome. 3. Right ovary corpus luteal cyst. 4. Small fat containing right inguinal and umbilical hernias. Electronically Signed   By: Waddell Calk M.D.   On: 01/19/2024 06:26     Procedures  Cardiac monitor shows normal sinus rhythm, per my interpretation. Medications Ordered in the ED  sodium chloride  0.9 % bolus 1,000 mL (0 mLs Intravenous Stopped 01/19/24 0251)  famotidine  (PEPCID ) IVPB 20 mg premix (0 mg Intravenous Stopped 01/19/24 0251)  potassium  chloride SA (KLOR-CON  M) CR tablet 40 mEq (40 mEq Oral Given 01/19/24 0314)  methylPREDNISolone  sodium succinate (SOLU-MEDROL ) 125 mg/2 mL injection 125 mg (125 mg Intravenous Given 01/19/24 0544)  iohexol  (OMNIPAQUE ) 300 MG/ML solution 100 mL (100 mLs Intravenous Contrast Given 01/19/24 0547)                                    Medical Decision Making Amount and/or Complexity of Data Reviewed Labs: ordered. Radiology: ordered.  Risk Prescription drug management.   Nausea and vomiting which may be from bile gastritis, doubt serious abdominal pathology such as bowel obstruction or diverticulitis or appendicitis.  Generalized rash which seems most likely to be allergic although specific trigger is not clear.  This is a presentation with wide range of treatment options and carries with it a high risk of morbidity and complications.  I have initiated laboratory workup, IV fluids, ondansetron  for nausea and famotidine  to provide H2 blockade.  She does not appear to be in anaphylactic shock at this point but will need to be observed closely.  5:24 AM Patient is resting comfortably.  Itching has improved and rash is fading but still present.  However, she is still complaining of significant abdominal pain.  I have reviewed her laboratory tests, my interpretation is mild leukocytosis which is  nonspecific, hypokalemia likely secondary to epinephrine  administration but I have ordered oral potassium, elevated random glucose level which will need to be followed as an outpatient, normal transaminases, mildly elevated total bilirubin possibly secondary to Gilbert's disease.  Because of ongoing abdominal pain, I have ordered CT of abdomen and pelvis.  CT scan shows no acute process.  Have independently viewed the images, and agree with the radiologist's interpretation.  I feel she is safe for discharge.  I am discharging her with prescription for prednisone , oral potassium, and ondansetron  oral dissolving tablet.   I have instructed her to discuss with her primary care provider whether allergist evaluation would be appropriate.  Return precautions discussed.     Final diagnoses:  Allergic reaction, initial encounter  Generalized abdominal pain  Hypokalemia  Elevated random blood glucose level  Nausea and vomiting, unspecified vomiting type  Serum total bilirubin elevated    ED Discharge Orders          Ordered    ondansetron  (ZOFRAN -ODT) 4 MG disintegrating tablet  Every 8 hours PRN        01/19/24 0653    potassium chloride  SA (KLOR-CON  M) 20 MEQ tablet  2 times daily        01/19/24 0653    predniSONE  (DELTASONE ) 50 MG tablet  Daily        01/19/24 0653               Raford Lenis, MD 01/19/24 [phone]

## 2024-03-02 ENCOUNTER — Encounter: Payer: Self-pay | Admitting: Obstetrics and Gynecology

## 2024-03-20 NOTE — Patient Instructions (Incomplete)
 Preventive Care 28-31 Years Old, Female  Preventive care refers to lifestyle choices and visits with your health care provider that can promote health and wellness. Preventive care visits are also called wellness exams. What can I expect for my preventive care visit? Counseling During your preventive care visit, your health care provider may ask about your: Medical history, including: Past medical problems. Family medical history. Pregnancy history. Current health, including: Menstrual cycle. Method of birth control. Emotional well-being. Home life and relationship well-being. Sexual activity and sexual health. Lifestyle, including: Alcohol, nicotine or tobacco, and drug use. Access to firearms. Diet, exercise, and sleep habits. Work and work Astronomer. Sunscreen use. Safety issues such as seatbelt and bike helmet use. Physical exam Your health care provider may check your: Height and weight. These may be used to calculate your BMI (body mass index). BMI is a measurement that tells if you are at a healthy weight. Waist circumference. This measures the distance around your waistline. This measurement also tells if you are at a healthy weight and may help predict your risk of certain diseases, such as type 2 diabetes and high blood pressure. Heart rate and blood pressure. Body temperature. Skin for abnormal spots. What immunizations do I need?  Vaccines are usually given at various ages, according to a schedule. Your health care provider will recommend vaccines for you based on your age, medical history, and lifestyle or other factors, such as travel or where you work. What tests do I need? Screening Your health care provider may recommend screening tests for certain conditions. This may include: Pelvic exam and Pap test. Lipid and cholesterol levels. Diabetes screening. This is done by checking your blood sugar (glucose) after you have not eaten for a while  (fasting). Hepatitis B test. Hepatitis C test. HIV (human immunodeficiency virus) test. STI (sexually transmitted infection) testing, if you are at risk. BRCA-related cancer screening. This may be done if you have a family history of breast, ovarian, tubal, or peritoneal cancers. Talk with your health care provider about your test results, treatment options, and if necessary, the need for more tests. Follow these instructions at home: Eating and drinking  Eat a healthy diet that includes fresh fruits and vegetables, whole grains, lean protein, and low-fat dairy products. Take vitamin and mineral supplements as recommended by your health care provider. Do not drink alcohol if: Your health care provider tells you not to drink. You are pregnant, may be pregnant, or are planning to become pregnant. If you drink alcohol: Limit how much you have to 0-1 drink a day. Know how much alcohol is in your drink. In the [place] one drink equals one 12 oz bottle of beer (355 mL), one 5 oz glass of wine (148 mL), or one 1 oz glass of hard liquor (44 mL). Lifestyle Brush your teeth every morning and night with fluoride toothpaste. Floss one time each day. Exercise for at least 30 minutes 5 or more days each week. Do not use any products that contain nicotine or tobacco. These products include cigarettes, chewing tobacco, and vaping devices, such as e-cigarettes. If you need help quitting, ask your health care provider. Do not use drugs. If you are sexually active, practice safe sex. Use a condom or other form of protection to prevent STIs. If you do not wish to become pregnant, use a form of birth control. If you plan to become pregnant, see your health care provider for a prepregnancy visit. Find healthy ways to manage stress, such as:  Meditation, yoga, or listening to music. Journaling. Talking to a trusted person. Spending time with friends and family. Minimize exposure to UV radiation to reduce your  risk of skin cancer. Safety Always wear your seat belt while driving or riding in a vehicle. Do not drive: If you have been drinking alcohol. Do not ride with someone who has been drinking. If you have been using any mind-altering substances or drugs. While texting. When you are tired or distracted. Wear a helmet and other protective equipment during sports activities. If you have firearms in your house, make sure you follow all gun safety procedures. Seek help if you have been physically or sexually abused. What's next? Go to your health care provider once a year for an annual wellness visit. Ask your health care provider how often you should have your eyes and teeth checked. Stay up to date on all vaccines. This information is not intended to replace advice given to you by your health care provider. Make sure you discuss any questions you have with your health care provider. Document Revised: 12/14/2020 Document Reviewed: 12/14/2020 Elsevier Patient Education  2024 Elsevier Inc.     How to Do a Breast Self-Exam Doing breast self-exams can help you stay healthy. They're one way to know what's normal for your breasts. They can help you catch a problem while it's still small and can be treated. You need to: Check your breasts often. Tell your doctor about any changes. You should do breast self-exams even if you have breast implants. What you need: A mirror. A well-lit room. A pillow or other soft object. How to do a breast self-exam Look for changes  Take off all the clothes above your waist. Stand in front of a mirror in a room with good lighting. Put your hands down at your sides. Compare your breasts in the mirror. Look for difference between them, such as: Differences in shape. Differences in size. Wrinkles, dips, and bumps in one breast and not the other. Look at each breast for skin changes, such as: Redness. Scaly spots. Spots where your skin is  thicker. Dimpling. Open sores. Look for changes in your nipples, such as: Fluid coming out of a nipple. Fluid around a nipple. Bleeding. Dimpling. Redness. A nipple that looks pushed in or that has changed position. Feel for changes Lie on your back. Feel each breast. To do this: Pick a breast to feel. Place a pillow under the shoulder closest to that breast. Put the arm closest to that breast behind your head. Feel the breast using the hand of your other arm. Use the pads of your three middle fingers to make small circles starting near the nipple. Use light, medium, and firm pressure. Keep making circles, moving down over the breast. Stop when you feel your ribs. Start making circles with your fingers again, this time going up until you reach your collarbone. Then, make circles out across your breast and into your armpit area. Squeeze your nipple. Check for fluid and lumps. Do these steps again to check your other breast. Sit or stand in the tub or shower. With soapy water on your skin, feel each breast the same way you did when you were lying down. Write down what you find Writing down what you find can help you keep track of what you want to tell your doctor. Write down: What's normal for each breast. Any changes you find. Write down: The kind of change. If your breast feels tender or painful.  Any lump you find. Write down its size and where it is. When you last had your period. General tips If you're breastfeeding, the best time to check your breasts is after you feed your baby or after you use a breast pump. If you get a period, the best time to check your breasts is 5-7 days after your period ends. With time, you'll get more used to doing the self-exam. You'll also start to know if there are changes in your breasts. Contact a doctor if: You see a change in the shape or size of your breasts or nipples. You see a change in the skin of your breast or nipples. You have fluid  coming from your nipples that isn't normal. You find a new lump or thick area. You have breast pain. You have any concerns about your breast health. This information is not intended to replace advice given to you by your health care provider. Make sure you discuss any questions you have with your health care provider. Document Revised: 08/28/2023 Document Reviewed: 08/28/2023 Elsevier Patient Education  2025 [employer].

## 2024-03-20 NOTE — Progress Notes (Deleted)
 GYNECOLOGY ANNUAL PHYSICAL EXAM PROGRESS NOTE  Subjective:    Mallory Craig is a 33 y.o. [phone] female who presents for an annual exam.  The patient {is/is not/has never been:13135} sexually active. The patient participates in regular exercise: {yes/no/not asked:9010}. Has the patient Mallory been transfused or tattooed?: {yes/no/not asked:9010}. The patient reports that there {is/is not:9024} domestic violence in her life.   The patient has the following complaints today:   Menstrual History: Menarche age: *** No LMP recorded. (Menstrual status: IUD).     Gynecologic History:  Contraception: IUD History of STI's:  Last Pap: 08/14/2021. Results were: normal. Notes h/o abnormal pap smears. Last mammogram: Not age appropriate       OB History  Gravida Para Term Preterm AB Living  5 5 4 1  0 4  SAB IAB Ectopic Multiple Live Births  0 0 0 0 4    # Outcome Date GA Lbr Len/2nd Weight Sex Type Anes PTL Lv  5 Term 05/17/19 [redacted]w[redacted]d 13:54 / 00:31 7 lb 8.6 oz (3.42 kg) M Vag-Vacuum EPI  LIV     Name: Couchman,BOY Chinelo     Apgar1: 8  Apgar5: 9  4 Term 06/22/16 [redacted]w[redacted]d 221:51 / 00:33 7 lb 10.8 oz (3.48 kg) F Vag-Spont EPI  LIV     Birth Comments: none observed at delivery     Name: Devol,GIRL Hayat     Apgar1: 8  Apgar5: 9  3 Preterm 07/05/15 [redacted]w[redacted]d / 01:46 1 lb 13 oz (0.822 kg) M Vag-Spont EPI  FD     Name: Petitfrere,PENDINGBABY FD     Apgar1: 0  Apgar5: 0  2 Term 09/02/13 [redacted]w[redacted]d  7 lb 7 oz (3.374 kg) F Vag-Spont None N LIV  1 Term 01/23/12 [redacted]w[redacted]d  6 lb 6 oz (2.892 kg) F Vag-Spont EPI N LIV    Past Medical History:  Diagnosis Date   Allergy    Claritin   Anxiety    Asthma    Childhood; last Albuterol use age 65   Depression    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 first child    Past Surgical History:  Procedure Laterality Date   ADENOIDECTOMY     WISDOM TOOTH EXTRACTION      Family History  Problem Relation Age of Onset   Cancer Mother        skin cancer, 47s    Arthritis Mother    Hyperthyroidism Mother    Asthma Brother    Breast cancer Maternal Grandmother        17s   Cancer Maternal Grandmother        Lung   Cancer Maternal Grandfather    Breast cancer Maternal Great-grandmother        62s    Social History   Socioeconomic History   Marital status: Married    Spouse name: Not on file   Number of children: 4   Years of education: Not on file   Highest education level: 12th grade  Occupational History   Occupation: stay at home  Tobacco Use   Smoking status: Never   Smokeless tobacco: Never  Vaping Use   Vaping status: Never Used  Substance and Sexual Activity   Alcohol use: Yes    Alcohol/week: 0.0 standard drinks of alcohol    Comment: occasionally   Drug use: No   Sexual activity: Yes    Partners: Male    Birth control/protection: None, Spermicide  Other Topics Concern   Not on file  Social History  Narrative   Not on file   Social Drivers of Health   Financial Resource Strain: Low Risk  (07/21/2021)   Overall Financial Resource Strain (CARDIA)    Difficulty of Paying Living Expenses: Not hard at all  Food Insecurity: No Food Insecurity (07/21/2021)   Hunger Vital Sign    Worried About Running Out of Food in the Last Year: Never true    Ran Out of Food in the Last Year: Never true  Transportation Needs: No Transportation Needs (07/21/2021)   PRAPARE - Administrator, Civil Service (Medical): No    Lack of Transportation (Non-Medical): No  Physical Activity: Inactive (07/21/2021)   Exercise Vital Sign    Days of Exercise per Week: 0 days    Minutes of Exercise per Session: 0 min  Stress: Stress Concern Present (07/21/2021)   [employer] of Occupational Health - Occupational Stress Questionnaire    Feeling of Stress : To some extent  Social Connections: Moderately Isolated (07/21/2021)   Social Connection and Isolation Panel    Frequency of Communication with Friends and Family: Once a week     Frequency of Social Gatherings with Friends and Family: Once a week    Attends Religious Services: 1 to 4 times per year    Active Member of [employer] or Organizations: No    Attends Engineer, structural: Not on file    Marital Status: Married  Catering manager Violence: Not At Risk (07/21/2021)   Humiliation, Afraid, Rape, and Kick questionnaire    Fear of Current or Ex-Partner: No    Emotionally Abused: No    Physically Abused: No    Sexually Abused: No    Current Outpatient Medications on File Prior to Visit  Medication Sig Dispense Refill   acetaminophen  (TYLENOL ) 500 MG tablet Take 500 mg by mouth every 6 (six) hours as needed for moderate pain (pain score 4-6).     alum & mag hydroxide-simeth (MAALOX PLUS) 400-400-40 MG/5ML suspension Take 15 mLs by mouth every 6 (six) hours as needed for indigestion.     diphenhydrAMINE  (BENADRYL ) 25 MG tablet Take 25 mg by mouth every 6 (six) hours as needed for allergies.     FLUoxetine  (PROZAC ) 10 MG capsule Take 1 capsule (10 mg total) by mouth daily. (Patient not taking: No sig reported) 90 capsule 3   ibuprofen  (ADVIL ) 200 MG tablet Take 200 mg by mouth every 6 (six) hours as needed.     misoprostol  (CYTOTEC ) 200 MCG tablet Place 1 tablet (200 mcg total) vaginally once for 1 dose. At bedtime evening prior to procedure (Patient not taking: Reported on 01/09/2024) 1 tablet 0   norethindrone  (AYGESTIN ) 5 MG tablet Take 1 tablet (5 mg total) by mouth 2 (two) times daily for 21 days. As directed (Patient not taking: Reported on 01/09/2024) 42 tablet 0   ondansetron  (ZOFRAN -ODT) 4 MG disintegrating tablet Take 1 tablet (4 mg total) by mouth every 8 (eight) hours as needed for nausea or vomiting. 20 tablet 0   potassium chloride  SA (KLOR-CON  M) 20 MEQ tablet Take 1 tablet (20 mEq total) by mouth 2 (two) times daily. 10 tablet 0   predniSONE  (DELTASONE ) 50 MG tablet Take 1 tablet (50 mg total) by mouth daily. 5 tablet 0   sulfamethoxazole -trimethoprim   (BACTRIM  DS) 800-160 MG tablet Take 1 tablet by mouth 2 (two) times daily. (Patient not taking: Reported on 01/19/2024) 14 tablet 0   No current facility-administered medications on file prior to visit.  Allergies  Allergen Reactions   Tape Hives    Had an epidural and had a piece of tape used and caused hives.   Bee Venom Swelling     Review of Systems Constitutional: negative for chills, fatigue, fevers and sweats Eyes: negative for irritation, redness and visual disturbance Ears, nose, mouth, throat, and face: negative for hearing loss, nasal congestion, snoring and tinnitus Respiratory: negative for asthma, cough, sputum Cardiovascular: negative for chest pain, dyspnea, exertional chest pressure/discomfort, irregular heart beat, palpitations and syncope Gastrointestinal: negative for abdominal pain, change in bowel habits, nausea and vomiting Genitourinary: negative for abnormal menstrual periods, genital lesions, sexual problems and vaginal discharge, dysuria and urinary incontinence Integument/breast: negative for breast lump, breast tenderness and nipple discharge Hematologic/lymphatic: negative for bleeding and easy bruising Musculoskeletal:negative for back pain and muscle weakness Neurological: negative for dizziness, headaches, vertigo and weakness Endocrine: negative for diabetic symptoms including polydipsia, polyuria and skin dryness Allergic/Immunologic: negative for hay fever and urticaria      Objective:  There were no vitals taken for this visit. There is no height or weight on file to calculate BMI.    General Appearance:    Alert, cooperative, no distress, appears stated age  Head:    Normocephalic, without obvious abnormality, atraumatic  Eyes:    PERRL, conjunctiva/corneas clear, EOM's intact, both eyes  Ears:    Normal external ear canals, both ears  Nose:   Nares normal, septum midline, mucosa normal, no drainage or sinus tenderness  Throat:   Lips,  mucosa, and tongue normal; teeth and gums normal  Neck:   Supple, symmetrical, trachea midline, no adenopathy; thyroid: no enlargement/tenderness/nodules; no carotid bruit or JVD  Back:     Symmetric, no curvature, ROM normal, no CVA tenderness  Lungs:     Clear to auscultation bilaterally, respirations unlabored  Chest Wall:    No tenderness or deformity   Heart:    Regular rate and rhythm, S1 and S2 normal, no murmur, rub or gallop  Breast Exam:    No tenderness, masses, or nipple abnormality  Abdomen:     Soft, non-tender, bowel sounds active all four quadrants, no masses, no organomegaly.    Genitalia:    Pelvic:external genitalia normal, vagina without lesions, discharge, or tenderness, rectovaginal septum  normal. Cervix normal in appearance, no cervical motion tenderness, no adnexal masses or tenderness.  Uterus normal size, shape, mobile, regular contours, nontender.  Rectal:    Normal external sphincter.  No hemorrhoids appreciated. Internal exam not done.   Extremities:   Extremities normal, atraumatic, no cyanosis or edema  Pulses:   2+ and symmetric all extremities  Skin:   Skin color, texture, turgor normal, no rashes or lesions  Lymph nodes:   Cervical, supraclavicular, and axillary nodes normal  Neurologic:   CNII-XII intact, normal strength, sensation and reflexes throughout   .  Labs:  Lab Results  Component Value Date   WBC 13.3 (H) 01/19/2024   HGB 14.1 01/19/2024   HCT 42.6 01/19/2024   MCV 87.8 01/19/2024   PLT 180 01/19/2024    Lab Results  Component Value Date   CREATININE 0.92 01/19/2024   BUN 14 01/19/2024   NA 137 01/19/2024   K 3.1 (L) 01/19/2024   CL 105 01/19/2024   CO2 21 (L) 01/19/2024    Lab Results  Component Value Date   ALT 15 01/19/2024   AST 18 01/19/2024   ALKPHOS 43 01/19/2024   BILITOT 1.6 (H) 01/19/2024  Lab Results  Component Value Date   TSH 1.586 10/11/2014     Assessment:   No diagnosis found.   Plan:  Blood  tests: {blood tests:13147}. Breast self exam technique reviewed and patient encouraged to perform self-exam monthly. Contraception: {contraceptive methods:5051}. Discussed healthy lifestyle modifications. Mammogram {discussed/ordered:14545} Pap smear {discussed/ordered:14545}. Flu vaccine: Follow up in 1 year for annual exam   Kizzie Camelia CROME, CMA [facility]

## 2024-03-23 ENCOUNTER — Ambulatory Visit: Admitting: Certified Nurse Midwife

## 2024-07-15 ENCOUNTER — Encounter: Payer: Self-pay | Admitting: Obstetrics and Gynecology
